# Patient Record
Sex: Female | Born: 1945 | Race: White | Hispanic: No | Marital: Married | Smoking: Never smoker
Health system: Southern US, Community
[De-identification: ages and names within clinical notes are randomized; demographics above are authoritative.]

## PROBLEM LIST (undated history)

## (undated) DIAGNOSIS — K604 Rectal fistula, unspecified: Secondary | ICD-10-CM

## (undated) DIAGNOSIS — K5792 Diverticulitis of intestine, part unspecified, without perforation or abscess without bleeding: Secondary | ICD-10-CM

## (undated) DIAGNOSIS — G43909 Migraine, unspecified, not intractable, without status migrainosus: Secondary | ICD-10-CM

## (undated) DIAGNOSIS — K219 Gastro-esophageal reflux disease without esophagitis: Secondary | ICD-10-CM

## (undated) DIAGNOSIS — D126 Benign neoplasm of colon, unspecified: Secondary | ICD-10-CM

## (undated) DIAGNOSIS — J302 Other seasonal allergic rhinitis: Secondary | ICD-10-CM

## (undated) DIAGNOSIS — M810 Age-related osteoporosis without current pathological fracture: Secondary | ICD-10-CM

## (undated) DIAGNOSIS — D333 Benign neoplasm of cranial nerves: Secondary | ICD-10-CM

## (undated) DIAGNOSIS — I10 Essential (primary) hypertension: Secondary | ICD-10-CM

## (undated) DIAGNOSIS — A0472 Enterocolitis due to Clostridium difficile, not specified as recurrent: Secondary | ICD-10-CM

## (undated) HISTORY — DX: Other seasonal allergic rhinitis: J30.2

## (undated) HISTORY — DX: Age-related osteoporosis without current pathological fracture: M81.0

## (undated) HISTORY — DX: Rectal fistula, unspecified: K60.40

## (undated) HISTORY — DX: Diverticulitis of intestine, part unspecified, without perforation or abscess without bleeding: K57.92

## (undated) HISTORY — DX: Enterocolitis due to Clostridium difficile, not specified as recurrent: A04.72

## (undated) HISTORY — DX: Benign neoplasm of cranial nerves: D33.3

## (undated) HISTORY — DX: Gastro-esophageal reflux disease without esophagitis: K21.9

## (undated) HISTORY — DX: Essential (primary) hypertension: I10

## (undated) HISTORY — PX: OOPHORECTOMY: SHX86

## (undated) HISTORY — PX: APPENDECTOMY: SHX54

## (undated) HISTORY — DX: Migraine, unspecified, not intractable, without status migrainosus: G43.909

## (undated) HISTORY — DX: Benign neoplasm of colon, unspecified: D12.6

## (undated) HISTORY — PX: ABDOMINAL HYSTERECTOMY: SHX81

## (undated) HISTORY — DX: Rectal fistula: K60.4

## (undated) HISTORY — PX: TONSILLECTOMY: SUR1361

---

## 2004-05-15 ENCOUNTER — Other Ambulatory Visit: Admission: RE | Admit: 2004-05-15 | Discharge: 2004-05-15 | Payer: Self-pay | Admitting: Gynecology

## 2006-08-05 ENCOUNTER — Other Ambulatory Visit: Admission: RE | Admit: 2006-08-05 | Discharge: 2006-08-05 | Payer: Self-pay | Admitting: Gynecology

## 2010-03-21 ENCOUNTER — Encounter: Admission: RE | Admit: 2010-03-21 | Discharge: 2010-03-21 | Payer: Self-pay | Admitting: Gynecology

## 2011-03-04 ENCOUNTER — Ambulatory Visit (INDEPENDENT_AMBULATORY_CARE_PROVIDER_SITE_OTHER): Payer: BLUE CROSS/BLUE SHIELD | Admitting: Gastroenterology

## 2011-03-04 ENCOUNTER — Encounter: Payer: Self-pay | Admitting: Gastroenterology

## 2011-03-04 VITALS — BP 121/78 | HR 80 | Temp 98.5°F | Ht 68.0 in | Wt 203.8 lb

## 2011-03-04 DIAGNOSIS — K5732 Diverticulitis of large intestine without perforation or abscess without bleeding: Secondary | ICD-10-CM

## 2011-03-04 DIAGNOSIS — K5792 Diverticulitis of intestine, part unspecified, without perforation or abscess without bleeding: Secondary | ICD-10-CM

## 2011-03-04 MED ORDER — PEG 3350-KCL-NA BICARB-NACL 420 G PO SOLR: ORAL | Status: AC

## 2011-03-04 NOTE — Patient Instructions (Signed)
Follow a high fiber diet Avoid constipation Stay away from nuts, seeds, etc. We have set you up for a colonoscopy with Dr. Jena Gauss

## 2011-03-04 NOTE — Progress Notes (Signed)
Referring Provider: Dr. Sherril Croon Primary Care Physician:  Ignatius Specking., MD, MD Primary Gastroenterologist:  Dr. Jena Gauss  Chief Complaint  Patient presents with  . Diverticulitis    diagnosed 2003    HPI:  Kaitlin May is a 65 y.o. female here as a referral from Dr. Jena Gauss for for hx of multiple episodes of diverticulitis. Clinically diagnosed in June 2011, then another occurrence in March, 2012. Last episode had RLQ pain, hurt with movement, then moved towards ruq. Time before that LLQ. Associated with diarrhea, felt like constipated, mucousy. No fevers. No brbpr, no melena. Had a CT last year and year before. Last two times saw Dr. Sherril Croon, just took abx. Did get better. Has difficulty with constipation. Uses glycerin suppositoires. Was taking prune juice, which helped. Metamucil just started a few days ago,helped.  Pain has improved significantly since given abx for recent flare: January 31, 2011.  Last colonoscopy by Dr. Gabriel Cirri March 2004 : sigmoid diverticula  Past Medical History  Diagnosis Date  . Acoustic neuroma     in situ  . HTN (hypertension)   . Migraines     uses demerol and phenergan for migraines due to severity of pain, nausea  . Seasonal allergies   . GERD (gastroesophageal reflux disease)     Past Surgical History  Procedure Date  . Partial hysterectomy   . Tonsillectomy   . Appendectomy   . 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 butalbital-acetaminophen-caffeine (FIORICET, ESGIC) 50-325-40 MG per tablet Take 2 tablets by mouth as needed.        . cetirizine (ZYRTEC) 10 MG chewable tablet Chew 10 mg by mouth as needed.        . fluticasone (FLONASE) 50 MCG/ACT nasal spray 1 spray by Nasal route daily.        Marland Kitchen LORazepam (ATIVAN) 1 MG tablet Take 1 mg by mouth daily as needed.        . meperidine (DEMEROL) 50 MG tablet Take 50 mg by mouth every 4 (four)  hours as needed.        . promethazine (PHENERGAN) 25 MG suppository Place 25 mg rectally every 6 (six) hours as needed.        . lansoprazole (PREVACID) 30 MG capsule Take 30 mg by mouth daily.          Allergies as of 03/04/2011 - Review Complete 03/04/2011  Allergen Reaction Noted  . Penicillins Palpitations 03/04/2011    Family History  Problem Relation Age of Onset  . Melanoma Father     deceased at 79  . Diabetes Mother   . Dementia Mother     deceased at 5  . Colon cancer Neg Hx     History   Social History  . Marital Status: Married    Spouse Name: N/A    Number of Children: N/A  . Years of Education: N/A   Occupational History  . Not on file.   Social History Main Topics  . Smoking status: Never Smoker   . Smokeless tobacco: Never Used  . Alcohol Use: No  . Drug Use: No  . Sexually Active: Not Currently -- Female partner(s)    Birth Control/ Protection: None     spouse     Review of Systems: Gen: Denies any fever, chills, sweats, anorexia, fatigue, weakness, malaise, weight loss, and sleep disorder CV: Denies chest  pain, angina, palpitations, syncope, orthopnea, PND, peripheral edema, and claudication. Resp: Denies dyspnea at rest, dyspnea with exercise, cough, sputum, wheezing, coughing up blood, and pleurisy. GI: Denies vomiting blood, jaundice, and fecal incontinence.   Denies dysphagia or odynophagia. GU : Denies urinary burning, blood in urine, urinary frequency, urinary hesitancy, nocturnal urination, and urinary incontinence. MS: Denies joint pain, limitation of movement, and swelling, stiffness, low back pain, extremity pain. Denies muscle weakness, cramps, atrophy.  Derm: Denies rash, itching, dry skin, hives, moles, warts, or unhealing ulcers.  Psych: Denies depression, anxiety, memory loss, suicidal ideation, hallucinations, paranoia, and confusion. Heme: Denies bruising, bleeding, and enlarged lymph nodes.  Physical Exam: BP 121/78  Pulse 80   Temp 98.5 F (36.9 C)  Ht 5\' 8"  (1.727 m)  Wt 203 lb 12.8 oz (92.443 kg)  BMI 30.99 kg/m2 General:   Alert,  Well-developed, well-nourished, pleasant and cooperative in NAD Head:  Normocephalic and atraumatic. Eyes:  Sclera clear, no icterus.   Conjunctiva pink. Ears:  Normal auditory acuity. Nose:  No deformity, discharge,  or lesions. Mouth:  No deformity or lesions, dentition normal. Neck:  Supple; no masses or thyromegaly. Lungs:  Clear throughout to auscultation.   No wheezes, crackles, or rhonchi. No acute distress. Heart:  Regular rate and rhythm; no murmurs, clicks, rubs,  or gallops. Abdomen:  Soft, very mildly tender RLA and nondistended. No masses, hepatosplenomegaly or hernias noted. Normal bowel sounds, without guarding, and without rebound.   Rectal:  Deferred until time of colonoscopy.   Msk:  Symmetrical without gross deformities. Normal posture. Extremities:  Without clubbing or edema. Neurologic:  Alert and  oriented x4;  grossly normal neurologically. Skin:  Intact without significant lesions or rashes. Cervical Nodes:  No significant cervical adenopathy. Psych:  Alert and cooperative. Normal mood and affect.

## 2011-03-07 DIAGNOSIS — K5792 Diverticulitis of intestine, part unspecified, without perforation or abscess without bleeding: Secondary | ICD-10-CM | POA: Insufficient documentation

## 2011-03-07 NOTE — Assessment & Plan Note (Signed)
Multiple episodes in past of diverticulitis, most recent episode mid-March. Significantly improved with abx. Colonoscopy in 2004 with Dr. Gabriel Cirri with findings of sigmoid diverticula. Doing well currently; now needs updated colonoscopy to r/o any underlying processes. Will proceed with TCS with Dr. Jena Gauss. High fiber diet, avoid constipation.   Proceed with TCS with Dr. Jena Gauss in near future: the risks, benefits, and alternatives have been discussed with the patient in detail. The patient states understanding and desires to proceed.

## 2011-03-08 NOTE — Progress Notes (Signed)
Cc to PCP 

## 2011-03-25 ENCOUNTER — Other Ambulatory Visit: Payer: Self-pay | Admitting: Internal Medicine

## 2011-03-25 ENCOUNTER — Ambulatory Visit (HOSPITAL_COMMUNITY)
Admission: RE | Admit: 2011-03-25 | Discharge: 2011-03-25 | Disposition: A | Discharge status: Home / Self Care | Payer: BC Managed Care – PPO | Source: Ambulatory Visit | Attending: Internal Medicine | Admitting: Internal Medicine

## 2011-03-25 ENCOUNTER — Encounter: Payer: BC Managed Care – PPO | Admitting: Internal Medicine

## 2011-03-25 DIAGNOSIS — I10 Essential (primary) hypertension: Secondary | ICD-10-CM | POA: Insufficient documentation

## 2011-03-25 DIAGNOSIS — K5732 Diverticulitis of large intestine without perforation or abscess without bleeding: Secondary | ICD-10-CM

## 2011-03-25 DIAGNOSIS — D126 Benign neoplasm of colon, unspecified: Secondary | ICD-10-CM

## 2011-03-25 DIAGNOSIS — Z79899 Other long term (current) drug therapy: Secondary | ICD-10-CM | POA: Insufficient documentation

## 2011-03-25 DIAGNOSIS — Z792 Long term (current) use of antibiotics: Secondary | ICD-10-CM

## 2011-03-25 DIAGNOSIS — K5730 Diverticulosis of large intestine without perforation or abscess without bleeding: Secondary | ICD-10-CM | POA: Insufficient documentation

## 2011-03-25 HISTORY — PX: COLONOSCOPY: SHX174

## 2011-03-25 HISTORY — DX: Benign neoplasm of colon, unspecified: D12.6

## 2011-03-26 NOTE — Op Note (Signed)
  NAME:  Kaitlin May, Kaitlin May              ACCOUNT NO.:  [id number]  MEDICAL RECORD NO.:  [id number]           PATIENT TYPE:  O  LOCATION:  [facility]                          FACILITY:  [facility]  PHYSICIAN:  R. Roetta Sessions, M.D. DATE OF BIRTH:  10/26/1946  DATE OF PROCEDURE:  03/25/2011 DATE OF DISCHARGE:                              OPERATIVE REPORT   PROCEDURE:  Colonoscopy with biopsy.  INDICATIONS FOR PROCEDURE:  A 65 year old lady with history of recurrent diverticulitis, been treated with multiple rounds of antibiotics over time.  She has had multiple CTs however, it is not clear to me whether or not diverticulitis was ever documented.  Last colonoscopy in 2004. She was seen in our office on March 04, 2011.  She was started on daily Metamucil based on office visit recommendations associated with marked improvement in bowel function and [name], tells me she is feeling very well these days.  No family history of colon polyps or colon cancer.  Colonoscopy now being done.  Risks, benefits, limitations, alternatives, imponderables have been discussed, questions answered. Please see the documentation in the medical record.  PROCEDURE NOTE:  O2 saturation, blood pressure, pulse, respirations were monitored throughout the entire procedure.  CONSCIOUS SEDATION:  Versed 6 mg IV, Demerol 100 mg IV in divided doses.  INSTRUMENT:  Pentax video chip system.  FINDINGS:  Digital rectal exam revealed no abnormalities.  Endoscopic findings:  Prep was adequate.  Colon:  Colonic mucosa was surveyed from the rectosigmoid junction through the left transverse right colon to the appendiceal orifice, ileocecal valve/cecum.  These structures were well seen and photographed for the record.  From this level, scope was slowly and cautiously withdrawn.  All previously mentioned mucosal surfaces were again seen.  The patient had extensive left-sided diverticula. There were two diminutive polyps in the  descending segments were cold biopsied/removed.  Remainder of colonic mucosa appeared normal.  Scope was pulled down into rectum where a thorough examination of rectal mucosa including retroflexed view of the anal verge demonstrated no abnormalities.  The patient tolerated the procedure well.  Cecal withdrawal time 8 minutes.  IMPRESSION: 1. Normal rectum. 2. Left-sided diverticula, diminutive ascending colon polyp with     status post cold biopsy removal.  Remainder of colonic mucosa     appeared normal.  RECOMMENDATIONS: 1. Continue substantial daily fiber supplement with Metamucil     indefinitely. 2. Follow up on path. 3. Further recommendations to follow.     Jonathon Bellows, M.D.     RMR/MEDQ  D:  03/25/2011  T:  03/25/2011  Job:  [id number]  cc:   Doreen Beam, MD Fax: [phone]  Electronically Signed by Lorrin Goodell M.D. on 03/26/2011 10:57:49 AM

## 2011-10-30 ENCOUNTER — Ambulatory Visit
Admission: RE | Admit: 2011-10-30 | Discharge: 2011-10-30 | Disposition: A | Discharge status: Home / Self Care | Payer: BC Managed Care – PPO | Source: Ambulatory Visit | Attending: Gynecology | Admitting: Gynecology

## 2011-10-30 ENCOUNTER — Other Ambulatory Visit: Payer: Self-pay | Admitting: Gynecology

## 2011-10-30 DIAGNOSIS — Z1231 Encounter for screening mammogram for malignant neoplasm of breast: Secondary | ICD-10-CM

## 2012-03-24 ENCOUNTER — Ambulatory Visit (INDEPENDENT_AMBULATORY_CARE_PROVIDER_SITE_OTHER): Payer: BC Managed Care – PPO | Admitting: Gastroenterology

## 2012-03-24 ENCOUNTER — Encounter: Payer: Self-pay | Admitting: Gastroenterology

## 2012-03-24 VITALS — BP 112/78 | HR 77 | Temp 97.6°F | Ht 68.0 in | Wt 206.6 lb

## 2012-03-24 DIAGNOSIS — K5792 Diverticulitis of intestine, part unspecified, without perforation or abscess without bleeding: Secondary | ICD-10-CM

## 2012-03-24 DIAGNOSIS — R109 Unspecified abdominal pain: Secondary | ICD-10-CM

## 2012-03-24 DIAGNOSIS — R1030 Lower abdominal pain, unspecified: Secondary | ICD-10-CM

## 2012-03-24 DIAGNOSIS — K5732 Diverticulitis of large intestine without perforation or abscess without bleeding: Secondary | ICD-10-CM

## 2012-03-24 DIAGNOSIS — R197 Diarrhea, unspecified: Secondary | ICD-10-CM | POA: Insufficient documentation

## 2012-03-24 NOTE — Progress Notes (Signed)
Faxed to PCP

## 2012-03-24 NOTE — Assessment & Plan Note (Signed)
History of recurrent diverticulitis, sounds like mostly clinical diagnosis. I will request copies of her prior CT scans done at [facility] for the past 15 years. We need to document how any episodes of diverticulitis she has had CT evidence of. Clearly her most recent GI symptoms likely not acute diverticulitis.

## 2012-03-24 NOTE — Progress Notes (Signed)
Primary Care Physician: Ignatius Specking., MD, MD  Primary Gastroenterologist:  Roetta Sessions, MD   Chief Complaint  Patient presents with  . Abdominal Pain    2 weeks ago  . Diarrhea    2 weeks ago   . Rectal Bleeding    2 weeks ago    HPI: Kaitlin May is a 66 y.o. female here for further evaluation of recent acute onset diarrhea and lower abdominal pain. She has h/o recurrent diverticulitis, clinically diagnosed to best of her knowledge. Recent CT A/P failed to show any colon wall thickening or diverticulitis.   She works at [employer]. 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 Several workers became ill as well. She did not get sick at that time. However, two weeks ago, stomach griping and hourly diarrhea throughout night. Started on April 23rd, Tuesday night. Saw Dr. Sherril Croon on the 24th, given Levaquin for suspected diverticulitis. Migraine headache started and lots of heaving. She ended up in ED.  Did CT Abd while there. No diverticulitis or anything to explain symptoms. Had not started Levaquin at that time, but went ahead and started after ED visit. Diarrhea lasted for over one week. Pain persistent and unable to eat solid foods. Medication changed to augmentin on 03/18/12 for ?infectious colitis. Patient given levsin (patient did not use). Last Thursday appetite returning. Some brbpr on toilet tissue, bloody mucous. Mostly lower abdominal pain. Stools are more formed. Chronically has to use glycerin suppository to get stool started. No more bleeding. Pain is better. Has multiple questions of whether her abdominal pain was related to her right ovary, hernia. Multiple questions about possibility of cystocele, rectocele.   Current Outpatient Prescriptions  Medication Sig Dispense Refill  . butalbital-acetaminophen-caffeine (FIORICET, ESGIC) 50-325-40 MG per tablet Take 2 tablets by mouth as needed.        . cetirizine (ZYRTEC) 10 MG chewable  tablet Chew 10 mg by mouth as needed.        . fluticasone (FLONASE) 50 MCG/ACT nasal spray 1 spray by Nasal route daily.        Marland Kitchen glycerin adult (GLYCERIN ADULT) 2 G SUPP Place 1 suppository rectally once as needed.      . lansoprazole (PREVACID) 30 MG capsule Take 30 mg by mouth daily.        Marland Kitchen LORazepam (ATIVAN) 1 MG tablet Take 1 mg by mouth daily as needed.        . meperidine (DEMEROL) 50 MG tablet Take 50 mg by mouth every 4 (four) hours as needed.        . promethazine (PHENERGAN) 25 MG suppository Place 25 mg rectally every 6 (six) hours as needed.        . psyllium (METAMUCIL) 58.6 % packet Take 1 packet by mouth daily.        Allergies as of 03/24/2012 - Review Complete 03/24/2012  Allergen Reaction Noted  . Penicillins Palpitations 03/04/2011    ROS:  General: Negative for anorexia, weight loss, fever, chills, fatigue, weakness. ENT: Negative for hoarseness, difficulty swallowing , nasal congestion. CV: Negative for chest pain, angina, palpitations, dyspnea on exertion, peripheral edema.  Respiratory: Negative for dyspnea at rest, dyspnea on exertion, cough, sputum, wheezing.  GI: See history of present illness. GU:  Negative for dysuria, hematuria, urinary incontinence, urinary frequency, nocturnal urination.  Endo: Negative for unusual weight change.    Physical Examination:   BP 112/78  Pulse 77  Temp(Src) 97.6 F (36.4  C) (Temporal)  Ht 5\' 8"  (1.727 m)  Wt 206 lb 9.6 oz (93.713 kg)  BMI 31.41 kg/m2  General: Well-nourished, well-developed in no acute distress. Accompanied by husband. Eyes: No icterus. Mouth: Oropharyngeal mucosa moist and pink , no lesions erythema or exudate. Lungs: Clear to auscultation bilaterally.  Heart: Regular rate and rhythm, no murmurs rubs or gallops.  Abdomen: Bowel sounds are normal, mild lower bowel tenderness more on the right side, nondistended, no hepatosplenomegaly or masses, no abdominal bruits or hernia , no rebound or  guarding.   Extremities: No lower extremity edema. No clubbing or deformities. Neuro: Alert and oriented x 4   Skin: Warm and dry, no jaundice.   Psych: Alert and cooperative, normal mood and affect.  Labs:  Requested records from [facility]  Imaging Studies: CT of abdomen and pelvis with contrast at [facility] on 03/11/2012 showed fatty liver, diverticulosis of the sigmoid colon without diverticulitis.

## 2012-03-24 NOTE — Assessment & Plan Note (Signed)
Recent acute onset diarrhea associated with lower abdominal discomfort, couple episodes of small-volume bright red blood noted on the toilet tissue towards the end of the illness. Diarrhea last for about one week. Anorexia resolved after 10 days. She was treated initially with Levaquin for suspected diverticulitis although her CT scan done in the emergency department was unremarkable. With protracted symptoms she was switched to Augmentin. At this point she is slowly returning to normal. I suspect she had a viral gastroenteritis. Cannot rule out infectious colitis but at least her colon appeared normal on CT suggesting self-limited disease. At this point I do not feel that she needs to have a colonoscopy. She had one 1 year ago. Her symptoms are resolving. If she were to have persistent diarrhea, I would recommend checking stool culture, Giardia/Cryptosporidium, C. Difficile PCR. Will add probiotic one daily. She is started on some over-the-counter which she can use. She'll come back in 4 weeks for reevaluation.

## 2012-03-24 NOTE — Patient Instructions (Signed)
Please take a probiotic daily for the next 4 weeks. The one you have purchased will be fine. Gradually add back fiber to your diet. I will look at your prior CT reports. Office visit in 4 weeks for followup and to discuss prior CT findings.

## 2012-04-08 ENCOUNTER — Encounter: Payer: Self-pay | Admitting: Gastroenterology

## 2012-04-21 ENCOUNTER — Ambulatory Visit: Payer: BC Managed Care – PPO | Admitting: Gastroenterology

## 2012-04-28 ENCOUNTER — Encounter: Payer: Self-pay | Admitting: Gastroenterology

## 2012-04-28 ENCOUNTER — Ambulatory Visit (INDEPENDENT_AMBULATORY_CARE_PROVIDER_SITE_OTHER): Payer: Medicare Other | Admitting: Gastroenterology

## 2012-04-28 VITALS — BP 125/71 | HR 75 | Temp 97.7°F | Ht 68.0 in | Wt 208.4 lb

## 2012-04-28 DIAGNOSIS — K5900 Constipation, unspecified: Secondary | ICD-10-CM

## 2012-04-28 DIAGNOSIS — K6289 Other specified diseases of anus and rectum: Secondary | ICD-10-CM | POA: Insufficient documentation

## 2012-04-28 DIAGNOSIS — K5909 Other constipation: Secondary | ICD-10-CM | POA: Insufficient documentation

## 2012-04-28 DIAGNOSIS — Z86010 Personal history of colonic polyps: Secondary | ICD-10-CM

## 2012-04-28 DIAGNOSIS — K5730 Diverticulosis of large intestine without perforation or abscess without bleeding: Secondary | ICD-10-CM

## 2012-04-28 MED ORDER — HYDROCORTISONE ACETATE 25 MG RE SUPP: 25.0000 mg | Freq: Two times a day (BID) | RECTAL | Status: AC

## 2012-04-28 MED ORDER — LINACLOTIDE 145 MCG PO CAPS: 1.0000 | ORAL_CAPSULE | Freq: Every day | ORAL | Status: DC

## 2012-04-28 NOTE — Patient Instructions (Signed)
Please use anusol suppositories twice daily for two weeks. You may repeat for two additional weeks if needed. This is to help heal a small tear in your anal area. Please try Linzess one capsule 30 minutes before breakfast for constipation. We have provided you with samples. Call for RX if you find helpful. It is important to keep your stools soft.  Continue Metamucil or any fiber supplement given history of diverticulosis.  Call with questions.  Office visit as needed.   Constipation in Adults Constipation is having fewer than 2 bowel movements per week. Usually, the stools are hard. As we grow older, constipation is more common. If you try to fix constipation with laxatives, the problem may get worse. This is because laxatives taken over a long period of time make the colon muscles weaker. A low-fiber diet, not taking in enough fluids, and taking some medicines may make these problems worse. MEDICATIONS THAT MAY CAUSE CONSTIPATION  Water pills (diuretics).   Calcium channel blockers (used to control blood pressure and for the heart).   Certain pain medicines (narcotics).   Anticholinergics.   Anti-inflammatory agents.   Antacids that contain aluminum.  DISEASES THAT CONTRIBUTE TO CONSTIPATION  Diabetes.   Parkinson's disease.   Dementia.   Stroke.   Depression.   Illnesses that cause problems with salt and water metabolism.  HOME CARE INSTRUCTIONS   Constipation is usually best cared for without medicines. Increasing dietary fiber and eating more fruits and vegetables is the best way to manage constipation.   Slowly increase fiber intake to 25 to 38 grams per day. Whole grains, fruits, vegetables, and legumes are good sources of fiber. A dietitian can further help you incorporate high-fiber foods into your diet.   Drink enough water and fluids to keep your urine clear or pale yellow.   A fiber supplement may be added to your diet if you cannot get enough fiber from foods.     Increasing your activities also helps improve regularity.   Suppositories, as suggested by your caregiver, will also help. If you are using antacids, such as aluminum or calcium containing products, it will be helpful to switch to products containing magnesium if your caregiver says it is okay.   If you have been given a liquid injection (enema) today, this is only a temporary measure. It should not be relied on for treatment of longstanding (chronic) constipation.   Stronger measures, such as magnesium sulfate, should be avoided if possible. This may cause uncontrollable diarrhea. Using magnesium sulfate may not allow you time to make it to the bathroom.  SEEK IMMEDIATE MEDICAL CARE IF:   There is bright red blood in the stool.   The constipation stays for more than 4 days.   There is belly (abdominal) or rectal pain.   You do not seem to be getting better.   You have any questions or concerns.  MAKE SURE YOU:   Understand these instructions.   Will watch your condition.   Will get help right away if you are not doing well or get worse.  Document Released: 08/02/2004 Document Revised: 10/24/2011 Document Reviewed: 10/08/2011 [facility] Patient Information 2012 [place], [place].

## 2012-04-28 NOTE — Progress Notes (Signed)
**Note Kaitlin-Identified via Obfuscation** Primary Care Physician: Ignatius Specking., MD, MD  Primary Gastroenterologist: Roetta Sessions, MD   Chief Complaint  Patient presents with  . Follow-up    HPI: Kaitlin May is a 66 y.o. female here for 4 week f/u. Last seen 03/2012 for acute onset diarrhea, lower abd pain. Initially treated for diverticulitis by PCP but CT negative. Abx switched for ?colitis. By the time she was seen in 03/2012, she was starting to feel better.   I obtained copies of her prior CTs dating back to 2004. She had no evidence of diverticulitis on CTs in 12/2002, 04/2008, 02/2010, 02/2012. In 03/2009, she had CT evidence of sigmoid diverticulitis.   Patient states she is back to chronic constipation. She is taking Metamucil daily and using glycerin suppository each day to have BM. She c/o anal pain without itching or bleeding. Notices at time of hard stool. No problems with GERD, abdominal pain. Multiple questions answered regarding her prior TCS findings.   Current Outpatient Prescriptions  Medication Sig Dispense Refill  . butalbital-acetaminophen-caffeine (FIORICET, ESGIC) 50-325-40 MG per tablet Take 2 tablets by mouth as needed.        . cetirizine (ZYRTEC) 10 MG chewable tablet Chew 10 mg by mouth as needed.        . fluticasone (FLONASE) 50 MCG/ACT nasal spray 1 spray by Nasal route daily.        Marland Kitchen glycerin adult (GLYCERIN ADULT) 2 G SUPP Place 1 suppository rectally once as needed.      . lansoprazole (PREVACID) 30 MG capsule Take 30 mg by mouth daily.        Marland Kitchen LORazepam (ATIVAN) 1 MG tablet Take 1 mg by mouth daily as needed.        . promethazine (PHENERGAN) 25 MG suppository Place 25 mg rectally every 6 (six) hours as needed.        . psyllium (METAMUCIL) 58.6 % packet Take 1 packet by mouth daily.        Allergies as of 04/28/2012 - Review Complete 04/28/2012  Allergen Reaction Noted  . Penicillins Palpitations 03/04/2011    ROS:  General: Negative for anorexia, weight loss, fever, chills, fatigue,  weakness. ENT: Negative for hoarseness, difficulty swallowing , nasal congestion. CV: Negative for chest pain, angina, palpitations, dyspnea on exertion, peripheral edema.  Respiratory: Negative for dyspnea at rest, dyspnea on exertion, cough, sputum, wheezing.  GI: See history of present illness. GU:  Negative for dysuria, hematuria, urinary incontinence, urinary frequency, nocturnal urination.  Endo: Negative for unusual weight change.    Physical Examination:   BP 125/71  Pulse 75  Temp(Src) 97.7 F (36.5 C) (Temporal)  Ht 5\' 8"  (1.727 m)  Wt 208 lb 6.4 oz (94.53 kg)  BMI 31.69 kg/m2  General: Well-nourished, well-developed in no acute distress.  Eyes: No icterus. Mouth: Oropharyngeal mucosa moist and pink , no lesions erythema or exudate. Lungs: Clear to auscultation bilaterally.  Heart: Regular rate and rhythm, no murmurs rubs or gallops.  Abdomen: Bowel sounds are normal, nontender, nondistended, no hepatosplenomegaly or masses, no abdominal bruits or hernia , no rebound or guarding.   Rectal: skin tag noted anteriorly. No significant pain with DRE. No stool present.  Extremities: No lower extremity edema. No clubbing or deformities. Neuro: Alert and oriented x 4   Skin: Warm and dry, no jaundice.   Psych: Alert and cooperative, normal mood and affect.

## 2012-04-28 NOTE — Assessment & Plan Note (Signed)
Continue metamucil. Add Linzess daily. #14 samples provided. She will call for RX if works for her. Goal would be to eliminate need for daily glycerin suppositories.

## 2012-04-28 NOTE — Assessment & Plan Note (Signed)
Suspect small fissure vs skin tag irritation. Anusol supp BID for two weeks. RX with one refill given.

## 2012-04-28 NOTE — Progress Notes (Signed)
Faxed to PCP

## 2012-04-28 NOTE — Assessment & Plan Note (Signed)
Patient recalls being treated multiple times for diverticulitis, sometimes empirically. Single CT documented episode of sigmoid diverticulitis. At this point, no indication for surgical intervention. She will let us know if she is having recurrent abdominal pain.

## 2012-09-25 ENCOUNTER — Other Ambulatory Visit: Payer: Self-pay | Admitting: Gynecology

## 2012-09-25 DIAGNOSIS — Z1231 Encounter for screening mammogram for malignant neoplasm of breast: Secondary | ICD-10-CM

## 2012-10-30 ENCOUNTER — Other Ambulatory Visit: Payer: Self-pay | Admitting: Gynecology

## 2012-10-30 ENCOUNTER — Ambulatory Visit
Admission: RE | Admit: 2012-10-30 | Discharge: 2012-10-30 | Disposition: A | Discharge status: Home / Self Care | Payer: BC Managed Care – PPO | Source: Ambulatory Visit | Attending: Gynecology | Admitting: Gynecology

## 2012-10-30 DIAGNOSIS — Z1231 Encounter for screening mammogram for malignant neoplasm of breast: Secondary | ICD-10-CM

## 2014-11-16 ENCOUNTER — Encounter: Payer: Self-pay | Admitting: Internal Medicine

## 2014-12-06 ENCOUNTER — Telehealth: Payer: Self-pay | Admitting: Internal Medicine

## 2014-12-06 NOTE — Telephone Encounter (Signed)
If PCP feels this is an urgent issue, then have her see [place] in an urgent slot. I agree she needs to be seen prior to any further recommendations.

## 2014-12-06 NOTE — Telephone Encounter (Signed)
Patient called to see if she could be seen sooner than 2/8 with LSL. Patient cancelled her OV for 1/26 with EG because she wants to see RMR or LSL only.  Patient said that she was taking Flagyl for one week and then was put on Vancomycin liquid and has been taking that for 8 days. She said that she was positive for C diff and it's not getting any better and she is hurting bad enough where she can't sleep at night. Please advise if we can use an URG spot or what recommendations RMR would have for her. Please call [phone]

## 2014-12-06 NOTE — Telephone Encounter (Signed)
I spoke with the pt- she was diagnosed by Dr.Vyas with Cdiff. She has taken flagyl and is currently on vancomycin. She had a ct done at [facility] in November, because they thought she had diverticulitis and the ct was negative. She said last night was a bad night. I advised her that we have not seen her since 2013 and we needed to get her in to be seen but if she was having problems prior to her coming in she should contact her pcp who started her on treatment. Advised pt that we have an opening next week with Randall Hiss, NP. She said she didn't know him and he wasn't a doctor. I informed her that he was new to the practice and that LSL was a PA and not a doctor and out of the office until the middle of next week. She said she wanted to see someone she has seen before, pt has seen AS in 2012. She said it was ok to schedule with her. I told her that I would talk to Manuela Schwartz and if we have any cancellations that we could try to get her in sooner with either LSL or AS  but while she is waiting she should call her pcp. Pt verbalized understanding.

## 2014-12-13 ENCOUNTER — Ambulatory Visit: Payer: Medicare Other | Admitting: Nurse Practitioner

## 2014-12-26 ENCOUNTER — Ambulatory Visit (INDEPENDENT_AMBULATORY_CARE_PROVIDER_SITE_OTHER): Payer: Medicare Other | Admitting: Gastroenterology

## 2014-12-26 ENCOUNTER — Encounter: Payer: Self-pay | Admitting: Gastroenterology

## 2014-12-26 VITALS — BP 129/75 | HR 80 | Temp 97.8°F | Ht 67.0 in | Wt 186.4 lb

## 2014-12-26 DIAGNOSIS — Z8619 Personal history of other infectious and parasitic diseases: Secondary | ICD-10-CM | POA: Insufficient documentation

## 2014-12-26 DIAGNOSIS — R1030 Lower abdominal pain, unspecified: Secondary | ICD-10-CM

## 2014-12-26 MED ORDER — HYDROCORTISONE ACETATE 25 MG RE SUPP: 25.0000 mg | Freq: Two times a day (BID) | RECTAL | Status: DC | PRN

## 2014-12-26 NOTE — Progress Notes (Signed)
Primary Care Physician:  Glenda Chroman., MD  Primary Gastroenterologist:  Garfield Cornea, MD   Chief Complaint  Patient presents with  . Abdominal Pain  . Diarrhea    HPI:  Kaitlin May is a 69 y.o. female here for further evaluation of abdominal pain and recent C.Diff. She has h/o recurrent abdominal pain, treated empirically numerous times in the past for diverticulitis although I have only seen positive CT on one occasion in 2010. Last CT 09/12/2014 as below.   Last seen by our practice in June 2013. Weight is down 20 pounds since that time. Patient states she had a tick bite in 04/2014, took Doxycycline for 10 days. Daughter also admitted with C.diff around that time. Daughter had Cdiff off/on from at least July to September. She started having abdominal pain in 06/2014. Two rounds of cipro/flagyl for suspected diverticulitis. No improvement. Eventually had CT 08/2014. 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 Given bentyl shortly after that CT.   Abdominal pain persistent. Unable to sleep or eat for months. Weight down 20 pounds. Nocturnal sweats. Actually chronic constipation persisted. Was on metamucil, miralax, glycerin suppositories. Towards end of December stools were softer. C.diff positive. Initially treated with Flagyl for one week but no improvement in pain so switched to oral liquid vancomycin for 14 days. Pain improved until couple of days ago. Stools soft and daily without requirement for laxatives as in past but no frank diarrhea at any point. No melena. She has had some brbpr with wiping. 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 Drains stool intermittently. Never had surgery although was offered years ago. Today complains of two day h/o pelvic pain worse with movement. No vomiting or fever. No  heartburn. Daily BM, stools are solid to mushy.    Current Outpatient Prescriptions  Medication Sig Dispense Refill  . butalbital-acetaminophen-caffeine (FIORICET, ESGIC) 50-325-40 MG per tablet Take 2 tablets by mouth as needed.      . dicyclomine (BENTYL) 10 MG capsule     . lansoprazole (PREVACID) 30 MG capsule Take 30 mg by mouth daily.      Marland Kitchen LORazepam (ATIVAN) 1 MG tablet Take 1 mg by mouth daily as needed.      . promethazine (PHENERGAN) 25 MG suppository Place 25 mg rectally every 6 (six) hours as needed.      .     1   No current facility-administered medications for this visit.    Allergies as of 12/26/2014 - Review Complete 12/26/2014  Allergen Reaction Noted  . Penicillins Palpitations 03/04/2011    Past Medical History  Diagnosis Date  . Acoustic neuroma     in situ  . HTN (hypertension)   . Migraines     uses demerol and phenergan for migraines due to severity of pain, nausea  . Seasonal allergies   . GERD (gastroesophageal reflux disease)   . 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 Gala Romney  . Diverticula of colon   . Diverticulitis      Negative CTs in 2004, 2009, 2011, 2013. Positive for sigmoid diverticulitis in 03/2009    Past Surgical History  Procedure Laterality Date  . Partial hysterectomy    . Tonsillectomy    . Appendectomy    . Rectal fistula repair      some type of  repair, pt still reports rectal fistula  . Colonoscopy  03/25/11    Dr. Elwyn Reach sided diverticula, normal rectum, tubular adenoma. Due for next TCS 03/2018.     Family History  Problem Relation Age of Onset  . Melanoma Father     deceased at 3  . Diabetes Mother   . Dementia Mother     deceased at 56  . Colon cancer Neg Hx     History   Social History  . Marital Status: Married    Spouse Name: N/A    Number of Children: N/A  . Years of Education: N/A   Occupational History  . Not on file.   Social History Main Topics  . Smoking status: Never Smoker   .  Smokeless tobacco: Never Used  . Alcohol Use: No  . Drug Use: No  . Sexual Activity:    Partners: Male    Birth Control/ Protection: None     Comment: spouse   Other Topics Concern  . Not on file   Social History Narrative      ROS:  General: no fever or chills. See hpi. Eyes: Negative for vision changes.  ENT: Negative for hoarseness, difficulty swallowing , nasal congestion. CV: Negative for chest pain, angina, palpitations, dyspnea on exertion, peripheral edema.  Respiratory: Negative for dyspnea at rest, dyspnea on exertion, cough, sputum, wheezing.  GI: See history of present illness. GU:  Negative for dysuria, hematuria, urinary incontinence, urinary frequency, nocturnal urination.  MS: Negative for joint pain, low back pain.  Derm: Negative for rash or itching.  Neuro: Negative for weakness, abnormal sensation, seizure, frequent headaches, memory loss, confusion.  Psych: Negative for anxiety, depression, suicidal ideation, hallucinations.  Endo: Negative for unusual weight change.  Heme: Negative for bruising or bleeding. Allergy: Negative for rash or hives.    Physical Examination:  BP 129/75 mmHg  Pulse 80  Temp(Src) 97.8 F (36.6 C) (Oral)  Ht 5\' 7"  (1.702 m)  Wt 186 lb 6.4 oz (84.55 kg)  BMI 29.19 kg/m2   General: Well-nourished, well-developed in no acute distress. Accompanied by husband. Head: Normocephalic, atraumatic.   Eyes: Conjunctiva pink, no icterus. Mouth: Oropharyngeal mucosa moist and pink , no lesions erythema or exudate. Neck: Supple without thyromegaly, masses, or lymphadenopathy.  Lungs: Clear to auscultation bilaterally.  Heart: Regular rate and rhythm, no murmurs rubs or gallops.  Abdomen: Bowel sounds are normal, mild lower abdominal tenderness, nondistended, no hepatosplenomegaly or masses, no abdominal bruits or    hernia , no rebound or guarding.   Rectal: small nodular scarring between vaginal introitus and anus. 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 Stool in rectal vault, soft. No gross blood per rectum.  Extremities: No lower extremity edema. No clubbing or deformities.  Neuro: Alert and oriented x 4 , grossly normal neurologically.  Skin: Warm and dry, no rash or jaundice.   Psych: Alert and cooperative, normal mood and affect.  Labs: Labs dated 11/01/2014. BUN 12, creatinine 0.91, total bilirubin 0.3, alkaline phosphatase 131, AST 15, ALT 10, albumin 4.8, white blood cell count 5200, hemoglobin 13.8, hematocrit 41.2, platelets 329,000 TSH 1.430  Imaging Studies: No results found.

## 2014-12-26 NOTE — Patient Instructions (Addendum)
1. Please collect stool specimen and drop off at lab. 2. Please clorox your bathroom after each use until we know what your results are. Change your toothbrushes as well. 3. Continue a probiotic for one more month.  4. Take Metamucil daily, hold for diarrhea.  5. We will call you with results and further instructions.

## 2014-12-27 NOTE — Assessment & Plan Note (Signed)
69 y/o female with recurrent lower abdominal pain over the years who has been treated empirically for diverticulitis on numerous occasions. Recently has had lower abdominal pain associated with 20 pound weight loss, some change in stool consistency but no frank diarrhea. She has had contact with daughter who had refractory Cdiff in summer/fall. She has had multiple courses of abx. Recently found to have Cdiff per patient although I have not seen the results. She was treated with flagyl and then liquid vanc. Symptoms improved until 2 weeks after competing vanc. First and foremost, would recheck Cdiff PCR. Continue probiotic for now. She had no evidence of active fistula on exam today but complains of fistula for decades with recent drainage of stool as described above. To discuss further with Dr. Gala Romney.

## 2014-12-27 NOTE — Progress Notes (Signed)
cc'ed to pcp °

## 2014-12-28 ENCOUNTER — Telehealth: Payer: Self-pay | Admitting: Internal Medicine

## 2014-12-28 NOTE — Telephone Encounter (Signed)
Good to hear

## 2014-12-28 NOTE — Telephone Encounter (Signed)
PATIENT CAME IN OFFICE TO NOTIFY LSL THAT SHE JUST DROPPED OFF HER STOOL SAMPLE AT THE LAB

## 2014-12-28 NOTE — Telephone Encounter (Signed)
Pt took cdiff sample to the lab. Results are not done yet.

## 2014-12-28 NOTE — Telephone Encounter (Signed)
Routing to LSL for FYI 

## 2014-12-29 ENCOUNTER — Other Ambulatory Visit: Payer: Self-pay | Admitting: Gastroenterology

## 2014-12-29 LAB — CLOSTRIDIUM DIFFICILE BY PCR: CDIFFPCR: DETECTED — AB

## 2014-12-29 NOTE — Progress Notes (Signed)
Quick Note:  Please let patient know her Cdiff is positive. She needs vancomycin with taper. RX done. Also needs Florastor 250mg  BID continue until one month after off of vancomycin. No PPIs while on Vancomycin. May use TUMS/Rolaids. We discussed Cdiff precautions while in the office including new toothbrushes, clorox based cleaning of bathroom, soap for handwashing NOT hand sanitizer. Return to the office in 3 weeks.   Please call in RX: Vancomycin 125mg  po QID for 14 days, 125mg  po BID for 7 days, 125mg  po daily for 7 days, 125mg  po QOD for 7 days, 125mg  po every 3 days for 14 days. MAY USE ORAL FORMULA IF CHEAPER.Marland Kitchen No refills.  ______

## 2014-12-30 ENCOUNTER — Telehealth: Payer: Self-pay

## 2014-12-30 NOTE — Telephone Encounter (Signed)
Pt called- she is aware that she should not take prevacid with the vanc. She said she will develop a lot of burning and inflammation and she is not sure if tums will be enough to take care of it. She wants to know if there is anything else that she would be able to take for reflux?

## 2015-01-01 NOTE — Telephone Encounter (Signed)
Pepcid or Zantac. Taking PPI therapy can make it more difficult to get rid of the C.diff. If GERD gets bad and not responding to pepcid or Zantac then take prevacid every other day.  She can resume prevacid once vancomycin is complete.

## 2015-01-03 ENCOUNTER — Encounter: Payer: Self-pay | Admitting: Internal Medicine

## 2015-01-03 NOTE — Progress Notes (Signed)
APPOINTMENT MADE AND LETTER SENT °

## 2015-01-03 NOTE — Telephone Encounter (Signed)
Tried to call pt- LMOM 

## 2015-01-04 NOTE — Telephone Encounter (Signed)
Pt is aware and she is aware of her follow up appt on 01/24/15 with LSL.

## 2015-01-09 ENCOUNTER — Telehealth: Payer: Self-pay | Admitting: Internal Medicine

## 2015-01-09 NOTE — Telephone Encounter (Signed)
Patient called me yesterday afternoon. On second round of treatment for C. difficile with vancomycin as she reports. She reports being told to back off on her PPI. Having a flare of reflux. Zantac caused a headache.  Reports having a tough time with reflux. I advised her to try Pepcid Complete one to 2 tablets twice daily as needed. She has an office visit with [place] next month. I advised her to call the office the first of the week and give [place] a progress report. Office visit may need to be moved up.

## 2015-01-09 NOTE — Telephone Encounter (Signed)
Pt has ov on 01/24/15

## 2015-01-11 ENCOUNTER — Encounter: Payer: Self-pay | Admitting: Gynecology

## 2015-01-11 ENCOUNTER — Ambulatory Visit (INDEPENDENT_AMBULATORY_CARE_PROVIDER_SITE_OTHER): Payer: Medicare Other | Admitting: Gynecology

## 2015-01-11 ENCOUNTER — Other Ambulatory Visit (HOSPITAL_COMMUNITY)
Admission: RE | Admit: 2015-01-11 | Discharge: 2015-01-11 | Disposition: A | Discharge status: Home / Self Care | Payer: Medicare Other | Source: Ambulatory Visit | Attending: Gynecology | Admitting: Gynecology

## 2015-01-11 VITALS — BP 120/74 | Ht 68.0 in | Wt 183.0 lb

## 2015-01-11 DIAGNOSIS — Z124 Encounter for screening for malignant neoplasm of cervix: Secondary | ICD-10-CM | POA: Insufficient documentation

## 2015-01-11 DIAGNOSIS — Z1272 Encounter for screening for malignant neoplasm of vagina: Secondary | ICD-10-CM | POA: Diagnosis not present

## 2015-01-11 DIAGNOSIS — Z01419 Encounter for gynecological examination (general) (routine) without abnormal findings: Secondary | ICD-10-CM

## 2015-01-11 DIAGNOSIS — M8580 Other specified disorders of bone density and structure, unspecified site: Secondary | ICD-10-CM

## 2015-01-11 NOTE — Patient Instructions (Signed)
Schedule your mammogram as you are overdue. Call me if you want to pursue follow up with the surgeon in reference to the fistula.  You may obtain a copy of any labs that were done today by logging onto MyChart as outlined in the instructions provided with your AVS (after visit summary). The office will not call with normal lab results but certainly if there are any significant abnormalities then we will contact you.   Health Maintenance, Female A healthy lifestyle and preventative care can promote health and wellness.  Maintain regular health, dental, and eye exams.  Eat a healthy diet. Foods like vegetables, fruits, whole grains, low-fat dairy products, and lean protein foods contain the nutrients you need without too many calories. Decrease your intake of foods high in solid fats, added sugars, and salt. Get information about a proper diet from your caregiver, if necessary.  Regular physical exercise is one of the most important things you can do for your health. Most adults should get at least 150 minutes of moderate-intensity exercise (any activity that increases your heart rate and causes you to sweat) each week. In addition, most adults need muscle-strengthening exercises on 2 or more days a week.   Maintain a healthy weight. The body mass index (BMI) is a screening tool to identify possible weight problems. It provides an estimate of body fat based on height and weight. Your caregiver can help determine your BMI, and can help you achieve or maintain a healthy weight. For adults 20 years and older:  A BMI below 18.5 is considered underweight.  A BMI of 18.5 to 24.9 is normal.  A BMI of 25 to 29.9 is considered overweight.  A BMI of 30 and above is considered obese.  Maintain normal blood lipids and cholesterol by exercising and minimizing your intake of saturated fat. Eat a balanced diet with plenty of fruits and vegetables. Blood tests for lipids and cholesterol should begin at age 30  and be repeated every 5 years. If your lipid or cholesterol levels are high, you are over 50, or you are a high risk for heart disease, you may need your cholesterol levels checked more frequently.Ongoing high lipid and cholesterol levels should be treated with medicines if diet and exercise are not effective.  If you smoke, find out from your caregiver how to quit. If you do not use tobacco, do not start.  Lung cancer screening is recommended for adults aged 46 80 years who are at high risk for developing lung cancer because of a history of smoking. Yearly low-dose computed tomography (CT) is recommended for people who have at least a 30-pack-year history of smoking and are a current smoker or have quit within the past 15 years. A pack year of smoking is smoking an average of 1 pack of cigarettes a day for 1 year (for example: 1 pack a day for 30 years or 2 packs a day for 15 years). Yearly screening should continue until the smoker has stopped smoking for at least 15 years. Yearly screening should also be stopped for people who develop a health problem that would prevent them from having lung cancer treatment.  If you are pregnant, do not drink alcohol. If you are breastfeeding, be very cautious about drinking alcohol. If you are not pregnant and choose to drink alcohol, do not exceed 1 drink per day. One drink is considered to be 12 ounces (355 mL) of beer, 5 ounces (148 mL) of wine, or 1.5 ounces (44 mL)  of liquor.  Avoid use of street drugs. Do not share needles with anyone. Ask for help if you need support or instructions about stopping the use of drugs.  High blood pressure causes heart disease and increases the risk of stroke. Blood pressure should be checked at least every 1 to 2 years. Ongoing high blood pressure should be treated with medicines, if weight loss and exercise are not effective.  If you are 69 to 71 years old, ask your caregiver if you should take aspirin to prevent  strokes.  Diabetes screening involves taking a blood sample to check your fasting blood sugar level. This should be done once every 3 years, after age 51, if you are within normal weight and without risk factors for diabetes. Testing should be considered at a younger age or be carried out more frequently if you are overweight and have at least 1 risk factor for diabetes.  Breast cancer screening is essential preventative care for women. You should practice "breast self-awareness." This means understanding the normal appearance and feel of your breasts and may include breast self-examination. Any changes detected, no matter how small, should be reported to a caregiver. Women in their 33s and 30s should have a clinical breast exam (CBE) by a caregiver as part of a regular health exam every 1 to 3 years. After age 48, women should have a CBE every year. Starting at age 77, women should consider having a mammogram (breast X-ray) every year. Women who have a family history of breast cancer should talk to their caregiver about genetic screening. Women at a high risk of breast cancer should talk to their caregiver about having an MRI and a mammogram every year.  Breast cancer gene (BRCA)-related cancer risk assessment is recommended for women who have family members with BRCA-related cancers. BRCA-related cancers include breast, ovarian, tubal, and peritoneal cancers. Having family members with these cancers may be associated with an increased risk for harmful changes (mutations) in the breast cancer genes BRCA1 and BRCA2. Results of the assessment will determine the need for genetic counseling and BRCA1 and BRCA2 testing.  The Pap test is a screening test for cervical cancer. Women should have a Pap test starting at age 96. Between ages 29 and 61, Pap tests should be repeated every 2 years. Beginning at age 28, you should have a Pap test every 3 years as long as the past 3 Pap tests have been normal. If you had a  hysterectomy for a problem that was not cancer or a condition that could lead to cancer, then you no longer need Pap tests. If you are between ages 35 and 54, and you have had normal Pap tests going back 10 years, you no longer need Pap tests. If you have had past treatment for cervical cancer or a condition that could lead to cancer, you need Pap tests and screening for cancer for at least 20 years after your treatment. If Pap tests have been discontinued, risk factors (such as a new sexual partner) need to be reassessed to determine if screening should be resumed. Some women have medical problems that increase the chance of getting cervical cancer. In these cases, your caregiver may recommend more frequent screening and Pap tests.  The human papillomavirus (HPV) test is an additional test that may be used for cervical cancer screening. The HPV test looks for the virus that can cause the cell changes on the cervix. The cells collected during the Pap test can be tested  for HPV. The HPV test could be used to screen women aged 32 years and older, and should be used in women of any age who have unclear Pap test results. After the age of 35, women should have HPV testing at the same frequency as a Pap test.  Colorectal cancer can be detected and often prevented. Most routine colorectal cancer screening begins at the age of 28 and continues through age 74. However, your caregiver may recommend screening at an earlier age if you have risk factors for colon cancer. On a yearly basis, your caregiver may provide home test kits to check for hidden blood in the stool. Use of a small camera at the end of a tube, to directly examine the colon (sigmoidoscopy or colonoscopy), can detect the earliest forms of colorectal cancer. Talk to your caregiver about this at age 62, when routine screening begins. Direct examination of the colon should be repeated every 5 to 10 years through age 51, unless early forms of pre-cancerous  polyps or small growths are found.  Hepatitis C blood testing is recommended for all people born from [birth year] through 1967 and any individual with known risks for hepatitis C.  Practice safe sex. Use condoms and avoid high-risk sexual practices to reduce the spread of sexually transmitted infections (STIs). Sexually active women aged 59 and younger should be checked for Chlamydia, which is a common sexually transmitted infection. Older women with new or multiple partners should also be tested for Chlamydia. Testing for other STIs is recommended if you are sexually active and at increased risk.  Osteoporosis is a disease in which the bones lose minerals and strength with aging. This can result in serious bone fractures. The risk of osteoporosis can be identified using a bone density scan. Women ages 43 and over and women at risk for fractures or osteoporosis should discuss screening with their caregivers. Ask your caregiver whether you should be taking a calcium supplement or vitamin D to reduce the rate of osteoporosis.  Menopause can be associated with physical symptoms and risks. Hormone replacement therapy is available to decrease symptoms and risks. You should talk to your caregiver about whether hormone replacement therapy is right for you.  Use sunscreen. Apply sunscreen liberally and repeatedly throughout the day. You should seek shade when your shadow is shorter than you. Protect yourself by wearing long sleeves, pants, a wide-brimmed hat, and sunglasses year round, whenever you are outdoors.  Notify your caregiver of new moles or changes in moles, especially if there is a change in shape or color. Also notify your caregiver if a mole is larger than the size of a pencil eraser.  Stay current with your immunizations. Document Released: 05/20/2011 Document Revised: 03/01/2013 Document Reviewed: 05/20/2011 [facility] Patient Information 2014 [place].

## 2015-01-11 NOTE — Addendum Note (Signed)
Addended by: Nelva Nay on: 01/11/2015 01:05 PM   Modules accepted: Orders

## 2015-01-11 NOTE — Progress Notes (Signed)
Kaitlin May August 24, 1946 [id number]        69 y.o.  G2P2 new patient, former patient of Dr. Ubaldo Glassing, for breast and pelvic exam. Several issues noted below.  Past medical history,surgical history, problem list, medications, allergies, family history and social history were all reviewed and documented as reviewed in the EPIC chart.  ROS:  Performed with pertinent positives and negatives included in the history, assessment and plan.   Additional significant findings :  none   Exam: Kaitlin May Vitals:   01/11/15 1055  BP: 120/74  Height: 5\' 8"  (1.727 m)  Weight: 183 lb (83.008 kg)   General appearance:  Normal affect, orientation and appearance. Skin: Grossly normal HEENT: Without gross lesions.  No cervical or supraclavicular adenopathy. Thyroid normal.  Lungs:  Clear without wheezing, rales or rhonchi Cardiac: RR, without RMG Abdominal:  Soft, nontender, without masses, guarding, rebound, organomegaly or hernia Breasts:  Examined lying and sitting without masses, retractions, discharge or axillary adenopathy. Pelvic:  Ext/BUS/vagina with generalized atrophic changes. No overt evidence of fistula  Adnexa  Without masses or tenderness    Anus and perineum  Normal   Rectovaginal  Normal sphincter tone without palpated masses or tenderness.    Assessment/Plan:  69 y.o. G2P2 female for breast and pelvic exam  1. Postmenopausal/atrophic genital changes. History of TAH LSO for irregular heavy bleeding with reported "hyperplasia". Not having significant hot flushes, night sweats or vaginal dryness. Not sexually active. Continue to monitor. 2. 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 Patient reports it coming out on the skin below the vaginal opening and not through the vaginal mucosa. Review of Dr. Josie May notes from the last 2 years does not report finding any fistula. Saw a gastroenterologist who apparently they were going to order a fistulogram but she has  not done this yet. My exam today is normal showing atrophic changes but I could not demonstrate any fistula on exam or rectal manipulation. Options of fistulogram or referral to colorectal surgeon discussed.  She has had this for years historically and I asked her whether she would consider surgery to repair this. Isn't bothersome enough to assume the risks to include potential temporary colostomy if complications of the surgery arise. Patient's unsure whether she would do any intervention and therefore I do not think doing any further testing would be warranted. Patient wants to think of her options and she will call me if she wants referral to the colorectal surgeon. 3. Osteopenia. DEXA at her internal medicine office shows T score -2.1. FRAX was not done. They did not recommend treatment at this time. Recommended increased calcium and vitamin D. Make sure they check a vitamin D level with her blood work through their office. Follow up with them for recommendations as far as repeat interval and treatment. 4. Pap smear 2013. Pap smear done today. Current screening recommendations reviewed. Status post hysterectomy for benign indications and over the age 69. Options to stop screening all together discussed.  Will readdress on annual basis. 5. Mammography 2014. Patient knows she is overdue and agrees to schedule. SBE monthly reviewed. 6. Colonoscopy 2012. Repeat at their recommended interval. 7. Health maintenance. Patient currently being treated for C. Difficile. No routine blood work done as this is done at her primary physician's office. Follow up in one year, sooner as needed.     [name] MD, 12:33 PM 01/11/2015

## 2015-01-12 NOTE — Telephone Encounter (Signed)
Recommend very soft, low-fat, diet. NO DAIRY right now. Continue Vancomycin. Continue Florastor.  If she is still have problems at time of OV, we will consider rechecking her stool and if still positive then changing her medication.

## 2015-01-12 NOTE — Telephone Encounter (Signed)
Pt is aware. She wants to know if you want her to go ahead and do a stool sample now so it will be back before her ov?

## 2015-01-12 NOTE — Telephone Encounter (Signed)
Either way

## 2015-01-12 NOTE — Telephone Encounter (Signed)
Pt called- she continues to be bloated and feels sore and has no appetite. She is have several loose stools a day. She is requesting an earlier appt with LSL or RMR. She had an appt for 01/24/15. We were able to move her to the first available urgent spot with is 01/18/15. Pt is still not happy and feels like something needs to be done before that. Any further recommendations?

## 2015-01-13 ENCOUNTER — Other Ambulatory Visit: Payer: Self-pay | Admitting: Gastroenterology

## 2015-01-13 ENCOUNTER — Other Ambulatory Visit: Payer: Self-pay

## 2015-01-13 DIAGNOSIS — R197 Diarrhea, unspecified: Secondary | ICD-10-CM

## 2015-01-13 LAB — CYTOLOGY - PAP

## 2015-01-13 NOTE — Telephone Encounter (Signed)
Lab order has been done and container and orders are at the front desk.

## 2015-01-13 NOTE — Telephone Encounter (Signed)
Pt is aware. She is thinking about coming by Monday to get container and then bringing it back before her appt.

## 2015-01-18 ENCOUNTER — Encounter: Payer: Self-pay | Admitting: Gastroenterology

## 2015-01-18 ENCOUNTER — Ambulatory Visit (INDEPENDENT_AMBULATORY_CARE_PROVIDER_SITE_OTHER): Payer: Medicare Other | Admitting: Gastroenterology

## 2015-01-18 VITALS — BP 150/82 | HR 84 | Temp 97.2°F | Ht 68.0 in | Wt 181.2 lb

## 2015-01-18 DIAGNOSIS — K219 Gastro-esophageal reflux disease without esophagitis: Secondary | ICD-10-CM

## 2015-01-18 DIAGNOSIS — A498 Other bacterial infections of unspecified site: Secondary | ICD-10-CM | POA: Insufficient documentation

## 2015-01-18 DIAGNOSIS — B9689 Other specified bacterial agents as the cause of diseases classified elsewhere: Secondary | ICD-10-CM

## 2015-01-18 MED ORDER — DICYCLOMINE HCL 10 MG PO CAPS: 10.0000 mg | ORAL_CAPSULE | Freq: Four times a day (QID) | ORAL | Status: DC | PRN

## 2015-01-18 MED ORDER — FAMOTIDINE 40 MG PO TABS: 40.0000 mg | ORAL_TABLET | Freq: Two times a day (BID) | ORAL | Status: DC

## 2015-01-18 MED ORDER — HYDROCORTISONE 2.5 % RE CREA: 1.0000 "application " | TOPICAL_CREAM | Freq: Two times a day (BID) | RECTAL | Status: DC

## 2015-01-18 MED ORDER — HYDROCODONE-ACETAMINOPHEN 5-325 MG PO TABS: 1.0000 | ORAL_TABLET | Freq: Four times a day (QID) | ORAL | Status: DC | PRN

## 2015-01-18 NOTE — Patient Instructions (Signed)
1. Stop Pepcid Complete. Start prescription Pepcid 40 mg twice daily. 2. Continue vancomycin for now. 3. Prescriptions for Bentyl, hydrocortisone cream, Vicodin provided. 4. Vicodin has Tylenol in it. Please do not take at the same time as Fioricet. Do not take more than 3 g (3000 mg) of Tylenol in a 24 hour period of time.  5. We will discuss plan once C.diff results are back. If negative, then we will pursue a CT scan. If positive, then we will consider if we need to change vancomycin.

## 2015-01-18 NOTE — Progress Notes (Signed)
cc'ed to pcp °

## 2015-01-18 NOTE — Progress Notes (Addendum)
Primary Care Physician: Glenda Chroman., MD  Primary Gastroenterologist:  Garfield Cornea, MD   Chief Complaint  Patient presents with  . cdiff    HPI: Kaitlin May is a 69 y.o. female here for follow up of C.Diff. Her weight is down five additional pounds. On her second course of vancomycin, currently on a taper at twice per day. States she is no better. Continues to have several stools daily postprandially but some days less. Chronically constipated. Although has never had frequent diarrhea with C.diff but has had daily BMs, not her norm. Last week some diarrhea. Feels nauseated. No vomiting. Heartburn is terrible off prevacid. Taking Pepcid complete twice daily without relief. Feels like abdomen is full/swollen. Hurts in the lower abdomen especially with movement and bending over. Really denies any tenderness to palpation. Recently tried Zantac for her heartburn but this caused severe headache.   Spent at least 15 minutes answering patient's questions that she had written down.  Current Outpatient Prescriptions  Medication Sig Dispense Refill  . butalbital-acetaminophen-caffeine (FIORICET, ESGIC) 50-325-40 MG per tablet Take 2 tablets by mouth as needed.      . dicyclomine (BENTYL) 10 MG capsule     . famotidine (PEPCID) 10 MG tablet Take 10 mg by mouth 2 (two) times daily. Pt not sure of mg dosage    . hydrocortisone (ANUSOL-HC) 25 MG suppository Place 1 suppository (25 mg total) rectally 2 (two) times daily as needed for hemorrhoids or itching. 24 suppository 1  . LORazepam (ATIVAN) 1 MG tablet Take 1 mg by mouth daily as needed.      . promethazine (PHENERGAN) 25 MG suppository Place 25 mg rectally every 6 (six) hours as needed.      . Saccharomyces boulardii (FLORASTOR PO) Take by mouth.    . vancomycin (VANCOCIN) 125 MG capsule Take 125 mg by mouth 2 (two) times daily.     . lansoprazole (PREVACID) 30 MG capsule Take 30 mg by mouth daily.       No current  facility-administered medications for this visit.    Allergies as of 01/18/2015 - Review Complete 01/18/2015  Allergen Reaction Noted  . Penicillins Palpitations 03/04/2011   Past Medical History  Diagnosis Date  . Acoustic neuroma     in situ  . HTN (hypertension)   . Migraines     uses demerol and phenergan for migraines due to severity of pain, nausea  . Seasonal allergies   . GERD (gastroesophageal reflux disease)   . 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 Gala Romney  . Diverticula of colon   . Diverticulitis      Negative CTs in 2004, 2009, 2011, 2013. Positive for sigmoid diverticulitis in 03/2009  . C. difficile colitis   . Rectal fistula    Past Surgical History  Procedure Laterality Date  . Tonsillectomy    . Appendectomy    . Colonoscopy  03/25/11    Dr. Elwyn Reach sided diverticula, normal rectum, tubular adenoma. Due for next TCS 03/2018.   . Oophorectomy      LSO  . Abdominal hysterectomy      endometrial hyperplasia, irreg bleeding   Family History  Problem Relation Age of Onset  . Melanoma Father     deceased at 52  . Diabetes Mother   . Dementia Mother     deceased at 88  . Colon cancer Neg Hx    History   Social History  . Marital Status:  Married    Spouse Name: N/A  . Number of Children: N/A  . Years of Education: N/A   Social History Main Topics  . Smoking status: Never Smoker   . Smokeless tobacco: Never Used  . Alcohol Use: No  . Drug Use: No  . Sexual Activity:    Partners: Male    Birth Control/ Protection:      Comment: 1st intercourse 86 yo-1 partner   Other Topics Concern  . None   Social History Narrative    ROS:  General: Negative for fever. Positive fatigue, weight loss, anorexia patient reports 20 pound weight loss. ENT: Negative for hoarseness, difficulty swallowing , nasal congestion. CV: Negative for chest pain, angina, palpitations, dyspnea on exertion, peripheral edema.  Respiratory: Negative for dyspnea at rest,  dyspnea on exertion, cough, sputum, wheezing.  GI: See history of present illness. GU:  Negative for dysuria, hematuria, urinary incontinence, urinary frequency, nocturnal urination.  Endo: See history of present illness and general review of systems  Physical Examination:   BP 150/82 mmHg  Pulse 84  Temp(Src) 97.2 F (36.2 C)  Ht 5\' 8"  (1.727 m)  Wt 181 lb 3.2 oz (82.192 kg)  BMI 27.56 kg/m2  General: Well-nourished, well-developed in no acute distress. Accompanied by her spouse. Eyes: No icterus. Mouth: Oropharyngeal mucosa moist and pink , no lesions erythema or exudate. Lungs: Clear to auscultation bilaterally.  Heart: Regular rate and rhythm, no murmurs rubs or gallops.  Abdomen: Bowel sounds are normal, mild lower abdominal tenderness, nondistended, no hepatosplenomegaly or masses, no abdominal bruits or hernia , no rebound or guarding.   Extremities: No lower extremity edema. No clubbing or deformities. Neuro: Alert and oriented x 4   Skin: Warm and dry, no jaundice.   Psych: Alert and cooperative, normal mood and affect.

## 2015-01-18 NOTE — Assessment & Plan Note (Signed)
Significant breakthrough symptoms since stopping PPI in the setting of C. difficile treatment. Continues to be symptomatic on Pepcid Complete. Increase the Pepcid 40 mg twice a day, prescription provided. If continues to have symptoms, consider adding Carafate or possibly resuming PPI therapy.

## 2015-01-18 NOTE — Assessment & Plan Note (Signed)
Patient's currently on vancomycin taper for C. difficile. Treated initially in December with resolution of symptoms for short period time. Tells me that she's had no improvement in any of her abdominal pain and overall feels poorly. Has lost additional weight. Cannot eat. Never really had significant diarrhea but went from baseline constipation to having several soft stools daily. Patient resubmitted an additional C. difficile PCR today, await findings. She remains positive, consider changing therapy. If negative, may consider repeat CT scan given her symptoms are worsening. Patient inquired about hospitalization but at this time I feel she can be managed as an outpatient unless it changes.  Discussed with patient and spouse today, I do have some current concerns that all of her symptoms may not be related to C. difficile given lack of significant diarrhea. Discussed possibility of carrier state.

## 2015-01-19 LAB — CLOSTRIDIUM DIFFICILE BY PCR: Toxigenic C. Difficile by PCR: NOT DETECTED

## 2015-01-19 NOTE — Progress Notes (Signed)
Quick Note:  Please let patient know her Cdiff pcr is negative.  She needs to complete the vancomycin taper.  As discussed at [facility], recommend CT A/P with contrast to evaluate her weight loss, abdominal pain. Last creatinine was 0.91 on 11/02/15. ______

## 2015-01-23 ENCOUNTER — Telehealth: Payer: Self-pay | Admitting: Internal Medicine

## 2015-01-23 ENCOUNTER — Other Ambulatory Visit: Payer: Self-pay

## 2015-01-23 DIAGNOSIS — R634 Abnormal weight loss: Secondary | ICD-10-CM

## 2015-01-23 DIAGNOSIS — R109 Unspecified abdominal pain: Secondary | ICD-10-CM

## 2015-01-23 MED ORDER — SUCRALFATE 1 GM/10ML PO SUSP: 1.0000 g | Freq: Four times a day (QID) | ORAL | Status: DC

## 2015-01-23 NOTE — Telephone Encounter (Signed)
I sent in Carafate.

## 2015-01-23 NOTE — Telephone Encounter (Signed)
I spoke with the pt, she started having problems on Saturday after she ate. She is taking the pepcid bid and it is not helping. She said LSL said something about carafate and wants to know if she can have an rx sent in for it. Pt uses [facility].

## 2015-01-23 NOTE — Telephone Encounter (Signed)
Patient called this morning asking to speak with nurse. I told her that everyone is seeing patients and I would have to take a message. She said that her acid reflux has gotten worse over the weekend and she can't eat much because of it.  I asked about which pharmacy she uses in case the provider was to call something in for her. Patient said that was one reason she was calling was to speak to JL about pepcid 40mg  twice a day, but then she asked me again to get someone on the phone to speak with her. I told her that I couldn't that the nurses and providers are all seeing and helping patients and that I would send a message to the nurse to call her back. Please call her to advise at [phone]

## 2015-01-24 ENCOUNTER — Ambulatory Visit: Payer: Medicare Other | Admitting: Gastroenterology

## 2015-01-24 ENCOUNTER — Telehealth: Payer: Self-pay | Admitting: Gastroenterology

## 2015-01-24 NOTE — Telephone Encounter (Signed)
Peer to peer review for CT A/P with contrast.   P.A. Number [id number] for [facility] good through 02/17/15.

## 2015-01-24 NOTE — Telephone Encounter (Signed)
Noted and LMOM for Pre service center

## 2015-01-24 NOTE — Telephone Encounter (Signed)
Noted. Agree.

## 2015-01-26 ENCOUNTER — Ambulatory Visit (HOSPITAL_COMMUNITY)
Admission: RE | Admit: 2015-01-26 | Discharge: 2015-01-26 | Disposition: A | Discharge status: Home / Self Care | Payer: Medicare Other | Source: Ambulatory Visit | Attending: Gastroenterology | Admitting: Gastroenterology

## 2015-01-26 DIAGNOSIS — R109 Unspecified abdominal pain: Secondary | ICD-10-CM

## 2015-01-26 DIAGNOSIS — R634 Abnormal weight loss: Secondary | ICD-10-CM | POA: Diagnosis not present

## 2015-01-26 LAB — POCT I-STAT CREATININE: CREATININE: 0.9 mg/dL (ref 0.50–1.10)

## 2015-01-26 MED ORDER — IOHEXOL 300 MG/ML  SOLN
100.0000 mL | Freq: Once | INTRAMUSCULAR | Status: AC | PRN
  Administered 2015-01-26: 100 mL via INTRAVENOUS

## 2015-01-27 NOTE — Progress Notes (Signed)
Quick Note:  Please let patient know her CT showed NO evidence of diverticulitis. Nothing to explain her pain or weight loss.  She should complete her vanc taper. Needs OV with RMR only within 2 weeks. ______

## 2015-01-30 ENCOUNTER — Telehealth: Payer: Self-pay

## 2015-01-30 ENCOUNTER — Telehealth: Payer: Self-pay | Admitting: Internal Medicine

## 2015-01-30 ENCOUNTER — Other Ambulatory Visit: Payer: Self-pay

## 2015-01-30 DIAGNOSIS — Z8619 Personal history of other infectious and parasitic diseases: Secondary | ICD-10-CM

## 2015-01-30 NOTE — Telephone Encounter (Signed)
Spoke with the pt this morning. She is still concerned that she has cdiff. She wants to know if she can so another stool sample for cdiff. She has an ov with RMR on Friday 02/03/15.

## 2015-01-30 NOTE — Telephone Encounter (Signed)
Tried to call pt- NA. LMOM, informed her that we were ordering cdiff again and container would be at the front desk or she could go by the lab.

## 2015-01-30 NOTE — Telephone Encounter (Signed)
Patient has a f/u ov with RMR per [facility] on 3/18th at 11am.

## 2015-01-30 NOTE — Telephone Encounter (Signed)
Patient called yesterday. Concerned she hadn't heard from the CT scan. CT scan results reviewed by me. Nothing to explain symptoms. Patient states she can't eat. Not getting any better. She is to call the office next week for an appointment.

## 2015-01-30 NOTE — Telephone Encounter (Signed)
Let's do another one

## 2015-01-31 NOTE — Telephone Encounter (Signed)
Pt has picked up stool container.

## 2015-02-01 LAB — CLOSTRIDIUM DIFFICILE BY PCR: Toxigenic C. Difficile by PCR: NOT DETECTED

## 2015-02-03 ENCOUNTER — Ambulatory Visit (INDEPENDENT_AMBULATORY_CARE_PROVIDER_SITE_OTHER): Payer: Medicare Other | Admitting: Internal Medicine

## 2015-02-03 ENCOUNTER — Telehealth: Payer: Self-pay

## 2015-02-03 ENCOUNTER — Encounter: Payer: Self-pay | Admitting: Internal Medicine

## 2015-02-03 VITALS — BP 131/74 | HR 81 | Temp 97.0°F | Ht 68.0 in | Wt 179.2 lb

## 2015-02-03 DIAGNOSIS — A498 Other bacterial infections of unspecified site: Secondary | ICD-10-CM

## 2015-02-03 DIAGNOSIS — R109 Unspecified abdominal pain: Secondary | ICD-10-CM

## 2015-02-03 DIAGNOSIS — B9689 Other specified bacterial agents as the cause of diseases classified elsewhere: Secondary | ICD-10-CM

## 2015-02-03 DIAGNOSIS — K219 Gastro-esophageal reflux disease without esophagitis: Secondary | ICD-10-CM

## 2015-02-03 NOTE — Progress Notes (Signed)
Primary Care Physician:  Glenda Chroman., MD Primary Gastroenterologist:  Dr. Gala Romney  Pre-Procedure History & Physical: HPI:  Kaitlin May is a 69 y.o. female here for evaluation of persisting bilateral lower quadrant abdominal pain in the setting of a C. Difficile infection. She has been on a slow vancomycin taper;  should complete by the end of this month. Diarrhea has essentially ceased. Complains of a gnawing aching bilateral lower quadrant abdominal discomfort. Has noted a slight amount of blood with wiping on occasion. No melena. Last 2  C. Difficile toxin assays came back negative. She is tolerating vancomycin. She is having a problem with her reflux ;didn't like Carafate suspension;  Took a couple doses  -  felt to nauseated. Has been using Pepcid in lieu of Prevacid. Significant issues with breakthrough reflux symptoms with this regimen. No dysphagia.  Adenoma removed from her colon in 2012. She continues taking a probiotic daily in the way of Florastor.    Past Medical History  Diagnosis Date  . Acoustic neuroma     in situ  . HTN (hypertension)   . Migraines     uses demerol and phenergan for migraines due to severity of pain, nausea  . Seasonal allergies   . GERD (gastroesophageal reflux disease)   . 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 Gala Romney  . Diverticula of colon   . Diverticulitis      Negative CTs in 2004, 2009, 2011, 2013. Positive for sigmoid diverticulitis in 03/2009  . C. difficile colitis   . Rectal fistula     Past Surgical History  Procedure Laterality Date  . Tonsillectomy    . Appendectomy    . Colonoscopy  03/25/11    Dr. Elwyn Reach sided diverticula, normal rectum, tubular adenoma. Due for next TCS 03/2018.   . Oophorectomy      LSO  . Abdominal hysterectomy      endometrial hyperplasia, irreg bleeding    Prior to Admission medications   Medication Sig Start Date End Date Taking? Authorizing Provider  butalbital-acetaminophen-caffeine  (FIORICET, ESGIC) 50-325-40 MG per tablet Take 2 tablets by mouth as needed.     Yes Historical Provider, MD  dicyclomine (BENTYL) 10 MG capsule Take 1 capsule (10 mg total) by mouth 4 (four) times daily as needed for spasms. 01/18/15  Yes Mahala Menghini, PA-C  famotidine (PEPCID) 40 MG tablet Take 1 tablet (40 mg total) by mouth 2 (two) times daily. 01/18/15  Yes Mahala Menghini, PA-C  hydrocortisone (ANUSOL-HC) 25 MG suppository Place 1 suppository (25 mg total) rectally 2 (two) times daily as needed for hemorrhoids or itching. 12/26/14  Yes Mahala Menghini, PA-C  LORazepam (ATIVAN) 1 MG tablet Take 1 mg by mouth daily as needed.     Yes Historical Provider, MD  promethazine (PHENERGAN) 25 MG suppository Place 25 mg rectally every 6 (six) hours as needed.     Yes Historical Provider, MD  Saccharomyces boulardii (FLORASTOR PO) Take by mouth.   Yes Historical Provider, MD  vancomycin (VANCOCIN) 125 MG capsule Take 125 mg by mouth 2 (two) times daily.    Yes Historical Provider, MD  HYDROcodone-acetaminophen (NORCO/VICODIN) 5-325 MG per tablet Take 1 tablet by mouth every 6 (six) hours as needed for moderate pain. Patient not taking: Reported on 02/03/2015 01/18/15   Mahala Menghini, PA-C  hydrocortisone (ANUSOL-HC) 2.5 % rectal cream Place 1 application rectally 2 (two) times daily. Patient not taking: Reported on 02/03/2015  01/18/15   Mahala Menghini, PA-C  sucralfate (CARAFATE) 1 GM/10ML suspension Take 10 mLs (1 g total) by mouth 4 (four) times daily. Patient not taking: Reported on 02/03/2015 01/23/15   Orvil Feil, NP    Allergies as of 02/03/2015 - Review Complete 02/03/2015  Allergen Reaction Noted  . Penicillins Palpitations 03/04/2011  . Prednisone  01/18/2015    Family History  Problem Relation Age of Onset  . Melanoma Father     deceased at 67  . Diabetes Mother   . Dementia Mother     deceased at 56  . Colon cancer Neg Hx     History   Social History  . Marital Status: Married    Spouse  Name: N/A  . Number of Children: N/A  . Years of Education: N/A   Occupational History  . Not on file.   Social History Main Topics  . Smoking status: Never Smoker   . Smokeless tobacco: Never Used  . Alcohol Use: No  . Drug Use: No  . Sexual Activity:    Partners: Male    Birth Control/ Protection:      Comment: 1st intercourse 30 yo-1 partner   Other Topics Concern  . Not on file   Social History Narrative    Review of Systems: See HPI, otherwise negative ROS  Physical Exam: BP 131/74 mmHg  Pulse 81  Temp(Src) 97 F (36.1 C)  Ht 5\' 8"  (1.727 m)  Wt 179 lb 3.2 oz (81.285 kg)  BMI 27.25 kg/m2 General:   Alert,  Anxious appearing elderly lady, well-nourished, pleasant and cooperative in NAD.  She is accompanied by her husband. Skin:  Intact without significant lesions or rashes. Eyes:  Sclera clear, no icterus.   Conjunctiva pink. Ears:  Normal auditory acuity. Nose:  No deformity, discharge,  or lesions. Mouth:  No deformity or lesions. Neck:  Supple; no masses or thyromegaly. No significant cervical adenopathy. Lungs:  Clear throughout to auscultation.   No wheezes, crackles, or rhonchi. No acute distress. Heart:  Regular rate and rhythm; no murmurs, clicks, rubs,  or gallops. Abdomen: Non-distended, normal bowel sounds.  Soft and nontender without appreciable mass or hepatosplenomegaly.  Pulses:  Normal pulses noted. Extremities:  Without clubbing or edema.  Impression:  Pleasant 69 year old lady with recent diarrheal illness caused by Clostridium difficile infection. She is wrapping of last 2 weeks of the sling vancomycin taper. Diarrhea has really improved considerably. At this point, I'm not sure that taking Metamucil is productive. Her big concern is that of an achy bilateral lower quadrant abdominal pain. This may or may not be related to resolving Clostridium difficile infection. She has a worsening reflux symptoms since a PPI therapy has been restricted. She  does not look at all toxic or acutely ill to me.  I note she is actually gained 2 pounds since her last visit.   Recommendations:  Continue probiotic daily  Continue Bentyl when necessary basis.  Stop Metamucil for now  Finish out Vancomycin  May resume prevacid at a dose of 15 mg daily after vancomycin complete  Re-try the Carafate suspension  CBC, BMET now  Consider a colonoscopy later this year if bowel symptoms persist (Would be best with propofol).  Office visit with [place] in 2 months     Notice: This dictation was prepared with Dragon dictation along with smaller phrase technology. Any transcriptional errors that result from this process are unintentional and may not be corrected upon review.

## 2015-02-03 NOTE — Patient Instructions (Signed)
  Continue probiotic daily  Continue Bentyl  Stop Metamucil for now  Finish out Vancomycin  May resume prevacid at a dose of 15 mg daily after vancomycin complete  Re-try the Carafate suspension  CBC, BMET now  You may need a colonoscopy  Office visit with [place] in 2 months

## 2015-02-03 NOTE — Telephone Encounter (Signed)
Per Dr.Rourk, Rx for Prevacid 15 mg #30 , one tablet daily with 3 refills called to [facility]. Pt is to start this Rx on 02/17/2015.

## 2015-02-04 LAB — CBC WITH DIFFERENTIAL/PLATELET
BASOS ABS: 0.1 10*3/uL (ref 0.0–0.1)
BASOS PCT: 1 % (ref 0–1)
Eosinophils Absolute: 0.1 10*3/uL (ref 0.0–0.7)
Eosinophils Relative: 2 % (ref 0–5)
HEMATOCRIT: 40 % (ref 36.0–46.0)
Hemoglobin: 13.1 g/dL (ref 12.0–15.0)
LYMPHS PCT: 25 % (ref 12–46)
Lymphs Abs: 1.5 10*3/uL (ref 0.7–4.0)
MCH: 28.7 pg (ref 26.0–34.0)
MCHC: 32.8 g/dL (ref 30.0–36.0)
MCV: 87.5 fL (ref 78.0–100.0)
MPV: 10.6 fL (ref 8.6–12.4)
Monocytes Absolute: 0.3 10*3/uL (ref 0.1–1.0)
Monocytes Relative: 6 % (ref 3–12)
NEUTROS ABS: 3.8 10*3/uL (ref 1.7–7.7)
NEUTROS PCT: 66 % (ref 43–77)
PLATELETS: 266 10*3/uL (ref 150–400)
RBC: 4.57 MIL/uL (ref 3.87–5.11)
RDW: 13.3 % (ref 11.5–15.5)
WBC: 5.8 10*3/uL (ref 4.0–10.5)

## 2015-02-04 LAB — BASIC METABOLIC PANEL
BUN: 8 mg/dL (ref 6–23)
CO2: 29 meq/L (ref 19–32)
Calcium: 9.4 mg/dL (ref 8.4–10.5)
Chloride: 104 mEq/L (ref 96–112)
Creat: 0.84 mg/dL (ref 0.50–1.10)
GLUCOSE: 98 mg/dL (ref 70–99)
POTASSIUM: 4.4 meq/L (ref 3.5–5.3)
Sodium: 140 mEq/L (ref 135–145)

## 2015-02-28 ENCOUNTER — Telehealth: Payer: Self-pay | Admitting: Internal Medicine

## 2015-02-28 NOTE — Telephone Encounter (Signed)
Pt said that her PCP told her that she has a upper respiratory infection and she is still having problems with reflux and her stomach swelling. She said that she took vancomycin and her PCP told her to half her prevacid from 30 mg to 15 mg and she wants to know should she do that or is there something else she can take. She has OV with RMR in May and said that she can't wait that long because she is still having problems and hurting. She had right many concerns and multiple questions and I told her that I would have to document this and send it to the nurse and she would get back with her with RMR recommendations. Please call her at [phone]

## 2015-03-01 NOTE — Telephone Encounter (Signed)
Pt is aware.  

## 2015-03-01 NOTE — Telephone Encounter (Signed)
Three choices. Prefer #1 because of her h/o C.diff.  1: take prevacid 15mg  30 minutes before breakfast and Pepcid at bedtime 2: take prevacid 15mg  BID 30 minutes before meals.  3: take prevacid 30mg  daily before breakfast.

## 2015-03-01 NOTE — Telephone Encounter (Signed)
I spoke with the pt- she recently had an URI and was not given any abx but she was given 2 DepoMedrol injections and put on prednisone. She has finished the prednisone. She has also finished the vanc. She still having reflux issues and has tried prevacid 15mg  and it is not working well for the pt.  She is feeling a little better now except for she is sore in her lower abd and the reflux.    She wants to know if she can go back on the prevacid 30mg ?

## 2015-03-24 ENCOUNTER — Other Ambulatory Visit: Payer: Self-pay

## 2015-03-24 MED ORDER — HYDROCORTISONE ACETATE 25 MG RE SUPP: 25.0000 mg | Freq: Two times a day (BID) | RECTAL | Status: DC | PRN

## 2015-03-31 ENCOUNTER — Emergency Department (HOSPITAL_COMMUNITY): Payer: Medicare Other

## 2015-03-31 ENCOUNTER — Telehealth: Payer: Self-pay | Admitting: General Practice

## 2015-03-31 ENCOUNTER — Telehealth: Payer: Self-pay | Admitting: Gastroenterology

## 2015-03-31 ENCOUNTER — Encounter (HOSPITAL_COMMUNITY): Payer: Self-pay

## 2015-03-31 ENCOUNTER — Emergency Department (HOSPITAL_COMMUNITY)
Admission: EM | Admit: 2015-03-31 | Discharge: 2015-03-31 | Disposition: A | Discharge status: Home / Self Care | Payer: Medicare Other | Attending: Emergency Medicine | Admitting: Emergency Medicine

## 2015-03-31 DIAGNOSIS — Z880 Allergy status to penicillin: Secondary | ICD-10-CM | POA: Diagnosis not present

## 2015-03-31 DIAGNOSIS — Z8619 Personal history of other infectious and parasitic diseases: Secondary | ICD-10-CM | POA: Insufficient documentation

## 2015-03-31 DIAGNOSIS — K5732 Diverticulitis of large intestine without perforation or abscess without bleeding: Secondary | ICD-10-CM

## 2015-03-31 DIAGNOSIS — I10 Essential (primary) hypertension: Secondary | ICD-10-CM | POA: Insufficient documentation

## 2015-03-31 DIAGNOSIS — Z79899 Other long term (current) drug therapy: Secondary | ICD-10-CM | POA: Diagnosis not present

## 2015-03-31 DIAGNOSIS — Z862 Personal history of diseases of the blood and blood-forming organs and certain disorders involving the immune mechanism: Secondary | ICD-10-CM | POA: Insufficient documentation

## 2015-03-31 DIAGNOSIS — R1084 Generalized abdominal pain: Secondary | ICD-10-CM | POA: Diagnosis present

## 2015-03-31 LAB — URINE MICROSCOPIC-ADD ON

## 2015-03-31 LAB — URINALYSIS, ROUTINE W REFLEX MICROSCOPIC
Bilirubin Urine: NEGATIVE
Glucose, UA: NEGATIVE mg/dL
Nitrite: NEGATIVE
Protein, ur: NEGATIVE mg/dL
Specific Gravity, Urine: 1.01 (ref 1.005–1.030)
Urobilinogen, UA: 0.2 mg/dL (ref 0.0–1.0)
pH: 6 (ref 5.0–8.0)

## 2015-03-31 LAB — CBC WITH DIFFERENTIAL/PLATELET
BASOS ABS: 0 10*3/uL (ref 0.0–0.1)
BASOS PCT: 0 % (ref 0–1)
EOS PCT: 1 % (ref 0–5)
Eosinophils Absolute: 0.1 10*3/uL (ref 0.0–0.7)
HCT: 38.4 % (ref 36.0–46.0)
Hemoglobin: 12.8 g/dL (ref 12.0–15.0)
LYMPHS PCT: 11 % — AB (ref 12–46)
Lymphs Abs: 1.2 10*3/uL (ref 0.7–4.0)
MCH: 29.4 pg (ref 26.0–34.0)
MCHC: 33.3 g/dL (ref 30.0–36.0)
MCV: 88.3 fL (ref 78.0–100.0)
Monocytes Absolute: 0.8 10*3/uL (ref 0.1–1.0)
Monocytes Relative: 7 % (ref 3–12)
Neutro Abs: 8.9 10*3/uL — ABNORMAL HIGH (ref 1.7–7.7)
Neutrophils Relative %: 81 % — ABNORMAL HIGH (ref 43–77)
Platelets: 240 10*3/uL (ref 150–400)
RBC: 4.35 MIL/uL (ref 3.87–5.11)
RDW: 13.9 % (ref 11.5–15.5)
WBC: 11 10*3/uL — ABNORMAL HIGH (ref 4.0–10.5)

## 2015-03-31 LAB — COMPREHENSIVE METABOLIC PANEL
ALT: 11 U/L — AB (ref 14–54)
AST: 12 U/L — AB (ref 15–41)
Albumin: 4.2 g/dL (ref 3.5–5.0)
Alkaline Phosphatase: 90 U/L (ref 38–126)
Anion gap: 9 (ref 5–15)
BUN: 16 mg/dL (ref 6–20)
CO2: 26 mmol/L (ref 22–32)
Calcium: 9 mg/dL (ref 8.9–10.3)
Chloride: 104 mmol/L (ref 101–111)
Creatinine, Ser: 0.83 mg/dL (ref 0.44–1.00)
GFR calc Af Amer: >60 mL/min (ref >60)
GFR calc non Af Amer: >60 mL/min (ref >60)
GLUCOSE: 136 mg/dL — AB (ref 65–99)
Potassium: 3.7 mmol/L (ref 3.5–5.1)
Sodium: 139 mmol/L (ref 135–145)
Total Bilirubin: 0.6 mg/dL (ref 0.3–1.2)
Total Protein: 7.4 g/dL (ref 6.5–8.1)

## 2015-03-31 LAB — LIPASE, BLOOD: LIPASE: 24 U/L (ref 22–51)

## 2015-03-31 MED ORDER — CIPROFLOXACIN IN D5W 400 MG/200ML IV SOLN
400.0000 mg | Freq: Once | INTRAVENOUS | Status: AC
  Administered 2015-03-31: 400 mg via INTRAVENOUS
  Filled 2015-03-31: qty 200

## 2015-03-31 MED ORDER — ONDANSETRON HCL 4 MG/2ML IJ SOLN
4.0000 mg | Freq: Once | INTRAMUSCULAR | Status: AC
  Administered 2015-03-31: 4 mg via INTRAVENOUS

## 2015-03-31 MED ORDER — METRONIDAZOLE 500 MG PO TABS: 500.0000 mg | ORAL_TABLET | Freq: Three times a day (TID) | ORAL | Status: DC

## 2015-03-31 MED ORDER — SACCHAROMYCES BOULARDII 250 MG PO CAPS: 250.0000 mg | ORAL_CAPSULE | Freq: Two times a day (BID) | ORAL | Status: DC

## 2015-03-31 MED ORDER — ONDANSETRON HCL 4 MG/2ML IJ SOLN
4.0000 mg | Freq: Once | INTRAMUSCULAR | Status: DC
  Filled 2015-03-31: qty 2

## 2015-03-31 MED ORDER — ONDANSETRON HCL 4 MG/2ML IJ SOLN
4.0000 mg | Freq: Once | INTRAMUSCULAR | Status: AC
  Administered 2015-03-31: 4 mg via INTRAVENOUS
  Filled 2015-03-31: qty 2

## 2015-03-31 MED ORDER — OXYCODONE-ACETAMINOPHEN 5-325 MG PO TABS: 1.0000 | ORAL_TABLET | Freq: Four times a day (QID) | ORAL | Status: DC | PRN

## 2015-03-31 MED ORDER — CIPROFLOXACIN HCL 500 MG PO TABS: 500.0000 mg | ORAL_TABLET | Freq: Two times a day (BID) | ORAL | Status: DC

## 2015-03-31 MED ORDER — METRONIDAZOLE IN NACL 5-0.79 MG/ML-% IV SOLN
500.0000 mg | Freq: Once | INTRAVENOUS | Status: AC
  Administered 2015-03-31: 500 mg via INTRAVENOUS
  Filled 2015-03-31: qty 100

## 2015-03-31 MED ORDER — IOHEXOL 300 MG/ML  SOLN
50.0000 mL | Freq: Once | INTRAMUSCULAR | Status: AC | PRN
  Administered 2015-03-31: 50 mL via ORAL

## 2015-03-31 MED ORDER — IOHEXOL 300 MG/ML  SOLN
100.0000 mL | Freq: Once | INTRAMUSCULAR | Status: AC | PRN
  Administered 2015-03-31: 100 mL via INTRAVENOUS

## 2015-03-31 MED ORDER — MORPHINE SULFATE 4 MG/ML IJ SOLN
4.0000 mg | Freq: Once | INTRAMUSCULAR | Status: AC
Start: 2015-03-31 — End: 2015-03-31
  Administered 2015-03-31: 4 mg via INTRAVENOUS
  Filled 2015-03-31: qty 1

## 2015-03-31 MED ORDER — OXYCODONE-ACETAMINOPHEN 5-325 MG PO TABS
1.0000 | ORAL_TABLET | Freq: Once | ORAL | Status: AC
  Administered 2015-03-31: 1 via ORAL
  Filled 2015-03-31: qty 1

## 2015-03-31 MED ORDER — ONDANSETRON 4 MG PO TBDP: 4.0000 mg | ORAL_TABLET | Freq: Three times a day (TID) | ORAL | Status: DC | PRN

## 2015-03-31 NOTE — Discharge Instructions (Signed)

## 2015-03-31 NOTE — Telephone Encounter (Signed)
PT CALLED. 3 days abdominal pain & diarrhea. Pt instructed to got to the ED.

## 2015-03-31 NOTE — Telephone Encounter (Signed)
Pt is aware.  

## 2015-03-31 NOTE — Telephone Encounter (Signed)
Pt was given rx's for cipro, flagyl, percocet and zofran in the ED.

## 2015-03-31 NOTE — ED Notes (Signed)
   03/31/15 0045  Abdominal  Gastrointestinal (WDL) X  Abdomen Inspection Soft  Tenderness Nontender  Last Bowel Movement Date 03/30/15  pt states abdominal pain that has hurting for the past few days. Pt has some nausea but denies vomiting.

## 2015-03-31 NOTE — Telephone Encounter (Signed)
Patient went to the [facility] ED and was diagnosed with diverticulitis and she is now on a Abx.  She has a history cdiff and she was told she needed to call in if she ever had to take a Abx.    She took her last Probiotic the end of April.  Routing to [employer]

## 2015-03-31 NOTE — ED Provider Notes (Signed)
CSN: [id number]     Arrival date & time 03/31/15  0027 History  This chart was scribed for Merryl Hacker, MD by [place] Peace, ED Scribe. The patient was seen in [place]. The patient's care was started at 12:41 AM.    Chief Complaint  Patient presents with  . Abdominal Pain    Patient is a 69 y.o. female presenting with abdominal pain. The history is provided by the patient. No language interpreter was used.  Abdominal Pain Associated symptoms: nausea   Associated symptoms: no chest pain, no constipation, no diarrhea, no dysuria, no fever, no shortness of breath and no vomiting   HPI Comments: Kaitlin May is a 69 y.o. female with history of Diverticulitis, C. Diff colitis, HTN, migraines presents to the Emergency Department complaining of severe generalized abdominal pain x 3 days with nausea. She states pain is exacerbated with movement and rates it as 8/10. States "this feels like my diverticulitis." Denies vomiting or diarrhea.  Reports a low-grade fever. Pt reports she was seen by her PCP yesterday regarding same symptoms as well as "my reflux" and was given a shot of Phenergan but denies any improvement in pain. No complaints of vomiting, diarrhea, or urinary symptoms. History of GERD and hypertension. Allergies to Prednisone and Penicillin. Pt is non-smoker.   Of note, patient recently finished a course of vancomycin for C. difficile. Had 2 subsequent C. difficile tests that were negative.  GI: Dr. Gala Romney   Past Medical History  Diagnosis Date  . Acoustic neuroma     in situ  . HTN (hypertension)   . Migraines     uses demerol and phenergan for migraines due to severity of pain, nausea  . Seasonal allergies   . GERD (gastroesophageal reflux disease)   . 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 Gala Romney  . Diverticula of colon   . Diverticulitis      Negative CTs in 2004, 2009, 2011, 2013. Positive for sigmoid diverticulitis in 03/2009  . C. difficile colitis   .  Rectal fistula    Past Surgical History  Procedure Laterality Date  . Tonsillectomy    . Appendectomy    . Colonoscopy  03/25/11    Dr. Elwyn Reach sided diverticula, normal rectum, tubular adenoma. Due for next TCS 03/2018.   . Oophorectomy      LSO  . Abdominal hysterectomy      endometrial hyperplasia, irreg bleeding   Family History  Problem Relation Age of Onset  . Melanoma Father     deceased at 16  . Diabetes Mother   . Dementia Mother     deceased at 40  . Colon cancer Neg Hx    History  Substance Use Topics  . Smoking status: Never Smoker   . Smokeless tobacco: Never Used  . Alcohol Use: No   OB History    Gravida Para Term Preterm AB TAB SAB Ectopic Multiple Living   2 2        2      Review of Systems  Constitutional: Negative for fever.  Respiratory: Negative for chest tightness and shortness of breath.   Cardiovascular: Negative for chest pain.  Gastrointestinal: Positive for nausea and abdominal pain. Negative for vomiting, diarrhea, constipation and blood in stool.  Genitourinary: Negative for dysuria.  Musculoskeletal: Negative for back pain.  Skin: Negative for wound.  Neurological: Negative for headaches.  Psychiatric/Behavioral: Negative for confusion.  All other systems reviewed and are negative.  Allergies  Penicillins and Prednisone  Home Medications   Prior to Admission medications   Medication Sig Start Date End Date Taking? Authorizing Provider  butalbital-acetaminophen-caffeine (FIORICET, ESGIC) 50-325-40 MG per tablet Take 2 tablets by mouth as needed.      Historical Provider, MD  ciprofloxacin (CIPRO) 500 MG tablet Take 1 tablet (500 mg total) by mouth 2 (two) times daily. 03/31/15   Merryl Hacker, MD  dicyclomine (BENTYL) 10 MG capsule Take 1 capsule (10 mg total) by mouth 4 (four) times daily as needed for spasms. 01/18/15   Mahala Menghini, PA-C  famotidine (PEPCID) 40 MG tablet Take 1 tablet (40 mg total) by mouth 2 (two) times  daily. 01/18/15   Mahala Menghini, PA-C  HYDROcodone-acetaminophen (NORCO/VICODIN) 5-325 MG per tablet Take 1 tablet by mouth every 6 (six) hours as needed for moderate pain. Patient not taking: Reported on 02/03/2015 01/18/15   Mahala Menghini, PA-C  hydrocortisone (ANUSOL-HC) 2.5 % rectal cream Place 1 application rectally 2 (two) times daily. Patient not taking: Reported on 02/03/2015 01/18/15   Mahala Menghini, PA-C  hydrocortisone (ANUSOL-HC) 25 MG suppository Place 1 suppository (25 mg total) rectally 2 (two) times daily as needed for hemorrhoids or itching. 03/24/15   Carlis Stable, NP  LORazepam (ATIVAN) 1 MG tablet Take 1 mg by mouth daily as needed.      Historical Provider, MD  metroNIDAZOLE (FLAGYL) 500 MG tablet Take 1 tablet (500 mg total) by mouth 3 (three) times daily. 03/31/15   Merryl Hacker, MD  ondansetron (ZOFRAN ODT) 4 MG disintegrating tablet Take 1 tablet (4 mg total) by mouth every 8 (eight) hours as needed for nausea or vomiting. 03/31/15   Merryl Hacker, MD  oxyCODONE-acetaminophen (PERCOCET/ROXICET) 5-325 MG per tablet Take 1 tablet by mouth every 6 (six) hours as needed for severe pain. 03/31/15   Merryl Hacker, MD  promethazine (PHENERGAN) 25 MG suppository Place 25 mg rectally every 6 (six) hours as needed.      Historical Provider, MD  Saccharomyces boulardii (FLORASTOR PO) Take by mouth.    Historical Provider, MD  sucralfate (CARAFATE) 1 GM/10ML suspension Take 10 mLs (1 g total) by mouth 4 (four) times daily. Patient not taking: Reported on 02/03/2015 01/23/15   Orvil Feil, NP  vancomycin (VANCOCIN) 125 MG capsule Take 125 mg by mouth 2 (two) times daily.     Historical Provider, MD   BP 97/60 mmHg  Pulse 79  Temp(Src) 98.2 F (36.8 C) (Oral)  Resp 16  SpO2 96% Physical Exam  Constitutional: She is oriented to person, place, and time. She appears well-developed and well-nourished.  HENT:  Head: Normocephalic and atraumatic.  Cardiovascular: Normal rate and regular  rhythm.   Pulmonary/Chest: Effort normal. No respiratory distress.  Abdominal: Soft. Bowel sounds are normal. There is tenderness. There is no rebound and no guarding.  Diffuse lower abdominal tenderness without rebound or guarding  Neurological: She is alert and oriented to person, place, and time.  Skin: Skin is warm and dry.  Psychiatric: She has a normal mood and affect.  Nursing note and vitals reviewed.   ED Course  Procedures (including critical care time) Labs Review Labs Reviewed  CBC WITH DIFFERENTIAL/PLATELET - Abnormal; Notable for the following:    WBC 11.0 (*)    Neutrophils Relative % 81 (*)    Neutro Abs 8.9 (*)    Lymphocytes Relative 11 (*)    All other components within normal  limits  COMPREHENSIVE METABOLIC PANEL - Abnormal; Notable for the following:    Glucose, Bld 136 (*)    AST 12 (*)    ALT 11 (*)    All other components within normal limits  URINALYSIS, ROUTINE W REFLEX MICROSCOPIC - Abnormal; Notable for the following:    Hgb urine dipstick SMALL (*)    Ketones, ur TRACE (*)    Leukocytes, UA TRACE (*)    All other components within normal limits  LIPASE, BLOOD  URINE MICROSCOPIC-ADD ON    Imaging Review Ct Abdomen Pelvis W Contrast  03/31/2015   CLINICAL DATA:  Severe generalized abdominal pain for 3 days with nausea.  EXAM: CT ABDOMEN AND PELVIS WITH CONTRAST  TECHNIQUE: Multidetector CT imaging of the abdomen and pelvis was performed using the standard protocol following bolus administration of intravenous contrast.  CONTRAST:  45mL OMNIPAQUE IOHEXOL 300 MG/ML SOLN, 187mL OMNIPAQUE IOHEXOL 300 MG/ML SOLN  COMPARISON:  01/26/2015  FINDINGS: There is acute inflammatory change surrounding the midportion of the sigmoid colon with appearances typical of acute diverticulitis. There is no abscess. There is no bowel obstruction. There is no free intraperitoneal air.  There are normal appearances of the liver, spleen, pancreas, adrenals and kidneys.  The  abdominal aorta is normal in caliber. There is no atherosclerotic calcification. There is no adenopathy in the abdomen or pelvis.  There is no significant abnormality in the lower chest.  IMPRESSION: Acute sigmoid diverticulitis   Electronically Signed   By: Andreas Newport M.D.   On: 03/31/2015 02:41     EKG Interpretation None     Medications  ondansetron (ZOFRAN) injection 4 mg (not administered)  morphine 4 MG/ML injection 4 mg (4 mg Intravenous Given 03/31/15 0104)  ondansetron (ZOFRAN) injection 4 mg (4 mg Intravenous Given 03/31/15 0101)  iohexol (OMNIPAQUE) 300 MG/ML solution 50 mL (50 mLs Oral Contrast Given 03/31/15 0217)  iohexol (OMNIPAQUE) 300 MG/ML solution 100 mL (100 mLs Intravenous Contrast Given 03/31/15 0217)  ciprofloxacin (CIPRO) IVPB 400 mg (0 mg Intravenous Stopped 03/31/15 0512)  metroNIDAZOLE (FLAGYL) IVPB 500 mg (0 mg Intravenous Stopped 03/31/15 0411)  oxyCODONE-acetaminophen (PERCOCET/ROXICET) 5-325 MG per tablet 1 tablet (1 tablet Oral Given 03/31/15 0320)    12:47 AM- Treatment plan was discussed with patient who verbalizes understanding and agrees.   MDM   Final diagnoses:  Diverticulitis of large intestine without perforation or abscess without bleeding    Patient presents with abdominal pain. States that it feels like her diverticular pain. Nontoxic on exam. No signs of peritonitis. Tender without rebound or guarding. Basic labwork obtained. Mild leukocytosis.  Otherwise labs are reassuring. CT scan shows evidence of acute uncomplicated diverticulitis. Patient given Cipro and Flagyl. She was transitioned to oral pain medication. Discussed with patient outpatient management including antibiotics, pain medicine, and nausea medication. She is follow-up with Dr. Gala Romney as soon as possible. Patient was given strict return precautions.  After history, exam, and medical workup I feel the patient has been appropriately medically screened and is safe for discharge home.  Pertinent diagnoses were discussed with the patient. Patient was given return precautions.  I personally performed the services described in this documentation, which was scribed in my presence. The recorded information has been reviewed and is accurate.    Merryl Hacker, MD 03/31/15 [phone]

## 2015-03-31 NOTE — ED Notes (Signed)
abd pain x a couple of days, reports nausea and vomiting.

## 2015-03-31 NOTE — Addendum Note (Signed)
Addended by: Mahala Menghini on: 03/31/2015 10:17 AM   Modules accepted: Orders

## 2015-03-31 NOTE — ED Notes (Signed)
Pt alert & oriented x4, stable gait. Patient given discharge instructions, paperwork & prescription(s). Patient informed not to drive, operate any equipment & handel any important documents 4 hours after taking pain medication. Patient  instructed to stop at the registration desk to finish any additional paperwork. Patient  verbalized understanding. Pt left department w/ no further questions. 

## 2015-03-31 NOTE — Telephone Encounter (Signed)
Reviewed CT, acute sigmoid diverticulitis.  Suggest going back on Florastor 250mg  twice daily until one month after antibiotics complete. Call with any problems.

## 2015-04-03 NOTE — Telephone Encounter (Signed)
Correction patient is taking her Zofran every 3 hours

## 2015-04-03 NOTE — Telephone Encounter (Signed)
Patient called in and stated that she has been taking the Zofran every 6 hours, Probiotic twice a day and it's still not helping with her upset stomach.  She wanted to know if there is anything else she can do.  Routing to [place] for advice.

## 2015-04-04 ENCOUNTER — Telehealth: Payer: Self-pay | Admitting: Internal Medicine

## 2015-04-04 MED ORDER — PROMETHAZINE HCL 12.5 MG PO TABS: 12.5000 mg | ORAL_TABLET | Freq: Four times a day (QID) | ORAL | Status: DC | PRN

## 2015-04-04 MED ORDER — ONDANSETRON 4 MG PO TBDP: ORAL_TABLET | ORAL | Status: DC

## 2015-04-04 NOTE — Telephone Encounter (Addendum)
Spoke with Kaitlin May, patient complaining of nausea.  She has an appointment Friday with Dr. Gala Romney which she needs to keep.  Recommend she take her Cipro and Flagyl WITH food. Limit pain medication, can contribute to her nausea. If need to take pain medication, make sure to take with food. We can increase her Zofran to 1-2 every 8 hours for nausea BUT SHE SHOULD NOT TAKE MORE THAN 8MG  IN 8 HOURS! We can add phenergan to alternate with her Zofran to control nausea.  RX for Zofran and phenergan done.

## 2015-04-04 NOTE — Telephone Encounter (Signed)
zofran printed and it has been faxed to the pharmacy.

## 2015-04-04 NOTE — Telephone Encounter (Signed)
Pt is aware. She will call or go to the ED if she worsens. She is aware to keep her ov with RMR on Friday.

## 2015-04-04 NOTE — Telephone Encounter (Signed)
I spoke with the pt. See other phone note. 

## 2015-04-04 NOTE — Addendum Note (Signed)
Addended by: Mahala Menghini on: 04/04/2015 08:44 AM   Modules accepted: Orders

## 2015-04-04 NOTE — Telephone Encounter (Signed)
Patient called again this morning. She said that she had called yesterday and hasn't heard back from [place]. I put patient on hold and spoke with JL. JL went to speak with LSL. I told patient that LSL was finishing up with her hospital notes and is aware of patient's concerns and will address them and nurse will be calling her back and she should keep her OV with RMR on Friday.

## 2015-04-07 ENCOUNTER — Encounter: Payer: Self-pay | Admitting: Internal Medicine

## 2015-04-07 ENCOUNTER — Ambulatory Visit (INDEPENDENT_AMBULATORY_CARE_PROVIDER_SITE_OTHER): Payer: Medicare Other | Admitting: Internal Medicine

## 2015-04-07 ENCOUNTER — Encounter (INDEPENDENT_AMBULATORY_CARE_PROVIDER_SITE_OTHER): Payer: Self-pay

## 2015-04-07 VITALS — BP 129/75 | HR 93 | Temp 97.8°F | Ht 68.0 in | Wt 169.8 lb

## 2015-04-07 DIAGNOSIS — R110 Nausea: Secondary | ICD-10-CM | POA: Diagnosis not present

## 2015-04-07 DIAGNOSIS — K219 Gastro-esophageal reflux disease without esophagitis: Secondary | ICD-10-CM | POA: Diagnosis not present

## 2015-04-07 NOTE — Patient Instructions (Signed)
Complete remaining week of Cipro and Flagyl  May continue to alternate phenergan and Zofran for nausea  Continue Prevacid 30 mg daily for GERD  Stop metamucil  Low residue diet  - information  You may need to have part of your colon resected once the acute inflammation has subsideed  Office visit with [place] in 2 weeks

## 2015-04-07 NOTE — Progress Notes (Signed)
Primary Care Physician:  Glenda Chroman., MD Primary Gastroenterologist:  Dr. Gala Romney  Pre-Procedure History & Physical: HPI:  Kaitlin May is a 69 y.o. female here for follow-up of diverticulitis. Sigmoid diverticulitis demonstrated on CT one week ago at [facility].  One week into a  2 week course of Cipro and Flagyl. Has frequent nausea but no vomiting. Continues take Metamucil daily. Takes one Percocet daily. Alternates Zofran and Phenergan suppositories with fairly good control of nausea. She is successful in managing her reflux symptoms with Prevacid 30 mg daily. She's had multiple clinical attacks of diverticulitis over the years. She's had multiple CTs. As far as I can tell, only 2 documented bouts of diverticulitis on CTs in 2010 in the current episode. No fever or chills currently. She is eating her regular diet.  Past Medical History  Diagnosis Date  . Acoustic neuroma     in situ  . HTN (hypertension)   . Migraines     uses demerol and phenergan for migraines due to severity of pain, nausea  . Seasonal allergies   . GERD (gastroesophageal reflux disease)   . 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 Gala Romney  . Diverticula of colon   . Diverticulitis      Negative CTs in 2004, 2009, 2011, 2013. Positive for sigmoid diverticulitis in 03/2009  . C. difficile colitis   . Rectal fistula     Past Surgical History  Procedure Laterality Date  . Tonsillectomy    . Appendectomy    . Colonoscopy  03/25/11    Dr. Elwyn Reach sided diverticula, normal rectum, tubular adenoma. Due for next TCS 03/2018.   . Oophorectomy      LSO  . Abdominal hysterectomy      endometrial hyperplasia, irreg bleeding    Prior to Admission medications   Medication Sig Start Date End Date Taking? Authorizing Provider  butalbital-acetaminophen-caffeine (FIORICET, ESGIC) 50-325-40 MG per tablet Take 2 tablets by mouth as needed.     Yes Historical Provider, MD  ciprofloxacin (CIPRO) 500 MG tablet Take  1 tablet (500 mg total) by mouth 2 (two) times daily. 03/31/15  Yes Merryl Hacker, MD  dicyclomine (BENTYL) 10 MG capsule Take 1 capsule (10 mg total) by mouth 4 (four) times daily as needed for spasms. 01/18/15  Yes Mahala Menghini, PA-C  famotidine (PEPCID) 40 MG tablet Take 1 tablet (40 mg total) by mouth 2 (two) times daily. 01/18/15  Yes Mahala Menghini, PA-C  HYDROcodone-acetaminophen (NORCO/VICODIN) 5-325 MG per tablet Take 1 tablet by mouth every 6 (six) hours as needed for moderate pain. 01/18/15  Yes Mahala Menghini, PA-C  hydrocortisone (ANUSOL-HC) 2.5 % rectal cream Place 1 application rectally 2 (two) times daily. 01/18/15  Yes Mahala Menghini, PA-C  hydrocortisone (ANUSOL-HC) 25 MG suppository Place 1 suppository (25 mg total) rectally 2 (two) times daily as needed for hemorrhoids or itching. 03/24/15  Yes Carlis Stable, NP  LORazepam (ATIVAN) 1 MG tablet Take 1 mg by mouth daily as needed.     Yes Historical Provider, MD  metroNIDAZOLE (FLAGYL) 500 MG tablet Take 1 tablet (500 mg total) by mouth 3 (three) times daily. 03/31/15  Yes Merryl Hacker, MD  ondansetron (ZOFRAN ODT) 4 MG disintegrating tablet 1 to 2 every 8 hours as needed for nausea/vomiting. Alternate with phenergan. 04/04/15  Yes Mahala Menghini, PA-C  oxyCODONE-acetaminophen (PERCOCET/ROXICET) 5-325 MG per tablet Take 1 tablet by mouth every 6 (  six) hours as needed for severe pain. 03/31/15  Yes Merryl Hacker, MD  promethazine (PHENERGAN) 12.5 MG tablet Take 1 tablet (12.5 mg total) by mouth every 6 (six) hours as needed for nausea or vomiting (alternate with zofran). 04/04/15  Yes Mahala Menghini, PA-C  saccharomyces boulardii (FLORASTOR) 250 MG capsule Take 1 capsule (250 mg total) by mouth 2 (two) times daily. 03/31/15  Yes Mahala Menghini, PA-C  sucralfate (CARAFATE) 1 GM/10ML suspension Take 10 mLs (1 g total) by mouth 4 (four) times daily. 01/23/15  Yes Orvil Feil, NP  vancomycin (VANCOCIN) 125 MG capsule Take 125 mg by mouth 2 (two)  times daily.    Yes Historical Provider, MD  promethazine (PHENERGAN) 25 MG suppository Place 25 mg rectally every 6 (six) hours as needed.      Historical Provider, MD    Allergies as of 04/07/2015 - Review Complete 03/31/2015  Allergen Reaction Noted  . Penicillins Palpitations 03/04/2011  . Prednisone  01/18/2015    Family History  Problem Relation Age of Onset  . Melanoma Father     deceased at 40  . Diabetes Mother   . Dementia Mother     deceased at 62  . Colon cancer Neg Hx     History   Social History  . Marital Status: Married    Spouse Name: N/A  . Number of Children: N/A  . Years of Education: N/A   Occupational History  . Not on file.   Social History Main Topics  . Smoking status: Never Smoker   . Smokeless tobacco: Never Used  . Alcohol Use: No  . Drug Use: No  . Sexual Activity:    Partners: Male    Birth Control/ Protection:      Comment: 1st intercourse 27 yo-1 partner   Other Topics Concern  . Not on file   Social History Narrative    Review of Systems: See HPI, otherwise negative ROS  Physical Exam: BP 129/75 mmHg  Pulse 93  Temp(Src) 97.8 F (36.6 C) (Oral)  Ht 5\' 8"  (1.727 m)  Wt 169 lb 12.8 oz (77.021 kg)  BMI 25.82 kg/m2 General:   Alert,  fretful appearing lady accompanied by her husband. She appears in no acute distress. Skin:  Intact without significant lesions or rashes. Eyes:  Sclera clear, no icterus.   Conjunctiva pink. Ears:  Normal auditory acuity. Nose:  No deformity, discharge,  or lesions. Mouth:  No deformity or lesions. Neck:  Supple; no masses or thyromegaly. No significant cervical adenopathy. Lungs:  Clear throughout to auscultation.   No wheezes, crackles, or rhonchi. No acute distress. Heart:  Regular rate and rhythm; no murmurs, clicks, rubs,  or gallops. Abdomen: nondistended. Positive bowel sounds. Soft with minimal left lower quadrant tenderness to palpation. No rebound. No hepatosplenomegaly. Pulses:   Normal pulses noted. Extremities:  Without clubbing or edema.  Impression:  Recurrent sigmoid diverticulitis-uncomplicated at this point in time. Nausea a prominent symptom.  GERD symptoms well controlled.  Antibiotics previously contacted by C. Difficile infection.   Recommendations: Complete remaining week of Cipro and Flagyl; use Percocet sparingly.  May continue to alternate phenergan and Zofran for nausea  Continue Prevacid 30 mg daily for GERD  Stop metamucil  Low residue diet  - information  You may need to have part of your colon resected once the acute inflammation has subsided; in fact, I recommend elective surgical consultation following this acute illness.  Office visit with [place] in 2  weeks   Notice: This dictation was prepared with Dragon dictation along with smaller phrase technology. Any transcriptional errors that result from this process are unintentional and may not be corrected upon review.

## 2015-04-11 ENCOUNTER — Other Ambulatory Visit: Payer: Self-pay | Admitting: Internal Medicine

## 2015-04-11 ENCOUNTER — Telehealth: Payer: Self-pay | Admitting: General Practice

## 2015-04-11 MED ORDER — NYSTATIN 100000 UNIT/ML MT SUSP
5.0000 mL | Freq: Four times a day (QID) | OROMUCOSAL | Status: DC
Start: 2015-04-11 — End: 2015-04-24

## 2015-04-11 NOTE — Telephone Encounter (Signed)
Patient called in and stated that she has been taking the ABX prescribed by the ED for diverticulitis.  She is also taking a probiotic and alternating between Zofran and Phenergan for the nausea.  Her tongue has a thick white coating on it.  She wanted to know if she can have yogurt and what can she take for the coating on her tongue.   Routing to Dr. Gala Romney.

## 2015-04-11 NOTE — Addendum Note (Signed)
Addended by: Claudina Lick on: 04/11/2015 09:58 AM   Modules accepted: Orders

## 2015-04-11 NOTE — Telephone Encounter (Signed)
Please call in prescription for nystatin: 100,000 units per cc. 5 mL orally 4 times a day. Continue for 2 days after symptoms have resolved. Dispense 5 days worth. No refills. We are treating for oral candidiasis

## 2015-04-11 NOTE — Telephone Encounter (Signed)
rx has been sent in. Pt is aware.

## 2015-04-12 NOTE — Progress Notes (Signed)
CC'ED TO PCP 

## 2015-04-13 ENCOUNTER — Telehealth: Payer: Self-pay

## 2015-04-13 NOTE — Telephone Encounter (Signed)
Pt is aware of what RMR said 

## 2015-04-13 NOTE — Telephone Encounter (Signed)
PT is calling because she stopped take the metamucil like RMR told her to do but now she is constipated. She is wanting to know what she can do for the constipation. She also said that she will not have enough nystatin left to countine to take. Please advise

## 2015-04-13 NOTE — Telephone Encounter (Signed)
She should try MiraLAX 17 g orally at bedtime daily as needed for constipation. Also, she's probably taken all the nystatin she needs.

## 2015-04-24 ENCOUNTER — Encounter: Payer: Self-pay | Admitting: Gastroenterology

## 2015-04-24 ENCOUNTER — Telehealth: Payer: Self-pay | Admitting: Gastroenterology

## 2015-04-24 ENCOUNTER — Ambulatory Visit (INDEPENDENT_AMBULATORY_CARE_PROVIDER_SITE_OTHER): Payer: Medicare Other | Admitting: Gastroenterology

## 2015-04-24 VITALS — BP 128/77 | HR 83 | Temp 97.1°F | Ht 68.0 in | Wt 166.4 lb

## 2015-04-24 DIAGNOSIS — Z8619 Personal history of other infectious and parasitic diseases: Secondary | ICD-10-CM

## 2015-04-24 DIAGNOSIS — K5730 Diverticulosis of large intestine without perforation or abscess without bleeding: Secondary | ICD-10-CM

## 2015-04-24 DIAGNOSIS — K219 Gastro-esophageal reflux disease without esophagitis: Secondary | ICD-10-CM | POA: Diagnosis not present

## 2015-04-24 DIAGNOSIS — K5909 Other constipation: Secondary | ICD-10-CM

## 2015-04-24 DIAGNOSIS — R634 Abnormal weight loss: Secondary | ICD-10-CM

## 2015-04-24 DIAGNOSIS — K5900 Constipation, unspecified: Secondary | ICD-10-CM

## 2015-04-24 MED ORDER — METRONIDAZOLE 500 MG PO TABS: 500.0000 mg | ORAL_TABLET | Freq: Three times a day (TID) | ORAL | Status: DC

## 2015-04-24 MED ORDER — CIPROFLOXACIN HCL 500 MG PO TABS: 500.0000 mg | ORAL_TABLET | Freq: Two times a day (BID) | ORAL | Status: DC

## 2015-04-24 NOTE — Telephone Encounter (Signed)
  Please let patient know.   Discussed case with Dr. Gala Romney. Concern for smoldering diverticulitis in light of night sweats, recurrent abdominal pain.  -He advises another round of Cipro and Flagyl. RX was sent in.  -Low fiber diet while actively being treated for diverticulitis with slow gradual increase in dietary fiber thereafter. -Follow up in 2 weeks.   Dr. Gala Romney also recommends consultation with the general surgeon for their recommendations regarding possible elective colectomy for recurrent diverticulitis IF patient does not respond to this round of antibiotic's.

## 2015-04-24 NOTE — Assessment & Plan Note (Signed)
Recently completed antibiotic therapy for acute sigmoid diverticulitis, documented on CT scan in May. Few days of feeling almost normal. Now with recurrent night sweats, lower abdominal discomfort although benign abdominal exam today. Denies diarrhea, actually requiring MiraLAX.   Complicated GI history. She has been treated multiple times for clinical diverticulitis, twice last fall, multiple times over the last several years. 2 CT scans since 2010 with documented diverticulitis. Multiple CTs since 2004 negative for diverticulitis. Treated for C. difficile twice since December. Other issues include nausea, GERD, weight loss. Documented 40 pound weight loss since 2013. 20 pound weight loss since February 2016. I do not have antrum weights the patient states that all of her weight loss is been over the last 9 months.  Discussed case with Dr. Gala Romney. Concern for smoldering diverticulitis in light of night sweats, recurrent abdominal pain. He advises another round of Cipro and Flagyl. Low fiber diet while actively being treated for diverticulitis with slow gradual increase in dietary fiber thereafter.   Dr. Gala Romney also recommends consultation with the general surgeon for their recommendations regarding possible elective colectomy for recurrent diverticulitis IF patient does not respond to this round of antibiotic's.  We will have patient return for follow-up in 2 weeks.

## 2015-04-24 NOTE — Telephone Encounter (Signed)
LMOM to call.

## 2015-04-24 NOTE — Patient Instructions (Signed)
1. I will discuss your ongoing issues with Dr. Gala Romney and further recommendations to follow. 2. If you wake up with night sweats, take your temperature. 3. Take a probiotic every time you are on an antibiotic and continue for at least a month after completing the antibiotic. [employer] and [employer] are additional options if Rosana Fret is unaffordable.

## 2015-04-24 NOTE — Progress Notes (Signed)
Primary Care Physician: Glenda Chroman., MD  Primary Gastroenterologist:  Garfield Cornea, MD   Chief Complaint  Patient presents with  . Follow-up    HPI: Kaitlin May is a 69 y.o. female here for follow-up of diverticulitis. Seen in the office on 04/07/2015. CT earlier in May demonstrated sigmoid diverticulitis.  She has a history of recurrent abdominal pain treated empirically numerous times in the past for diverticulitis. Multiple CT scans over the past decade, one in 2010 and recent one in May 2016 showed acute diverticulitis. Other CTs were negative.  Patient has been struggling with abdominal pain relieved dating back to August 2015. 2 rounds of Cipro and Flagyl for suspected diverticulitis with no improvement at that time. CT in October 2015 with no diverticulitis. When she presented to our office in February she was complaining of weight loss, nocturnal sweats. She had been treated for C. difficile positive PCR in December initially with Flagyl but then with vancomycin for 2 weeks. Another positive PCR in February, treated with vancomycin taper at that time. Patient was seen in follow-up in March, felt to be doing a little bit better. We didn't see her for 2 months until she presented recently with acute diverticulitis. This makes her third round of anabiotic therapy specifically for diverticulitis since August.  Completed anti-biotics about a week ago. Felt like she was actually feeling better several days ago but the last 2 days she's concerned about a possible ongoing infection. She's had night sweats for the past 6+ months in the midst of clinical diverticulitis and C. difficile. She states that the night sweats had resolved on recent antibiotic's that started back 2 days ago.   Her nausea has improved since she's been back on Prevacid 30 mg daily. Suspect multifactorial including related to medications, diverticulitis, poorly controlled reflux. She can eat like she used to.  She states she has more abdominal discomfort and nausea when she eats. Complaining of some lower abdominal discomfort. Constipation more of the issue these days. Using MiraLAX as needed. One capful of MiraLAX seemed to cause more loose stools. Denies blood in the stool or melena.     TSH 10/2014. No cough. No SOB.  Vitals - 1 value per visit 04/24/2015 04/07/2015 03/31/2015 02/03/2015 01/18/2015  Weight (lb) 166.4 169.8  179.2 181.2  Height 5\' 8"  5\' 8"   5\' 8"  5\' 8"   BMI 25.31 25.82  27.25 27.56  VISIT REPORT        Vitals - 1 value per visit 01/11/2015 12/26/2014 04/28/2012  Weight (lb) 183 186.4 208.4  Height 5\' 8"  5\' 7"  5\' 8"   BMI 27.83 29.19 31.69  VISIT REPORT         Current Outpatient Prescriptions  Medication Sig Dispense Refill  . ANUCORT-HC 25 MG suppository PLACE 1 SUPPOSITORY RECTALLY 2 TIMES DAILY AS NEEDED FOR HEMORRHOIDS OR ITCHING. -PT. WANTS BRAND NAME ONLY -- DR DENIED REFILLS, PT NEEDS T 24 suppository 1  . butalbital-acetaminophen-caffeine (FIORICET, ESGIC) 50-325-40 MG per tablet Take 2 tablets by mouth as needed.      . lansoprazole (PREVACID) 30 MG capsule 30 mg daily.    Marland Kitchen LORazepam (ATIVAN) 1 MG tablet Take 1 mg by mouth daily as needed.      . promethazine (PHENERGAN) 12.5 MG tablet Take 1 tablet (12.5 mg total) by mouth every 6 (six) hours as needed for nausea or vomiting (alternate with zofran). 30 tablet 0  . promethazine (PHENERGAN) 25 MG suppository Place 25 mg  rectally every 6 (six) hours as needed.      . saccharomyces boulardii (FLORASTOR) 250 MG capsule Take 1 capsule (250 mg total) by mouth 2 (two) times daily. 60 capsule 1   No current facility-administered medications for this visit.    Allergies as of 04/24/2015 - Review Complete 04/24/2015  Allergen Reaction Noted  . Penicillins Palpitations 03/04/2011  . Prednisone  01/18/2015   Past Medical History  Diagnosis Date  . Acoustic neuroma     in situ  . HTN (hypertension)   . Migraines     uses  demerol and phenergan for migraines due to severity of pain, nausea  . Seasonal allergies   . GERD (gastroesophageal reflux disease)   . 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 Gala Romney  . Diverticula of colon   . Diverticulitis      Negative CTs in 2004, 2009, 2011, 2013. Positive for sigmoid diverticulitis in 03/2009  . C. difficile colitis   . Rectal fistula    Past Surgical History  Procedure Laterality Date  . Tonsillectomy    . Appendectomy    . Colonoscopy  03/25/11    Dr. Elwyn Reach sided diverticula, normal rectum, tubular adenoma. Due for next TCS 03/2018.   . Oophorectomy      LSO  . Abdominal hysterectomy      endometrial hyperplasia, irreg bleeding    ROS:  General: Negative for anorexia,  fever, chills, fatigue, weakness. 20 pound weight loss since 12/2014 ENT: Negative for hoarseness, difficulty swallowing , nasal congestion. CV: Negative for chest pain, angina, palpitations, dyspnea on exertion, peripheral edema.  Respiratory: Negative for dyspnea at rest, dyspnea on exertion, cough, sputum, wheezing.  GI: See history of present illness. GU:  Negative for dysuria, hematuria, urinary incontinence, urinary frequency, nocturnal urination.  Endo: see hpi  Physical Examination:   BP 128/77 mmHg  Pulse 83  Temp(Src) 97.1 F (36.2 C)  Ht 5\' 8"  (1.727 m)  Wt 166 lb 6.4 oz (75.479 kg)  BMI 25.31 kg/m2  General: Well-nourished, well-developed in no acute distress.  Eyes: No icterus. Mouth: Oropharyngeal mucosa moist and pink , no lesions erythema or exudate. Lungs: Clear to auscultation bilaterally.  Heart: Regular rate and rhythm, no murmurs rubs or gallops.  Abdomen: Bowel sounds are normal, nontender, nondistended, no hepatosplenomegaly or masses, nohernia , no rebound or guarding.  Vague abdominal bruit heard in right abdomen. Extremities: No lower extremity edema. No clubbing or deformities. Neuro: Alert and oriented x 4   Skin: Warm and dry, no jaundice.     Psych: Alert and cooperative, normal mood and affect.  Labs:  Lab Results  Component Value Date   WBC 11.0* 03/31/2015   HGB 12.8 03/31/2015   HCT 38.4 03/31/2015   MCV 88.3 03/31/2015   PLT 240 03/31/2015   Lab Results  Component Value Date   CREATININE 0.83 03/31/2015   BUN 16 03/31/2015   NA 139 03/31/2015   K 3.7 03/31/2015   CL 104 03/31/2015   CO2 26 03/31/2015   Lab Results  Component Value Date   ALT 11* 03/31/2015   AST 12* 03/31/2015   ALKPHOS 90 03/31/2015   BILITOT 0.6 03/31/2015    Imaging Studies: Ct Abdomen Pelvis W Contrast  03/31/2015   CLINICAL DATA:  Severe generalized abdominal pain for 3 days with nausea.  EXAM: CT ABDOMEN AND PELVIS WITH CONTRAST  TECHNIQUE: Multidetector CT imaging of the abdomen and pelvis was performed using the standard  protocol following bolus administration of intravenous contrast.  CONTRAST:  16mL OMNIPAQUE IOHEXOL 300 MG/ML SOLN, 191mL OMNIPAQUE IOHEXOL 300 MG/ML SOLN  COMPARISON:  01/26/2015  FINDINGS: There is acute inflammatory change surrounding the midportion of the sigmoid colon with appearances typical of acute diverticulitis. There is no abscess. There is no bowel obstruction. There is no free intraperitoneal air.  There are normal appearances of the liver, spleen, pancreas, adrenals and kidneys.  The abdominal aorta is normal in caliber. There is no atherosclerotic calcification. There is no adenopathy in the abdomen or pelvis.  There is no significant abnormality in the lower chest.  IMPRESSION: Acute sigmoid diverticulitis   Electronically Signed   By: Andreas Newport M.D.   On: 03/31/2015 02:41

## 2015-04-25 NOTE — Telephone Encounter (Signed)
Pt is aware.  

## 2015-04-25 NOTE — Telephone Encounter (Signed)
OV in 3 weeks with RMR ok.

## 2015-04-25 NOTE — Telephone Encounter (Signed)
We discussed this at her OV yesterday. If worsens, we can give her more Nystatin.

## 2015-04-25 NOTE — Telephone Encounter (Signed)
LMOM to call.

## 2015-04-25 NOTE — Telephone Encounter (Signed)
Pt returned call and is aware of the antibiotics and instructions.  She had picked up the prescriptions from the pharmacy yesterday and she will begin today.  She said she has some nausea, but she has nausea med now, she will call if she needs anymore. She has an appt with Dr. Gala Romney on 05/16/2015 at 10:00 AM, which is 3 weeks away and she wants to know if that is sufficient or does she still need to schedule appt in 2 weeks for followup. Please advise!

## 2015-04-25 NOTE — Telephone Encounter (Signed)
Pt is aware to just keep appt with Dr. Gala Romney. She said she also wanted to let Neil Crouch, PA know that she has an awful taste in her mouth and her tongue gets a little white when she is taking the antibiotics, but she will let us know if she has problems.

## 2015-04-28 ENCOUNTER — Telehealth: Payer: Self-pay

## 2015-04-28 NOTE — Telephone Encounter (Signed)
Pt called and states she is still having the same symptoms as before with her stomach and she is feeling like she is loosing weight.  Wants to know if she needs to be seen sooner than the office visit on the 28th with RMR

## 2015-04-28 NOTE — Telephone Encounter (Signed)
Just started another round of antibiotics four days ago. Continue for now.  Supplement diet with carnation instant breakfast, Boost, or Ensure. Try to drink 2-3 daily.  She really needs to see Dr. Gala Romney and the appointment she has 2 weeks from now is the fastest we can get her in here since he is out of town.   Call if worsening symptoms.

## 2015-04-30 ENCOUNTER — Emergency Department (HOSPITAL_COMMUNITY)
Admission: EM | Admit: 2015-04-30 | Discharge: 2015-04-30 | Disposition: A | Discharge status: Home / Self Care | Payer: Medicare Other | Attending: Emergency Medicine | Admitting: Emergency Medicine

## 2015-04-30 ENCOUNTER — Telehealth: Payer: Self-pay | Admitting: Gastroenterology

## 2015-04-30 ENCOUNTER — Emergency Department (HOSPITAL_COMMUNITY): Payer: Medicare Other

## 2015-04-30 ENCOUNTER — Encounter (HOSPITAL_COMMUNITY): Payer: Self-pay | Admitting: Emergency Medicine

## 2015-04-30 DIAGNOSIS — R1010 Upper abdominal pain, unspecified: Secondary | ICD-10-CM

## 2015-04-30 DIAGNOSIS — Z86018 Personal history of other benign neoplasm: Secondary | ICD-10-CM | POA: Insufficient documentation

## 2015-04-30 DIAGNOSIS — K5900 Constipation, unspecified: Secondary | ICD-10-CM | POA: Diagnosis not present

## 2015-04-30 DIAGNOSIS — Z8619 Personal history of other infectious and parasitic diseases: Secondary | ICD-10-CM | POA: Insufficient documentation

## 2015-04-30 DIAGNOSIS — Z880 Allergy status to penicillin: Secondary | ICD-10-CM | POA: Insufficient documentation

## 2015-04-30 DIAGNOSIS — Z79899 Other long term (current) drug therapy: Secondary | ICD-10-CM | POA: Insufficient documentation

## 2015-04-30 DIAGNOSIS — K219 Gastro-esophageal reflux disease without esophagitis: Secondary | ICD-10-CM | POA: Insufficient documentation

## 2015-04-30 DIAGNOSIS — R110 Nausea: Secondary | ICD-10-CM

## 2015-04-30 DIAGNOSIS — Z792 Long term (current) use of antibiotics: Secondary | ICD-10-CM | POA: Insufficient documentation

## 2015-04-30 DIAGNOSIS — I10 Essential (primary) hypertension: Secondary | ICD-10-CM | POA: Insufficient documentation

## 2015-04-30 DIAGNOSIS — G43909 Migraine, unspecified, not intractable, without status migrainosus: Secondary | ICD-10-CM | POA: Diagnosis not present

## 2015-04-30 DIAGNOSIS — R109 Unspecified abdominal pain: Secondary | ICD-10-CM | POA: Diagnosis present

## 2015-04-30 DIAGNOSIS — R1013 Epigastric pain: Secondary | ICD-10-CM | POA: Insufficient documentation

## 2015-04-30 LAB — COMPREHENSIVE METABOLIC PANEL
ALT: 13 U/L — AB (ref 14–54)
ANION GAP: 10 (ref 5–15)
AST: 16 U/L (ref 15–41)
Albumin: 4.5 g/dL (ref 3.5–5.0)
Alkaline Phosphatase: 91 U/L (ref 38–126)
BUN: 11 mg/dL (ref 6–20)
CO2: 27 mmol/L (ref 22–32)
Calcium: 9.4 mg/dL (ref 8.9–10.3)
Chloride: 103 mmol/L (ref 101–111)
Creatinine, Ser: 0.97 mg/dL (ref 0.44–1.00)
GFR, EST NON AFRICAN AMERICAN: 58 mL/min — AB (ref >60)
Glucose, Bld: 113 mg/dL — ABNORMAL HIGH (ref 65–99)
POTASSIUM: 4.1 mmol/L (ref 3.5–5.1)
SODIUM: 140 mmol/L (ref 135–145)
Total Bilirubin: 0.5 mg/dL (ref 0.3–1.2)
Total Protein: 7.4 g/dL (ref 6.5–8.1)

## 2015-04-30 LAB — CBC WITH DIFFERENTIAL/PLATELET
Basophils Absolute: 0.1 10*3/uL (ref 0.0–0.1)
Basophils Relative: 1 % (ref 0–1)
EOS PCT: 2 % (ref 0–5)
Eosinophils Absolute: 0.1 10*3/uL (ref 0.0–0.7)
HEMATOCRIT: 41.9 % (ref 36.0–46.0)
Hemoglobin: 13.6 g/dL (ref 12.0–15.0)
LYMPHS PCT: 23 % (ref 12–46)
Lymphs Abs: 1.2 10*3/uL (ref 0.7–4.0)
MCH: 29.1 pg (ref 26.0–34.0)
MCHC: 32.5 g/dL (ref 30.0–36.0)
MCV: 89.5 fL (ref 78.0–100.0)
MONO ABS: 0.5 10*3/uL (ref 0.1–1.0)
MONOS PCT: 8 % (ref 3–12)
NEUTROS ABS: 3.6 10*3/uL (ref 1.7–7.7)
Neutrophils Relative %: 66 % (ref 43–77)
Platelets: 267 10*3/uL (ref 150–400)
RBC: 4.68 MIL/uL (ref 3.87–5.11)
RDW: 14 % (ref 11.5–15.5)
WBC: 5.4 10*3/uL (ref 4.0–10.5)

## 2015-04-30 LAB — URINALYSIS, ROUTINE W REFLEX MICROSCOPIC
BILIRUBIN URINE: NEGATIVE
Glucose, UA: NEGATIVE mg/dL
HGB URINE DIPSTICK: NEGATIVE
Ketones, ur: NEGATIVE mg/dL
LEUKOCYTES UA: NEGATIVE
Nitrite: NEGATIVE
PH: 7 (ref 5.0–8.0)
Protein, ur: NEGATIVE mg/dL
Specific Gravity, Urine: <1.005 — ABNORMAL LOW (ref 1.005–1.030)
UROBILINOGEN UA: 0.2 mg/dL (ref 0.0–1.0)

## 2015-04-30 LAB — TROPONIN I: Troponin I: <0.03 ng/mL (ref <0.031)

## 2015-04-30 LAB — LIPASE, BLOOD: Lipase: 23 U/L (ref 22–51)

## 2015-04-30 MED ORDER — PROMETHAZINE HCL 25 MG/ML IJ SOLN
12.5000 mg | Freq: Once | INTRAMUSCULAR | Status: AC
  Administered 2015-04-30: 12.5 mg via INTRAVENOUS
  Filled 2015-04-30 (×2): qty 1

## 2015-04-30 MED ORDER — PROMETHAZINE HCL 25 MG/ML IJ SOLN
12.5000 mg | Freq: Once | INTRAMUSCULAR | Status: AC
  Administered 2015-04-30: 12.5 mg via INTRAVENOUS
  Filled 2015-04-30: qty 1

## 2015-04-30 MED ORDER — DIPHENHYDRAMINE HCL 50 MG/ML IJ SOLN
25.0000 mg | Freq: Once | INTRAMUSCULAR | Status: AC
  Administered 2015-04-30: 25 mg via INTRAVENOUS
  Filled 2015-04-30: qty 1

## 2015-04-30 MED ORDER — MORPHINE SULFATE 4 MG/ML IJ SOLN
4.0000 mg | Freq: Once | INTRAMUSCULAR | Status: DC
  Filled 2015-04-30: qty 1

## 2015-04-30 MED ORDER — METOCLOPRAMIDE HCL 5 MG/ML IJ SOLN
5.0000 mg | Freq: Once | INTRAMUSCULAR | Status: AC
  Administered 2015-04-30: 5 mg via INTRAVENOUS
  Filled 2015-04-30: qty 2

## 2015-04-30 MED ORDER — SODIUM CHLORIDE 0.9 % IV BOLUS (SEPSIS)
1000.0000 mL | Freq: Once | INTRAVENOUS | Status: AC
  Administered 2015-04-30: 1000 mL via INTRAVENOUS

## 2015-04-30 MED ORDER — IOHEXOL 300 MG/ML  SOLN
100.0000 mL | Freq: Once | INTRAMUSCULAR | Status: AC | PRN
  Administered 2015-04-30: 100 mL via INTRAVENOUS

## 2015-04-30 MED ORDER — IOHEXOL 300 MG/ML  SOLN
25.0000 mL | Freq: Once | INTRAMUSCULAR | Status: AC | PRN
  Administered 2015-04-30: 25 mL via ORAL

## 2015-04-30 NOTE — Discharge Instructions (Signed)
Abdominal Pain °Many things can cause abdominal pain. Usually, abdominal pain is not caused by a disease and will improve without treatment. It can often be observed and treated at home. Your health care provider will do a physical exam and possibly order blood tests and X-rays to help determine the seriousness of your pain. However, in many cases, more time must pass before a clear cause of the pain can be found. Before that point, your health care provider may not know if you need more testing or further treatment. °HOME CARE INSTRUCTIONS  °Monitor your abdominal pain for any changes. The following actions may help to alleviate any discomfort you are experiencing: °· Only take over-the-counter or prescription medicines as directed by your health care provider. °· Do not take laxatives unless directed to do so by your health care provider. °· Try a clear liquid diet (broth, tea, or water) as directed by your health care provider. Slowly move to a bland diet as tolerated. °SEEK MEDICAL CARE IF: °· You have unexplained abdominal pain. °· You have abdominal pain associated with nausea or diarrhea. °· You have pain when you urinate or have a bowel movement. °· You experience abdominal pain that wakes you in the night. °· You have abdominal pain that is worsened or improved by eating food. °· You have abdominal pain that is worsened with eating fatty foods. °· You have a fever. °SEEK IMMEDIATE MEDICAL CARE IF:  °· Your pain does not go away within 2 hours. °· You keep throwing up (vomiting). °· Your pain is felt only in portions of the abdomen, such as the right side or the left lower portion of the abdomen. °· You pass bloody or black tarry stools. °MAKE SURE YOU: °· Understand these instructions.   °· Will watch your condition.   °· Will get help right away if you are not doing well or get worse.   °Document Released: 08/14/2005 Document Revised: 11/09/2013 Document Reviewed: 07/14/2013 °[employer]® Patient Information  ©2015 [employer]. This information is not intended to replace advice given to you by your health care provider. Make sure you discuss any questions you have with your health care provider. ° °Nausea, Adult °Nausea is the feeling that you have an upset stomach or have to vomit. Nausea by itself is not likely a serious concern, but it may be an early sign of more serious medical problems. As nausea gets worse, it can lead to vomiting. If vomiting develops, there is the risk of dehydration.  °CAUSES  °· Viral infections. °· Food poisoning. °· Medicines. °· Pregnancy. °· Motion sickness. °· Migraine headaches. °· Emotional distress. °· Severe pain from any source. °· Alcohol intoxication. °HOME CARE INSTRUCTIONS °· Get plenty of rest. °· Ask your caregiver about specific rehydration instructions. °· Eat small amounts of food and sip liquids more often. °· Take all medicines as told by your caregiver. °SEEK MEDICAL CARE IF: °· You have not improved after 2 days, or you get worse. °· You have a headache. °SEEK IMMEDIATE MEDICAL CARE IF:  °· You have a fever. °· You faint. °· You keep vomiting or have blood in your vomit. °· You are extremely weak or dehydrated. °· You have dark or bloody stools. °· You have severe chest or abdominal pain. °MAKE SURE YOU: °· Understand these instructions. °· Will watch your condition. °· Will get help right away if you are not doing well or get worse. °Document Released: 12/12/2004 Document Revised: 07/29/2012 Document Reviewed: 07/17/2011 °ExitCare® Patient Information ©2015   [employer]. This information is not intended to replace advice given to you by your health care provider. Make sure you discuss any questions you have with your health care provider. ° °

## 2015-04-30 NOTE — Telephone Encounter (Signed)
Diverticulitis resolved on CT (done in ER today). Labs and U/A unremarkable.  Noted during OV that patient has abdominal bruit on the right. Will review CT with radiologist.

## 2015-04-30 NOTE — Telephone Encounter (Signed)
PT C/O NAUSEA/VOMITING.  TAKING CIPRO/FLAGYL. USE PHENERGAN WITH ZOFRAN. ZOFRAN NOT HELPING. DECLINED NEW RX FOR PHENERGAN. INSTRUCTED PT TO USE PHENERGAN  EVERY 6 HRS WITH ZOFRAN PRN. DRINK FLUIDS. USE CIB, ENSURE, OR BOOST TO MAINTAIN NUTRITION AND PREVENT FURTHER WEIGHT LOSS. DRINK FLUIDS. IF UNABLE TO TO KEEP FLUIDS DOWN OR FEELING WEAK, SHE SHOULD GO TO ED. DECLINED TO HAVE ME CALL THE ED AND LET THEM KNOW SHE'S COMING.

## 2015-04-30 NOTE — ED Provider Notes (Signed)
CSN: [id number]     Arrival date & time 04/30/15  1600 History   First MD Initiated Contact with Patient 04/30/15 1707     Chief Complaint  Patient presents with  . Abdominal Pain     (Consider location/radiation/quality/duration/timing/severity/associated sxs/prior Treatment) HPI  69 year old female with complex abdominal history over the past several months presents with recurrent abdominal pain. Her primary complaint appears to be mostly nausea without vomiting. She was started on Cipro and Flagyl 6 days ago by her gastroenterologist for probable diverticulitis given repeat symptoms. Despite that she is having more abdominal pain. Her urine has been dark but no dysuria. The patient has been taking Zofran and a Phenergan suppository but feels nauseated nonetheless. No fevers. She has had diverticulitis multiple times and is not sure if this pain is like her previous diverticulitis. Her pain is mostly epigastric and set of lower abdominal. Sometimes the pain radiates up into her chest but currently does not have chest pain. Never had shortness of breath.  Past Medical History  Diagnosis Date  . Acoustic neuroma     in situ  . HTN (hypertension)   . Migraines     uses demerol and phenergan for migraines due to severity of pain, nausea  . Seasonal allergies   . GERD (gastroesophageal reflux disease)   . 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 Gala Romney  . Diverticula of colon   . Diverticulitis      Negative CTs in 2004, 2009, 2011, 2013. Positive for sigmoid diverticulitis in 03/2009  . C. difficile colitis   . Rectal fistula    Past Surgical History  Procedure Laterality Date  . Tonsillectomy    . Appendectomy    . Colonoscopy  03/25/11    Dr. Elwyn Reach sided diverticula, normal rectum, tubular adenoma. Due for next TCS 03/2018.   . Oophorectomy      LSO  . Abdominal hysterectomy      endometrial hyperplasia, irreg bleeding   Family History  Problem Relation Age of Onset  .  Melanoma Father     deceased at 48  . Diabetes Mother   . Dementia Mother     deceased at 70  . Colon cancer Neg Hx    History  Substance Use Topics  . Smoking status: Never Smoker   . Smokeless tobacco: Never Used  . Alcohol Use: No   OB History    Gravida Para Term Preterm AB TAB SAB Ectopic Multiple Living   2 2        2      Review of Systems  Constitutional: Negative for fever.  Respiratory: Negative for shortness of breath.   Cardiovascular: Positive for chest pain.  Gastrointestinal: Positive for nausea, abdominal pain and constipation. Negative for vomiting.  Genitourinary: Negative for dysuria.  All other systems reviewed and are negative.     Allergies  Penicillins and Prednisone  Home Medications   Prior to Admission medications   Medication Sig Start Date End Date Taking? Authorizing Provider  ANUCORT-HC 25 MG suppository PLACE 1 SUPPOSITORY RECTALLY 2 TIMES DAILY AS NEEDED FOR HEMORRHOIDS OR ITCHING. -PT. WANTS BRAND NAME ONLY -- DR DENIED REFILLS, PT NEEDS T 04/11/15   Mahala Menghini, PA-C  butalbital-acetaminophen-caffeine (FIORICET, ESGIC) [phone] MG per tablet Take 2 tablets by mouth as needed.      Historical Provider, MD  ciprofloxacin (CIPRO) 500 MG tablet Take 1 tablet (500 mg total) by mouth 2 (two) times daily. 04/24/15  Mahala Menghini, PA-C  lansoprazole (PREVACID) 30 MG capsule 30 mg daily. 04/18/15   Historical Provider, MD  LORazepam (ATIVAN) 1 MG tablet Take 1 mg by mouth daily as needed.      Historical Provider, MD  metroNIDAZOLE (FLAGYL) 500 MG tablet Take 1 tablet (500 mg total) by mouth 3 (three) times daily. Take with food 04/24/15   Mahala Menghini, PA-C  promethazine (PHENERGAN) 12.5 MG tablet Take 1 tablet (12.5 mg total) by mouth every 6 (six) hours as needed for nausea or vomiting (alternate with zofran). 04/04/15   Mahala Menghini, PA-C  promethazine (PHENERGAN) 25 MG suppository Place 25 mg rectally every 6 (six) hours as needed.       Historical Provider, MD  saccharomyces boulardii (FLORASTOR) 250 MG capsule Take 1 capsule (250 mg total) by mouth 2 (two) times daily. 03/31/15   Mahala Menghini, PA-C   BP 130/86 mmHg  Pulse 115  Temp(Src) 98.1 F (36.7 C) (Oral)  Resp 20  Wt 165 lb 1 oz (74.872 kg)  SpO2 98% Physical Exam  Constitutional: She is oriented to person, place, and time. She appears well-developed and well-nourished.  HENT:  Head: Normocephalic and atraumatic.  Right Ear: External ear normal.  Left Ear: External ear normal.  Nose: Nose normal.  Eyes: Right eye exhibits no discharge. Left eye exhibits no discharge.  Cardiovascular: Regular rhythm and normal heart sounds.  Tachycardia present.   Pulmonary/Chest: Effort normal and breath sounds normal.  Abdominal: Soft. There is tenderness in the epigastric area.  Neurological: She is alert and oriented to person, place, and time.  Skin: Skin is warm and dry.  Nursing note and vitals reviewed.   ED Course  Procedures (including critical care time) Labs Review Labs Reviewed  COMPREHENSIVE METABOLIC PANEL - Abnormal; Notable for the following:    Glucose, Bld 113 (*)    ALT 13 (*)    GFR calc non Af Amer 58 (*)    All other components within normal limits  URINALYSIS, ROUTINE W REFLEX MICROSCOPIC (NOT AT [facility]) - Abnormal; Notable for the following:    Specific Gravity, Urine <1.005 (*)    All other components within normal limits  CBC WITH DIFFERENTIAL/PLATELET  LIPASE, BLOOD  TROPONIN I    Imaging Review Ct Abdomen Pelvis W Contrast  04/30/2015   CLINICAL DATA:  69 year old female with generalized abdominal and pelvic pain for 2 weeks. Recently started on antibiotics for recurrent diverticulitis but no improvement and pain, nausea or vomiting.  EXAM: CT ABDOMEN AND PELVIS WITH CONTRAST  TECHNIQUE: Multidetector CT imaging of the abdomen and pelvis was performed using the standard protocol following bolus administration of intravenous contrast.   CONTRAST:  171mL OMNIPAQUE IOHEXOL 300 MG/ML  SOLN  COMPARISON:  03/31/2015 and prior CTs  FINDINGS: Lower chest:  Unremarkable  Hepatobiliary: The liver and gallbladder are unremarkable. There is no evidence of biliary dilatation.  Pancreas: Unremarkable  Spleen: Unremarkable  Adrenals/Urinary Tract: Mild bilateral renal cortical thinning noted. The kidneys, adrenal glands and bladder are otherwise unremarkable.  Stomach/Bowel: There has been interval resolution of inflammation and wall thickening of the sigmoid colon. Descending and sigmoid colonic diverticulosis noted without evidence of diverticulitis. There is no evidence of focal wall thickening or bowel obstruction. A small hiatal hernia is noted.  Vascular/Lymphatic: Unremarkable. No enlarged lymph nodes or abdominal aortic aneurysm.  Reproductive: The patient is status post hysterectomy. There are no adnexal masses identified.  Other: No free fluid, abscess or pneumoperitoneum.  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 Degenerative changes and scoliosis of the lumbar spine again noted. No acute or suspicious bony abnormalities are identified.  IMPRESSION: Interval resolution of sigmoid diverticulitis from the prior study. No evidence of acute abnormality.  Small hiatal hernia.   Electronically Signed   By: Margarette Canada M.D.   On: 04/30/2015 20:36     EKG Interpretation None      ED ECG REPORT   Date: 04/30/2015  Rate: 84  Rhythm: sinus arrhythmia  QRS Axis: normal  Intervals: normal  ST/T Wave abnormalities: normal  Conduction Disutrbances:none  Narrative Interpretation:   Old EKG Reviewed: none available  I have personally reviewed the EKG tracing and agree with the computerized printout as noted.   MDM   Final diagnoses:  Nausea  Upper abdominal pain    Patient's main issue appears to be perpetual nausea. The patient has not had any vomiting in the ER. Her CT scan is unremarkable. No  ACS symptoms and has a benign ECG and normal troponin with several days of symptoms. Has been trying Zofran and Phenergan home with no relief. Was able to tolerate the oral contrast without vomiting. Have tried IV Phenergan as well as IV Reglan here in the ER. Given that she is not having vomiting while being in the ER for over 5 hours I do not feel that she needs inpatient admission or observation. I feel she can follow closely with her gastroenterologist as an outpatient.     Sherwood Gambler, MD 04/30/15 2141

## 2015-04-30 NOTE — ED Notes (Addendum)
PT stated she was started on Cipro and Flagyl at MD office this past Monday d/t recurrent Diverticulitis but states no relief and worsening in nausea and vomiting. PT c/o all over abdominal cramping and soreness x1-2 weeks.

## 2015-04-30 NOTE — ED Notes (Signed)
Dr Regenia Skeeter in to see pt.  Verbal order given to hold remaining Morphine and all of phenergan for not.  @nd  EKG done and given to MD.

## 2015-04-30 NOTE — ED Notes (Signed)
Discharge instructions given,education on diet and nausea management. pt demonstrated teach back and verbal understanding. No concerns voiced.

## 2015-04-30 NOTE — ED Notes (Signed)
MD at bedside. 

## 2015-05-01 NOTE — Telephone Encounter (Signed)
Please see the telephone encounter from [facility] over the weekend (04/30/15).  Recommendation regarding phenergan and zofran.  I also made acknowledgement of ER work up.   ?nausea worse due to the Flagyl? She can take Flagyl with food to see if that helps. Otherwise, consider stopping the Flagyl to see if that helps.  Keep OV with RMR as planned.   I plan on reviewing CT with Dr. Thornton Papas to see if blood vessels in abd are patent because she had audible bruit on last exam. I really doubt that is causing her nausea however.

## 2015-05-01 NOTE — Telephone Encounter (Signed)
Noted. Spoke with pt and she will call us back and let us know if the Flagyl is better with food.

## 2015-05-01 NOTE — Telephone Encounter (Signed)
Called pt and she states that she has been so sick over the weekend. She states that she went to the ER for being nauseated and sick.  She states that the ER gave her some fluids and some nausea medication. States that also did a CT scan and that the CT did not show diverticulitis.   Please advise

## 2015-05-02 ENCOUNTER — Telehealth: Payer: Self-pay

## 2015-05-02 NOTE — Telephone Encounter (Signed)
Yes. Please stop the Flagyl.

## 2015-05-02 NOTE — Telephone Encounter (Signed)
Pt is aware to stop taking the Flagyl.

## 2015-05-02 NOTE — Telephone Encounter (Signed)
Pt called back and she states that she is sick today even when she is taking her food. Wants to know if she should go ahead and stop the Flagyl

## 2015-05-03 NOTE — Progress Notes (Signed)
cc'd to pcp 

## 2015-05-04 LAB — PROTIME-INR: INR: 1 (ref 0.9–1.1)

## 2015-05-04 NOTE — Telephone Encounter (Signed)
Reviewed CT with Dr. Thornton Papas. Nothing to explain abd bruit. No atherosclerotic disease in the abdominal mesentery vasculature, no aneurysms.

## 2015-05-04 NOTE — Telephone Encounter (Signed)
noted 

## 2015-05-04 NOTE — Telephone Encounter (Signed)
I will call on Wednesday to follow up on getting those records.

## 2015-05-04 NOTE — Telephone Encounter (Signed)
Pt called and she states that she is not any better. She feels very fatigued and feels sick to her stomach. Pt states she took some miralax  this morning and that has worked but she is still in pain. States she feels like she is loosing weight and that she is not certain if she should take the Cipro.  Please advise.

## 2015-05-04 NOTE — Telephone Encounter (Signed)
Pt is calling again and would like for LSL call her. She is so sick and she can't take the of Anti-bx(Cipro). She does not want to listen to [place] she only want to talk to [place]. Please advise

## 2015-05-04 NOTE — Telephone Encounter (Signed)
Spoke with patient. She went to see her PCP. They are going to admit her for the nausea and inability to eat, weight loss.   We will request records once available from [facility] including D/C summary, labs, any xrays.  Manuela Schwartz, can you request records middle of next week. Should have some available by then.

## 2015-05-10 NOTE — Telephone Encounter (Signed)
I have requested records from [facility] and PCP

## 2015-05-11 NOTE — Telephone Encounter (Signed)
Reviewed records. Seen by Dr. Britta Mccreedy while hospitalized. He suspected symptoms related to IBS and recommended low-dose antidepressant.  05/04/2015: Creatinine 0.72, total bilirubin 0.2, alkaline phosphatase 83, AST 16.9, ALT 11, albumen 3.9, white blood cell count 5100, hemoglobin 12.5, platelets 273,000  MRI lumbar spine without contrast on 05/05/2015 moderate multilevel lumbar spondylosis without an acute osseous abnormality. Mild right foraminal stenosis at L2-L3 potentially affects the right L2 nerve.  CT of the chest without contrast on 05/04/2015 for weight loss. Few scattered tiny pulmonary nodules in the lungs bilaterally. In the posterior aspect of the right upper lobe abutting the pleural surface there is a 12 x 12 x 12 mm nodule. This could be postinfectious or inflammatory scarring however possibility of neoplasm cannot be excluded. Further evaluation with PET CT recommended.   Manuela Schwartz, can we request D/C summary and PET CT if available. You may want to wait until early next week because hadn't been dictated as of yesterday. Thanks!

## 2015-05-15 ENCOUNTER — Other Ambulatory Visit (HOSPITAL_COMMUNITY): Payer: Self-pay | Admitting: Internal Medicine

## 2015-05-15 DIAGNOSIS — R911 Solitary pulmonary nodule: Secondary | ICD-10-CM

## 2015-05-15 NOTE — Telephone Encounter (Signed)
Requested recent DC summary and PET CT from [facility] and PCP.

## 2015-05-16 ENCOUNTER — Encounter: Payer: Self-pay | Admitting: Internal Medicine

## 2015-05-16 ENCOUNTER — Other Ambulatory Visit: Payer: Self-pay

## 2015-05-16 ENCOUNTER — Ambulatory Visit (INDEPENDENT_AMBULATORY_CARE_PROVIDER_SITE_OTHER): Payer: Medicare Other | Admitting: Internal Medicine

## 2015-05-16 VITALS — BP 127/78 | HR 98 | Temp 97.6°F | Ht 68.0 in | Wt 167.0 lb

## 2015-05-16 DIAGNOSIS — K5792 Diverticulitis of intestine, part unspecified, without perforation or abscess without bleeding: Secondary | ICD-10-CM

## 2015-05-16 DIAGNOSIS — K5732 Diverticulitis of large intestine without perforation or abscess without bleeding: Secondary | ICD-10-CM

## 2015-05-16 DIAGNOSIS — K219 Gastro-esophageal reflux disease without esophagitis: Secondary | ICD-10-CM | POA: Diagnosis not present

## 2015-05-16 NOTE — Patient Instructions (Addendum)
Make referral to the GI clinic at [facility] - evaluate for recurrent bouts of diverticulitis - Would you recommend segmental resection of colon?  Office visit with [place] once she's been seen at [facility] and we have their records for review  Continue prevacid 30 mg daily

## 2015-05-16 NOTE — Progress Notes (Signed)
Primary Care Physician:  Glenda Chroman., MD Primary Gastroenterologist:  Dr. Gala Romney  Pre-Procedure History & Physical: HPI:  Kaitlin May is a 69 y.o. female here for abdominal pain, nausea and GERD. Reportedly hospitalized at [facility] last week. Pt seen by Dr. Britta Mccreedy over there.  Reportedly tod she has IBS.  lContinued on oral Cipro and Flagyl. Stop Flagyl because of nausea. Having no abdominal pain or diarrhea this time. Multiple bouts of diverticulitis over the past several years. 2 documented episodes in the past 6 years -one  earlier this year. Has quite a bit of free-floating anxiety. Apparently, has a new pulmonary nodule for which a PET scan orderd. Multiple antibiotic courses have been called by C. Difficile colitis previously.  Past Medical History  Diagnosis Date  . Acoustic neuroma     in situ  . HTN (hypertension)   . Migraines     uses demerol and phenergan for migraines due to severity of pain, nausea  . Seasonal allergies   . GERD (gastroesophageal reflux disease)   . 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 Gala Romney  . Diverticula of colon   . Diverticulitis      Negative CTs in 2004, 2009, 2011, 2013. Positive for sigmoid diverticulitis in 03/2009  . C. difficile colitis   . Rectal fistula     Past Surgical History  Procedure Laterality Date  . Tonsillectomy    . Appendectomy    . Colonoscopy  03/25/11    Dr. Elwyn Reach sided diverticula, normal rectum, tubular adenoma. Due for next TCS 03/2018.   . Oophorectomy      LSO  . Abdominal hysterectomy      endometrial hyperplasia, irreg bleeding    Prior to Admission medications   Medication Sig Start Date End Date Taking? Authorizing Provider  butalbital-acetaminophen-caffeine (FIORICET, ESGIC) 50-325-40 MG per tablet Take 2 tablets by mouth as needed.     Yes Historical Provider, MD  lansoprazole (PREVACID) 30 MG capsule 30 mg daily. 04/18/15  Yes Historical Provider, MD  LORazepam (ATIVAN) 1 MG tablet Take  1 mg by mouth daily as needed.     Yes Historical Provider, MD  oxyCODONE (OXY IR/ROXICODONE) 5 MG immediate release tablet  05/08/15  Yes Historical Provider, MD  promethazine (PHENERGAN) 12.5 MG tablet Take 1 tablet (12.5 mg total) by mouth every 6 (six) hours as needed for nausea or vomiting (alternate with zofran). 04/04/15  Yes Leslie S Lewis, PA-C  ANUCORT-HC 25 MG suppository PLACE 1 SUPPOSITORY RECTALLY 2 TIMES DAILY AS NEEDED FOR HEMORRHOIDS OR ITCHING. -PT. WANTS BRAND NAME ONLY -- DR DENIED REFILLS, PT NEEDS T Patient not taking: Reported on 05/16/2015 04/11/15   Mahala Menghini, PA-C  ciprofloxacin (CIPRO) 500 MG tablet Take 1 tablet (500 mg total) by mouth 2 (two) times daily. Patient not taking: Reported on 05/16/2015 04/24/15   Mahala Menghini, PA-C  metroNIDAZOLE (FLAGYL) 500 MG tablet Take 1 tablet (500 mg total) by mouth 3 (three) times daily. Take with food Patient not taking: Reported on 05/16/2015 04/24/15   Mahala Menghini, PA-C  promethazine (PHENERGAN) 25 MG suppository Place 25 mg rectally every 6 (six) hours as needed.      Historical Provider, MD  saccharomyces boulardii (FLORASTOR) 250 MG capsule Take 1 capsule (250 mg total) by mouth 2 (two) times daily. 03/31/15   Mahala Menghini, PA-C    Allergies as of 05/16/2015 - Review Complete 04/30/2015  Allergen Reaction Noted  .  Penicillins Palpitations 03/04/2011  . Prednisone  01/18/2015    Family History  Problem Relation Age of Onset  . Melanoma Father     deceased at 49  . Diabetes Mother   . Dementia Mother     deceased at 49  . Colon cancer Neg Hx     History   Social History  . Marital Status: Married    Spouse Name: N/A  . Number of Children: N/A  . Years of Education: N/A   Occupational History  . Not on file.   Social History Main Topics  . Smoking status: Never Smoker   . Smokeless tobacco: Never Used  . Alcohol Use: No  . Drug Use: No  . Sexual Activity:    Partners: Male    Birth Control/  Protection:      Comment: 1st intercourse 60 yo-1 partner   Other Topics Concern  . Not on file   Social History Narrative    Review of Systems: See HPI, otherwise negative ROS  Physical Exam: BP 127/78 mmHg  Pulse 98  Temp(Src) 97.6 F (36.4 C) (Oral)  Ht 5\' 8"  (1.727 m)  Wt 167 lb (75.751 kg)  BMI 25.40 kg/m2 General:   Alert,  Anxious appearing. But in no acute distress. Accompanied by husband. Skin:  Intact without significant lesions or rashes. Eyes:  Sclera clear, no icterus.   Conjunctiva pink. Ears:  Normal auditory acuity. Nose:  No deformity, discharge,  or lesions. Mouth:  No deformity or lesions. Neck:  Supple; no masses or thyromegaly. No significant cervical adenopathy. Lungs:  Clear throughout to auscultation.   No wheezes, crackles, or rhonchi. No acute distress. Heart:  Regular rate and rhythm; no murmurs, clicks, rubs,  or gallops. Abdomen: Non-distended, normal bowel sounds.  Soft and nontender without appreciable mass or hepatosplenomegaly.  Pulses:  Normal pulses noted. Extremities:  Without clubbing or edema.  Impression:  Pleasant 69 year old lady with recurrent bouts of diverticulitis (clinically) with treatment complicated by C Difficile in the past. Second CT documented episode in 6 years earlier this year.  multiple bouts clinically treated with antibiotics. I suspect she may have a significant functional overlying illness. Some degree of "background IBS" likely. Recurrent episodes present a clinical challenge  Our previous algorithm was to refer her to a surgeon if she required further courses of antibiotics. She saw Dr. Britta Mccreedy last week apparently had no other ideas.  Surgical referral is not out of the question at this time.  I would be concerned she could still have episodic abdominal pain crisis even if she had a segmental resection of her colon I think prior to sending her to a surgeon it would be prudent to get a second opinion by one of the  gastroenterologist over at [facility].  Recommendations:  Referral to [facility] to a gastroenterologist over there. She is to continue probiotic, Prevacid 30 mg daily. May use nighttime dose of Pepcid for breakthrough symptoms.  May use continue using Phenergan sparingly when necessary.  Plan to see her back in the office after she's been seen at [facility] and we have feedback for review.   Notice: This dictation was prepared with Dragon dictation along with smaller phrase technology. Any transcriptional errors that result from this process are unintentional and may not be corrected upon review.

## 2015-05-25 ENCOUNTER — Encounter (HOSPITAL_COMMUNITY)
Admission: RE | Admit: 2015-05-25 | Discharge: 2015-05-25 | Disposition: A | Discharge status: Home / Self Care | Payer: Medicare Other | Source: Ambulatory Visit | Attending: Internal Medicine | Admitting: Internal Medicine

## 2015-05-25 DIAGNOSIS — R911 Solitary pulmonary nodule: Secondary | ICD-10-CM | POA: Diagnosis present

## 2015-05-25 LAB — GLUCOSE, CAPILLARY: Glucose-Capillary: 99 mg/dL (ref 65–99)

## 2015-05-25 MED ORDER — FLUDEOXYGLUCOSE F - 18 (FDG) INJECTION
8.0800 | Freq: Once | INTRAVENOUS | Status: AC | PRN
  Administered 2015-05-25: 8.08 via INTRAVENOUS

## 2015-05-26 ENCOUNTER — Encounter: Payer: Self-pay | Admitting: Internal Medicine

## 2015-06-02 ENCOUNTER — Telehealth: Payer: Self-pay | Admitting: Internal Medicine

## 2015-06-02 NOTE — Telephone Encounter (Signed)
noted 

## 2015-06-02 NOTE — Telephone Encounter (Signed)
Pt seen RMR on 6/28 and called today wanting to make another ASAP OV with RMR. Her stomach and bowels are acting up and she wanted to be seen. I made her an OV with RMR for 7/19 at 2

## 2015-06-06 ENCOUNTER — Ambulatory Visit: Payer: Medicare Other | Admitting: Internal Medicine

## 2015-06-08 ENCOUNTER — Ambulatory Visit (INDEPENDENT_AMBULATORY_CARE_PROVIDER_SITE_OTHER): Payer: Medicare Other | Admitting: Gastroenterology

## 2015-06-08 ENCOUNTER — Encounter: Payer: Self-pay | Admitting: Gastroenterology

## 2015-06-08 VITALS — BP 141/81 | HR 81 | Temp 97.0°F | Ht 68.0 in | Wt 169.2 lb

## 2015-06-08 DIAGNOSIS — K5792 Diverticulitis of intestine, part unspecified, without perforation or abscess without bleeding: Secondary | ICD-10-CM | POA: Diagnosis not present

## 2015-06-08 NOTE — Patient Instructions (Signed)
Take Flagyl with food, 1/2 pill four times daily.  Complete Cipro.  For now you should avoid fiber supplements and eat low fiber diet.   Once you are off your antibiotics and feeling better, then you can add back fiber in your diet. You should then also add back a fiber supplement, either Metamucil, Benefiber, or FiberChoice once daily.   If you are not feeling better by the first of the week, please call.   I will get a copy of your records from [facility] for review.  I will discuss case with Dr. Gala Romney, you may need another colonoscopy once you are over this episode. Further recommendations to follow.   Diverticulitis Diverticulitis is inflammation or infection of small pouches in your colon that form when you have a condition called diverticulosis. The pouches in your colon are called diverticula. Your colon, or large intestine, is where water is absorbed and stool is formed. Complications of diverticulitis can include:  Bleeding.  Severe infection.  Severe pain.  Perforation of your colon.  Obstruction of your colon. CAUSES  Diverticulitis is caused by bacteria. Diverticulitis happens when stool becomes trapped in diverticula. This allows bacteria to grow in the diverticula, which can lead to inflammation and infection. RISK FACTORS People with diverticulosis are at risk for diverticulitis. Eating a diet that does not include enough fiber from fruits and vegetables may make diverticulitis more likely to develop. SYMPTOMS  Symptoms of diverticulitis may include:  Abdominal pain and tenderness. The pain is normally located on the left side of the abdomen, but may occur in other areas.  Fever and chills.  Bloating.  Cramping.  Nausea.  Vomiting.  Constipation.  Diarrhea.  Blood in your stool. DIAGNOSIS  Your health care provider will ask you about your medical history and do a physical exam. You may need to have tests done because many medical conditions can  cause the same symptoms as diverticulitis. Tests may include:  Blood tests.  Urine tests.  Imaging tests of the abdomen, including X-rays and CT scans. When your condition is under control, your health care provider may recommend that you have a colonoscopy. A colonoscopy can show how severe your diverticula are and whether something else is causing your symptoms. TREATMENT  Most cases of diverticulitis are mild and can be treated at home. Treatment may include:  Taking over-the-counter pain medicines.  Following a clear liquid diet.  Taking antibiotic medicines by mouth for 7-10 days. More severe cases may be treated at a hospital. Treatment may include:  Not eating or drinking.  Taking prescription pain medicine.  Receiving antibiotic medicines through an IV tube.  Receiving fluids and nutrition through an IV tube.  Surgery. HOME CARE INSTRUCTIONS   Follow your health care provider's instructions carefully.  Follow a full liquid diet or other diet as directed by your health care provider. After your symptoms improve, your health care provider may tell you to change your diet. He or she may recommend you eat a high-fiber diet. Fruits and vegetables are good sources of fiber. Fiber makes it easier to pass stool.  Take fiber supplements or probiotics as directed by your health care provider.  Only take medicines as directed by your health care provider.  Keep all your follow-up appointments. SEEK MEDICAL CARE IF:   Your pain does not improve.  You have a hard time eating food.  Your bowel movements do not return to normal. SEEK IMMEDIATE MEDICAL CARE IF:   Your pain becomes worse.  Your symptoms do not get better.  Your symptoms suddenly get worse.  You have a fever.  You have repeated vomiting.  You have bloody or black, tarry stools. MAKE SURE YOU:   Understand these instructions.  Will watch your condition.  Will get help right away if you are not  doing well or get worse. Document Released: 08/14/2005 Document Revised: 11/09/2013 Document Reviewed: 09/29/2013 [facility] Patient Information 2015 [place], [place]. This information is not intended to replace advice given to you by your health care provider. Make sure you discuss any questions you have with your health care provider.

## 2015-06-08 NOTE — Progress Notes (Signed)
cc'ed to pcp °

## 2015-06-08 NOTE — Assessment & Plan Note (Addendum)
Second documented episode of uncomplicated sigmoid diverticulitis since May of this year. Episode definitely with notable acute on chronic abdominal pain. Associated with nausea and bowel habit change. Stool culture with rare coag positive staph, final pending. Patient currently on Cipro and Flagyl for diverticulitis. Flagyl dose reduced due to nausea. Recommend taking at least 250 mg 4 times daily with food. Complete anabiotic therapy. Continue probiotics. Low residue diet and hold fiber supplement until symptoms back to baseline and she is completed anabiotic therapy.  Not mentioned previously, patient had a PET scan for evaluation of new pulmonary nodule. This was on July 7. She had hypermetabolic activity associated with the sigmoid colon. Possibly related to inflammation and at the time radiologist mention residual inflammation from prior diverticulitis dated back in May. Patient's last colonoscopy 2012. Cannot rule out possibility of need for additional colonoscopy once her diverticulitis is treated.  Discussed diverticulitis at length with the patient regarding treatment and diet. Handout provided.  We will follow-up stool culture final results is available, ordered by PCP however.  Further recommendations to follow. Patient is aware to call us if ongoing symptoms or worsening symptoms.

## 2015-06-08 NOTE — Progress Notes (Signed)
Primary Care Physician: Glenda Chroman., MD  Primary Gastroenterologist:  Garfield Cornea, MD   Chief Complaint  Patient presents with  . Follow-up    from hospital, diverticulitis. still having some pain and bloating.     HPI: Kaitlin May is a 69 y.o. female here for follow-up. She missed her appointment with Dr. Gala Romney 2 days ago because she was in the hospital. Seen here back on June 28 by Dr. Gala Romney. She has a history of abdominal pain, nausea, GERD, weight loss. Since that office visit she saw Dr. Arsenio Loader at [facility] on his 05/24/2015 at Dr. Roseanne Kaufman request. Please see his detailed note for complete details however essentially he felt she had diverticulosis, IBS with worse pain related to constipation. He felt that some of her pain related to L2-L3 left nerve root compression. He noted chronic anxiety. Did not feel like she had complicated diverticulitis. Advised her to get back on Metamucil and probiotic and follow-up locally.  Patient had documented diverticulitis on 03/31/2015 by CT. Subsequent follow-up CT on June 12 when she presented to the emergency department with ongoing symptoms showed interval resolution of sigmoid diverticulitis. Patient reports after this episode she did get to where she was feeling fairly well. She was able to eat was able to gain a few pounds. However last Thursday she woke up in the middle the night with abdominal bloating and urge to have a bowel movement. She felt constipated. Took MiraLAX around 5:30 AM and eventually started moving her bowels. Her abdominal pain progressed over 24 hours, developed subjective fever, nausea and unable to eat. She went to emergency department at [facility] on Saturday, 06/03/2015 for progressive symptoms. CT at that time showed lower sigmoid colon inflammatory changes involving presacral fat, wall thickening with margin ill-defined consistent with uncomplicated diverticulitis of the lower sigmoid colon.  Patient was started on Cipro and Flagyl. Discharged home on 06/06/2015.  Review of his records reveal stool culture which is incubating. Coag-positive staph growing, final results and sensitivities pending. She was not aware of these results and we were not aware until after we retrieved records post patient's visit. She has been having ongoing nausea and was advised to cut her Flagyl to half a dose (250 mg 3 times a day as opposed to 500 mg 3 times a day) by Dr. Woody Seller. This is seem to help her nausea. Tries to limit her pain pill to one to 2 per day. Has moved her bowels a couple times this week. Denies melena. Small amount of bright red blood per rectum possibly hemorrhoid. No other complaints.   Current Outpatient Prescriptions  Medication Sig Dispense Refill  . ANUCORT-HC 25 MG suppository PLACE 1 SUPPOSITORY RECTALLY 2 TIMES DAILY AS NEEDED FOR HEMORRHOIDS OR ITCHING. -PT. WANTS BRAND NAME ONLY -- DR DENIED REFILLS, PT NEEDS T 24 suppository 1  . ciprofloxacin (CIPRO) 500 MG tablet Take 1 tablet (500 mg total) by mouth 2 (two) times daily. 28 tablet 0  . lansoprazole (PREVACID) 30 MG capsule 30 mg daily.    Marland Kitchen LORazepam (ATIVAN) 1 MG tablet Take 1 mg by mouth daily as needed.      . metroNIDAZOLE (FLAGYL) 500 MG tablet Take 1 tablet (500 mg total) by mouth 3 (three) times daily. Take with food 42 tablet 0  . oxyCODONE (OXY IR/ROXICODONE) 5 MG immediate release tablet     . promethazine (PHENERGAN) 12.5 MG tablet Take 1 tablet (12.5 mg total) by mouth every 6 (  six) hours as needed for nausea or vomiting (alternate with zofran). 30 tablet 0  . promethazine (PHENERGAN) 25 MG suppository Place 25 mg rectally every 6 (six) hours as needed.      . saccharomyces boulardii (FLORASTOR) 250 MG capsule Take 1 capsule (250 mg total) by mouth 2 (two) times daily. 60 capsule 1   No current facility-administered medications for this visit.    Allergies as of 06/08/2015 - Review Complete 06/08/2015  Allergen  Reaction Noted  . Penicillins Palpitations 03/04/2011  . Prednisone  01/18/2015   Past Surgical History  Procedure Laterality Date  . Tonsillectomy    . Appendectomy    . Colonoscopy  03/25/11    Dr. Elwyn Reach sided diverticula, normal rectum, tubular adenoma. Due for next TCS 03/2018.   . Oophorectomy      LSO  . Abdominal hysterectomy      endometrial hyperplasia, irreg bleeding    ROS:  General: See history of present illness  ENT: Negative for hoarseness, difficulty swallowing , nasal congestion. CV: Negative for chest pain, angina, palpitations, dyspnea on exertion, peripheral edema.  Respiratory: Negative for dyspnea at rest, dyspnea on exertion, cough, sputum, wheezing.  GI: See history of present illness. GU:  Negative for dysuria, hematuria, urinary incontinence, urinary frequency, nocturnal urination.  Endo: Negative for unusual weight change.    Physical Examination:   BP 141/81 mmHg  Pulse 81  Temp(Src) 97 F (36.1 C) (Oral)  Ht 5\' 8"  (1.727 m)  Wt 169 lb 3.2 oz (76.749 kg)  BMI 25.73 kg/m2  General: Well-nourished, well-developed in no acute distress.  Eyes: No icterus. Mouth: Oropharyngeal mucosa moist and pink , no lesions erythema or exudate. Lungs: Clear to auscultation bilaterally.  Heart: Regular rate and rhythm, no murmurs rubs or gallops.  Abdomen: Bowel sounds are normal, mild left lower quadrant tenderness, nondistended, no hepatosplenomegaly or masses, no abdominal bruits or hernia , no rebound or guarding.   Extremities: No lower extremity edema. No clubbing or deformities. Neuro: Alert and oriented x 4   Skin: Warm and dry, no jaundice.   Psych: Alert and cooperative, normal mood and affect.  Labs:  Labs from 07/16 2016 White blood cell count 8400, hemoglobin 13.1, hematocrit 40.4, platelets 313,000, creatinine 0.73, total bilirubin 0.5, alkaline phosphatase 111, AST 11.6, ALT 8, albumen 4.1, stool culture with rare growth of coagulase-positive  staph, ID and sensitivities to follow. Campylobacter negative. Shigella, Salmonella negative. Imaging Studies: Nm Pet Image Initial (pi) Skull Base To Thigh  05/25/2015   CLINICAL DATA:  Initial treatment strategy for right upper lobe pulmonary nodule. Unexplained weight loss no history of malignancy.  EXAM: NUCLEAR MEDICINE PET SKULL BASE TO THIGH  TECHNIQUE: 8.08 mCi F-18 FDG was injected intravenously. Full-ring PET imaging was performed from the skull base to thigh after the radiotracer. CT data was obtained and used for attenuation correction and anatomic localization.  FASTING BLOOD GLUCOSE:  Value: 99 mg/dl  COMPARISON:  Chest CT 05/04/2015. Abdominal pelvic CT 04/30/2015 and 03/31/2015.  FINDINGS: NECK  No hypermetabolic cervical lymph nodes are identified.There are no lesions of the pharyngeal mucosal space. There is no abnormal thyroid activity.  CHEST  There are no hypermetabolic mediastinal, hilar or axillary lymph nodes. There is no abnormal metabolic activity within the subpleural right upper lobe 12 mm nodule which appears unchanged (image number 12). No abnormal metabolic activity seen elsewhere within the lungs.  ABDOMEN/PELVIS  There is no hypermetabolic activity within the liver, adrenal glands, spleen or pancreas.  There is no hypermetabolic nodal activity. There is prominent hypermetabolic activity within the left pelvis associated with the sigmoid colon. This has an SUV max of 7.0 and corresponds with ill-defined nodularity projecting beyond the lumen of the sigmoid colon. There are diffuse sigmoid colon diverticular changes, and this activity is likely inflammatory. No other abnormal bowel activity identified.  SKELETON  There is no hypermetabolic activity to suggest osseous metastatic disease. 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 There is a convex left thoracolumbar scoliosis. There is prominent brown fat activity within the  paraspinal musculature in the lower neck and chest.  IMPRESSION: 1. The subpleural right upper lobe nodular density demonstrates no abnormal metabolic activity. This supports postinflammatory scarring as the etiology. CT follow-up in 6 months recommended to exclude low grade neoplasm. 2. Hypermetabolic activity associated with the sigmoid colon. This likely represents residual inflammation given the changes of diverticulitis seen on the CT performed less than 2 months ago.   Electronically Signed   By: Richardean Sale M.D.   On: 05/25/2015 10:31

## 2015-06-13 ENCOUNTER — Other Ambulatory Visit: Payer: Self-pay

## 2015-06-13 ENCOUNTER — Telehealth: Payer: Self-pay

## 2015-06-13 MED ORDER — METRONIDAZOLE 500 MG PO TABS: 250.0000 mg | ORAL_TABLET | Freq: Four times a day (QID) | ORAL | Status: DC

## 2015-06-13 MED ORDER — ONDANSETRON HCL 4 MG PO TABS: 4.0000 mg | ORAL_TABLET | ORAL | Status: DC | PRN

## 2015-06-13 MED ORDER — CIPROFLOXACIN HCL 500 MG PO TABS: 500.0000 mg | ORAL_TABLET | Freq: Two times a day (BID) | ORAL | Status: DC

## 2015-06-13 NOTE — Telephone Encounter (Signed)
Pt called and states that she seen [facility] on Thursday last week and that she was instructed to call back to let us know how she feeling.   States she is still having some abd pain. Not like before. States she is feeling nauseated and that she took her last Cirpo this am.  States she took a Phenergan also but it hasn't helped.  Please advise. Call pt at home or on Cell [phone]

## 2015-06-13 NOTE — Telephone Encounter (Signed)
Spoke with pt and she is aware of recommendations. Called in Flagyl to pharmacy. Pt has OV on 06/28/2015 @ 11:30

## 2015-06-13 NOTE — Telephone Encounter (Signed)
Please let patient know that I reviewed stool culture results from [facility]. Stool culture showed rare growth of coag positive staph, sensitive to Cipro which she was already on for diverticulitis.   Let's continue 7 more days of Cipro and Flagyl since she has ongoing pain and well-documented diverticulitis on recent CT.  Please send in Flagyl 250mg  QID for 7 days with food (patient may have enough at home, please check with her).  Cipro 500mg  BID for 7 days RX sent to pharmacy.  zofran 4mg  po every 4 hours prn RX sent to pharmacy.  OV in 2 weeks for follow up.

## 2015-06-28 ENCOUNTER — Encounter: Payer: Self-pay | Admitting: Gastroenterology

## 2015-06-28 ENCOUNTER — Ambulatory Visit (INDEPENDENT_AMBULATORY_CARE_PROVIDER_SITE_OTHER): Payer: Medicare Other | Admitting: Gastroenterology

## 2015-06-28 VITALS — BP 120/79 | HR 101 | Temp 97.3°F | Ht 68.0 in | Wt 161.6 lb

## 2015-06-28 DIAGNOSIS — K219 Gastro-esophageal reflux disease without esophagitis: Secondary | ICD-10-CM

## 2015-06-28 DIAGNOSIS — K5730 Diverticulosis of large intestine without perforation or abscess without bleeding: Secondary | ICD-10-CM | POA: Diagnosis not present

## 2015-06-28 DIAGNOSIS — R1314 Dysphagia, pharyngoesophageal phase: Secondary | ICD-10-CM

## 2015-06-28 DIAGNOSIS — R634 Abnormal weight loss: Secondary | ICD-10-CM

## 2015-06-28 DIAGNOSIS — R1010 Upper abdominal pain, unspecified: Secondary | ICD-10-CM

## 2015-06-28 DIAGNOSIS — K5909 Other constipation: Secondary | ICD-10-CM

## 2015-06-28 DIAGNOSIS — K5900 Constipation, unspecified: Secondary | ICD-10-CM

## 2015-06-28 DIAGNOSIS — R110 Nausea: Secondary | ICD-10-CM | POA: Insufficient documentation

## 2015-06-28 DIAGNOSIS — R1319 Other dysphagia: Secondary | ICD-10-CM

## 2015-06-28 DIAGNOSIS — R1310 Dysphagia, unspecified: Secondary | ICD-10-CM | POA: Insufficient documentation

## 2015-06-28 DIAGNOSIS — R1030 Lower abdominal pain, unspecified: Secondary | ICD-10-CM

## 2015-06-28 LAB — TSH: TSH: 0.637 u[IU]/mL (ref 0.350–4.500)

## 2015-06-28 MED ORDER — LINACLOTIDE 145 MCG PO CAPS: 145.0000 ug | ORAL_CAPSULE | Freq: Every day | ORAL | Status: AC

## 2015-06-28 NOTE — Progress Notes (Signed)
CC'ED TO PCP 

## 2015-06-28 NOTE — Progress Notes (Signed)
Primary Care Physician: Glenda Chroman., MD  Primary Gastroenterologist:  Garfield Cornea, MD   Chief Complaint  Patient presents with  . Follow-up    not feeling good/ constipation    HPI: Kaitlin May is a 69 y.o. female here for follow-up. She is last seen on 06/08/2015 after hospitalization for CT documented diverticulitis. History of chronic abdominal pain, nausea, GERD, weight loss.Since that office visit she saw Dr. Arsenio Loader at [facility] on his 05/24/2015 at Dr. Roseanne Kaufman request. Please see his detailed note for complete details however essentially he felt she had diverticulosis, IBS with worse pain related to constipation. He felt that some of her pain related to L2-L3 left nerve root compression. He noted chronic anxiety. Did not feel like she had complicated diverticulitis. Advised her to get back on Metamucil and probiotic and follow-up locally.  03/31/2015 she had documented diverticulitis on CT. Subsequent follow-up CT on June 12 showed interval resolution of sigmoid diverticulitis. 06/03/2015 she was hospitalized for diverticulitis involving the lower sigmoid colon which appeared uncomplicated. Treated with Cipro and Flagyl although her Flagyl dose was decreased because intolerability/nausea. Also had a PET scan to evaluate new pulmonary nodule back on July 7 noted to have hypermetabolic activity associated with the sigmoid colon. Possibly related diverticulitis per radiologist. Patient's last colonoscopy was in 2012.  Patient called in to presented July with ongoing complaints. I reviewed recommended extending her Flagyl and Cipro for 7 more days although she tells me that she followed up with her primary care he stopped the Flagyl because of the nausea. Switched her Cipro to Levaquin. It is noted that her stool culture during her hospitalization grew rare coag positive staph which was sensitive to both Cipro and Levaquin.   Overall patient admits that her left lower quadrant  pain is much improved. She continues to have diffuse vague lower and upper abdominal discomfort. Worse with meals.Trying to limit pain medications due to constipation. Using when necessary Miralax. Used several gylcerin suppositories. Stool softener. Dr. Woody Seller gave her samples of Linzess 235mcg, took one Monday no results. Enema Tuesday morning with only return of enema. Few small bowel movements later. Took second Howard yesterday. Too strong for her. Multiple stools during the night. Denies any fever. Has been passing some blood per rectum. Complains of postprandial upper abdominal pain associate with swelling, nausea. No heartburn. Complains of esophageal dysphagia to pills.   Last dose of antibiotic on 06/20/2015.  No prior EGD. Niece has celiac disease.   Ongoing weight loss. Patient reports 40 pounds total.  04/2012 208 lb 12/2014 186 lb 01/2015 179 lb 03/2015 169 lb 05/2015 169 lb 8//2016 161 lb   Current Outpatient Prescriptions  Medication Sig Dispense Refill  . butalbital-acetaminophen-caffeine (FIORICET, ESGIC) 50-325-40 MG per tablet Take by mouth 2 (two) times daily as needed for headache. ONLY AS NEEDED FOR MIGRAINES    . lansoprazole (PREVACID) 30 MG capsule 30 mg daily.    Marland Kitchen LORazepam (ATIVAN) 1 MG tablet Take 1 mg by mouth 2 (two) times daily as needed.     . promethazine (PHENERGAN) 12.5 MG tablet Take 1 tablet (12.5 mg total) by mouth every 6 (six) hours as needed for nausea or vomiting (alternate with zofran). 30 tablet 0  . saccharomyces boulardii (FLORASTOR) 250 MG capsule Take 1 capsule (250 mg total) by mouth 2 (two) times daily. 60 capsule 1  . oxyCODONE (OXY IR/ROXICODONE) 5 MG immediate release tablet Take 5 mg by mouth every 4 (four) hours  as needed.      No current facility-administered medications for this visit.    Allergies as of 06/28/2015 - Review Complete 06/28/2015  Allergen Reaction Noted  . Penicillins Palpitations 03/04/2011  . Prednisone  01/18/2015    Past Medical History  Diagnosis Date  . Acoustic neuroma     in situ  . HTN (hypertension)   . Migraines     uses demerol and phenergan for migraines due to severity of pain, nausea  . Seasonal allergies   . GERD (gastroesophageal reflux disease)   . 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 Gala Romney  . Diverticula of colon   . Diverticulitis      Negative CTs in 2004, 2009, 2011, 2013. Positive for sigmoid diverticulitis in 03/2009  . C. difficile colitis   . Rectal fistula    Past Surgical History  Procedure Laterality Date  . Tonsillectomy    . Appendectomy    . Colonoscopy  03/25/11    Dr. Elwyn Reach sided diverticula, normal rectum, tubular adenoma. Due for next TCS 03/2018.   . Oophorectomy      LSO  . Abdominal hysterectomy      endometrial hyperplasia, irreg bleeding   Family History  Problem Relation Age of Onset  . Melanoma Father     deceased at 42  . Diabetes Mother   . Dementia Mother     deceased at 28  . Colon cancer Neg Hx    Social History   Social History  . Marital Status: Married    Spouse Name: N/A  . Number of Children: N/A  . Years of Education: N/A   Social History Main Topics  . Smoking status: Never Smoker   . Smokeless tobacco: Never Used     Comment: Never smoked  . Alcohol Use: No  . Drug Use: No  . Sexual Activity:    Partners: Male    Birth Control/ Protection:      Comment: 1st intercourse 86 yo-1 partner   Other Topics Concern  . None   Social History Narrative    ROS:  General: see hpi. Complains of fatigue, weakness. ENT: Negative for hoarseness,  nasal congestion. See hpi. CV: Negative for chest pain, angina, palpitations, dyspnea on exertion, peripheral edema.  Respiratory: Negative for dyspnea at rest, dyspnea on exertion, cough, sputum, wheezing.  GI: See history of present illness. GU:  Negative for dysuria, hematuria, urinary incontinence, urinary frequency, nocturnal urination.  Endo: see hpi.   Physical  Examination:   BP 120/79 mmHg  Pulse 101  Temp(Src) 97.3 F (36.3 C) (Oral)  Ht 5\' 8"  (1.727 m)  Wt 161 lb 9.6 oz (73.301 kg)  BMI 24.58 kg/m2  General: Well-nourished, well-developed in no acute distress. Accompanied by spouse.  Eyes: No icterus. Mouth: Oropharyngeal mucosa moist and pink , no lesions erythema or exudate. Lungs: Clear to auscultation bilaterally.  Heart: Regular rate and rhythm, no murmurs rubs or gallops.  Abdomen: Bowel sounds are normal, mild diffuse tenderness, nondistended, no hepatosplenomegaly or masses, no abdominal bruits or hernia , no rebound or guarding.   Extremities: No lower extremity edema. No clubbing or deformities. Neuro: Alert and oriented x 4   Skin: Warm and dry, no jaundice.   Psych: Alert and cooperative. Anxious.    Imaging Studies: No results found.   Impression/Plan: 68 year old female with ongoing abdominal pain, anorexia, nausea, weight loss. Complicated GI history as previously outlined. Has been treated with multiple rounds antibiotics for  diverticulitis and C. difficile for the past 10-12 months. Recent CT documentation of uncomplicated diverticulitis in May as well as July. PET scan positive in the sigmoid region as outlined. Could be related to inflammation from diverticulitis per radiologist. Patient's last colonoscopy was in 2012. She has been evaluated by Dr. Britta Mccreedy at [facility] during one of her hospitalizations as well as Dr. Arsenio Loader over [facility]. Suspected significant functional overlay as well. At the time there was not adequate documented diverticulitis to suggest surgical intervention.   Will discuss further with Dr. Gala Romney. Likely offer the patient colonoscopy and upper endoscopy plus or minus esophageal dilation for postprandial nausea, upper abdominal pain, esophageal dysphagia to pills, unexplained weight loss, evaluate abnormality on PET scan. Other option would be  repeat imaging to see if her diverticulitis has resolved although she has had multiple studies as previously outlined. Further recommendations to follow.  Patient requested I check her for celiac disease, not an unreasonable request therefore serologies were ordered. Check thyroid status as well. Continue Linzess for constipation but decrease dose to 176mcg daily on empty stomach.

## 2015-06-28 NOTE — Patient Instructions (Signed)
Please have your labs done.   Linzess 114mcg daily on empty stomach for constipation. Samples provided. RX sent to your pharmacy.  I will discuss next step in your care with Dr. Gala Romney and provide you with further input within the next 24 hours.

## 2015-06-29 ENCOUNTER — Telehealth: Payer: Self-pay | Admitting: Internal Medicine

## 2015-06-29 ENCOUNTER — Other Ambulatory Visit: Payer: Self-pay

## 2015-06-29 LAB — TISSUE TRANSGLUTAMINASE, IGA: TISSUE TRANSGLUTAMINASE AB, IGA: 1 U/mL (ref <4)

## 2015-06-29 LAB — IGA: IgA: 91 mg/dL (ref 69–380)

## 2015-06-29 MED ORDER — PEG 3350-KCL-NA BICARB-NACL 420 G PO SOLR: 4000.0000 mL | Freq: Once | ORAL | Status: DC

## 2015-06-29 NOTE — Telephone Encounter (Signed)
Discussed with Dr. Gala Romney. Discussed with the patient. She knows to call if she feels like things are changing or getting worse.   He recommends TCS/EGD +/- ED with deep sedation in the OR (polypharmacy). Dx: abnormal colon on PET/CT, unintentional weight loss, epigastric pain, nausea, pill dysphagia.   HE WANTS TO DO IT IN 2 WEEKS.  2 FULL DAYS OF CLEAR LIQUIDS. GIVE HER SPLIT PREP. CONTINUE LINZESS 145MCG DAILY. CALL IF CONSTIPATION.

## 2015-06-29 NOTE — Telephone Encounter (Signed)
Patient called this morning saying that she was suppose to have gotten a call from [facility] and hasn't heard from [place]. Patient was seen yesterday. Patient states she is going "downhill" and isn't getting any better. She asked to speak with [facility] and I told her that we had several nurses out today and we still had patients being seen and I would have to take a message and someone would call her back when providers and/or nurses were available after seeing the morning patients. Patient got anxious about when someone would call her back and I assured her someone would call her, but at the moment they couldn't break away from seeing the morning patients. Please call her at [phone]

## 2015-06-29 NOTE — Telephone Encounter (Signed)
Routing to [facility] whom saw pt on 06/28/2015

## 2015-06-29 NOTE — Telephone Encounter (Signed)
Pt is aware.   Pt is set for 07/13/2015 @ 12:15pm.  Mailed instructions and all appointments to pt.

## 2015-06-30 ENCOUNTER — Telehealth: Payer: Self-pay | Admitting: Internal Medicine

## 2015-06-30 NOTE — Telephone Encounter (Signed)
Pt is aware and will proceed to the ED if she worsens.

## 2015-06-30 NOTE — Progress Notes (Signed)
Quick Note:  Patient is aware. ______

## 2015-06-30 NOTE — Telephone Encounter (Signed)
Please call patient, having abdominal pain and is shaky.  Very very sick.  [phone]

## 2015-06-30 NOTE — Telephone Encounter (Signed)
I spoke with the pt- she said she started having lower abd pain last night and couldn't sleep. She was sweaty and shaky "inside and outside". She has had nausea. She drank a carnation instant bfast this morning and became nauseated. She went to there pcp this morning and he gave her a "pain shot and a nausea shot". The linzess caused diarrhea. No fever, no blood in her stool.   She is not sure what she needs to do

## 2015-06-30 NOTE — Telephone Encounter (Signed)
Patient evaluated by PCP for acute findings. She can hold Linzess while having diarrhea. Would continue with plan for EGD/TCS as outlined.  If symptoms worsen, she should go to ER, ie cannot keep fluids down, fever, worsening abdominal pain.

## 2015-07-07 NOTE — Patient Instructions (Addendum)
ORALEE RAPAPORT  07/07/2015     @PREFPERIOPPHARMACY @   Your procedure is scheduled on 07/13/2015  Report to [facility] at 11:45 A.M.  Call this number if you have problems the morning of surgery:  [phone]   Remember:  Do not eat food or drink liquids after midnight.  Take these medicines the morning of surgery with A SIP OF WATER Prevacid, Linzess, Ativan, Oxycodone  Do not wear jewelry, make-up or nail polish.  Do not wear lotions, powders, or perfumes.  You may wear deodorant.  Do not shave 48 hours prior to surgery.  Men may shave face and neck.  Do not bring valuables to the hospital.  [facility] is not responsible for any belongings or valuables.  Contacts, dentures or bridgework may not be worn into surgery.  Leave your suitcase in the car.  After surgery it may be brought to your room.  For patients admitted to the hospital, discharge time will be determined by your treatment team.  Patients discharged the day of surgery will not be allowed to drive home.     Please read over the following fact sheets that you were given. Anesthesia Post-op Instructions      PATIENT INSTRUCTIONS POST-ANESTHESIA  IMMEDIATELY FOLLOWING SURGERY:  Do not drive or operate machinery for the first twenty four hours after surgery.  Do not make any important decisions for twenty four hours after surgery or while taking narcotic pain medications or sedatives.  If you develop intractable nausea and vomiting or a severe headache please notify your doctor immediately.  FOLLOW-UP:  Please make an appointment with your surgeon as instructed. You do not need to follow up with anesthesia unless specifically instructed to do so.  WOUND CARE INSTRUCTIONS (if applicable):  Keep a dry clean dressing on the anesthesia/puncture wound site if there is drainage.  Once the wound has quit draining you may leave it open to air.  Generally you should leave the bandage intact for twenty four hours unless  there is drainage.  If the epidural site drains for more than 36-48 hours please call the anesthesia department.  QUESTIONS?:  Please feel free to call your physician or the hospital operator if you have any questions, and they will be happy to assist you.      Esophagogastroduodenoscopy Esophagogastroduodenoscopy (EGD) is a procedure to examine the lining of the esophagus, stomach, and first part of the small intestine (duodenum). A long, flexible, lighted tube with a camera attached (endoscope) is inserted down the throat to view these organs. This procedure is done to detect problems or abnormalities, such as inflammation, bleeding, ulcers, or growths, in order to treat them. The procedure lasts about 5-20 minutes. It is usually an outpatient procedure, but it may need to be performed in emergency cases in the hospital. LET YOUR CAREGIVER KNOW ABOUT:   Allergies to food or medicine.  All medicines you are taking, including vitamins, herbs, eyedrops, and over-the-counter medicines and creams.  Use of steroids (by mouth or creams).  Previous problems you or members of your family have had with the use of anesthetics.  Any blood disorders you have.  Previous surgeries you have had.  Other health problems you have.  Possibility of pregnancy, if this applies. RISKS AND COMPLICATIONS  Generally, EGD is a safe procedure. However, as with any procedure, complications can occur. Possible complications include:  Infection.  Bleeding.  Tearing (perforation) of the esophagus, stomach, or duodenum.  Difficulty breathing or not being  able to breath.  Excessive sweating.  Spasms of the larynx.  Slowed heartbeat.  Low blood pressure. BEFORE THE PROCEDURE  Do not eat or drink anything for 6-8 hours before the procedure or as directed by your caregiver.  Ask your caregiver about changing or stopping your regular medicines.  If you wear dentures, be prepared to remove them before the  procedure.  Arrange for someone to drive you home after the procedure. PROCEDURE   A vein will be accessed to give medicines and fluids. A medicine to relax you (sedative) and a pain reliever will be given through that access into the vein.  A numbing medicine (local anesthetic) may be sprayed on your throat for comfort and to stop you from gagging or coughing.  A mouth guard may be placed in your mouth to protect your teeth and to keep you from biting on the endoscope.  You will be asked to lie on your left side.  The endoscope is inserted down your throat and into the esophagus, stomach, and duodenum.  Air is put through the endoscope to allow your caregiver to view the lining of your esophagus clearly.  The esophagus, stomach, and duodenum is then examined. During the exam, your caregiver may:  Remove tissue to be examined under a microscope (biopsy) for inflammation, infection, or other medical problems.  Remove growths.  Remove objects (foreign bodies) that are stuck.  Treat any bleeding with medicines or other devices that stop tissues from bleeding (hot cautery, clipping devices).  Widen (dilate) or stretch narrowed areas of the esophagus and stomach.  The endoscope will then be withdrawn. AFTER THE PROCEDURE  You will be taken to a recovery area to be monitored. You will be able to go home once you are stable and alert.  Do not eat or drink anything until the local anesthetic and numbing medicines have worn off. You may choke.  It is normal to feel bloated, have pain with swallowing, or have a sore throat for a short time. This will wear off.  Your caregiver should be able to discuss his or her findings with you. It will take longer to discuss the test results if any biopsies were taken. Document Released: 03/07/2005 Document Revised: 03/21/2014 Document Reviewed: 10/07/2012 [facility] Patient Information 2015 [place], [place]. This information is not intended to replace  advice given to you by your health care provider. Make sure you discuss any questions you have with your health care provider. Esophageal Dilatation The esophagus is the long, narrow tube which carries food and liquid from the mouth to the stomach. Esophageal dilatation is the technique used to stretch a blocked or narrowed portion of the esophagus. This procedure is used when a part of the esophagus has become so narrow that it becomes difficult, painful or even impossible to swallow. This is generally an uncomplicated form of treatment. When this is not successful, chest surgery may be required. This is a much more extensive form of treatment with a longer recovery time. CAUSES  Some of the more common causes of blockage or strictures of the esophagus are:  Narrowing from longstanding inflammation (soreness and redness) of the lower esophagus. This comes from the constant exposure of the lower esophagus to the acid which bubbles up from the stomach. Over time this causes scarring and narrowing of the lower esophagus.  Hiatal hernia in which a small part of the stomach bulges (herniates) up through the diaphragm. This can cause a gradual narrowing of the end of  the esophagus.  Schatzki ring is a narrow ring of benign (non-cancerous) fibrous tissue which constricts the lower esophagus. The reason for this is not known.  Scleroderma is a connective tissue disorder that affects the esophagus and makes swallowing difficult.  Achalasia is an absence of nerves to the lower esophagus and to the esophageal sphincter. This is the circular muscle between the stomach and esophagus that relaxes to allow food into the stomach. After swallowing, it contracts to keep food in the stomach. This absence of nerves may be congenital (present since birth). This can cause irregular spasms of the lower esophageal muscle. This spasm does not open up to allow food and fluid through. The result is a persistent blockage with  subsequent slow trickling of the esophageal contents into the stomach.  Strictures may develop from swallowing materials which damage the esophagus. Some examples are strong acids or alkalis such as lye.  Growths such as benign (non-cancerous) and malignant (cancerous) tumors can block the esophagus.  Hereditary (present since birth) causes. DIAGNOSIS  Your caregiver often suspects this problem by taking a medical history. They will also do a physical exam. They can then prove their suspicions using X-rays and endoscopy. Endoscopy is an exam in which a tube like a small, flexible telescope is used to look at your esophagus.  TREATMENT There are different stretching (dilating) techniques that can be used. Simple bougie dilatation may be done in the office. This usually takes only a couple minutes. A numbing (anesthetic) spray of the throat is used. Endoscopy, when done, is done in an endoscopy suite under mild sedation. When fluoroscopy is used, the procedure is performed in X-ray. Other techniques require a little longer time. Recovery is usually quick. There is no waiting time to begin eating and drinking to test success of the treatment. Following are some of the methods used. Narrowing of the esophagus is treated by making it bigger. Commonly this is a mechanical problem which can be treated with stretching. This can be done in different ways. Your caregiver will discuss these with you. Some of the means used are:  A series of graduated (increasing thickness) flexible dilators can be used. These are weighted tubes passed through the esophagus into the stomach. The tubes used become progressively larger until the desired stretched size is reached. Graduated dilators are a simple and quick way of opening the esophagus. No visualization is required.  Another method is the use of endoscopy to place a flexible wire across the stricture. The endoscope is removed and the wire left in place. A dilator  with a hole through it from end to end is guided down the esophagus and across the stricture. One or more of these dilators are passed over the wire. At the end of the exam, the wire is removed. This type of treatment may be performed in the X-ray department under fluoroscopy. An advantage of this procedure is the examiner is visualizing the end opening in the esophagus.  Stretching of the esophagus may be done using balloons. Deflated balloons are placed through the endoscope and across the stricture. This type of balloon dilatation is often done at the time of endoscopy or fluoroscopy. Flexible endoscopy allows the examiner to directly view the stricture. A balloon is inserted in the deflated form into the area of narrowing. It is then inflated with air to a certain pressure that is preset for a given circumference. When inflated, it becomes sausage shaped, stretched, and makes the stricture larger.  Achalasia  requires a longer, larger balloon-type dilator. This is frequently done under X-ray control. In this situation, the spastic muscle fibers in the lower esophagus are stretched. All of the above procedures make the passage of food and water into the stomach easier. They also make it easier for stomach contents to reflux back into the esophagus. Special medications may be used following the procedure to help prevent further stricturing. Proton-pump inhibitor medications are good at decreasing the amount of acid in the stomach juice. When stomach juice refluxes into the esophagus, the juice is no longer as acidic and is less likely to burn or scar the esophagus. RISKS AND COMPLICATIONS Esophageal dilatation is usually performed effectively and without problems. Some complications that can occur are:  A small amount of bleeding almost always happens where the stretching takes place. If this is too excessive it may require more aggressive treatment.  An uncommon complication is perforation (making a  hole) of the esophagus. The esophagus is thin. It is easy to make a hole in it. If this happens, an operation may be necessary to repair this.  A small, undetected perforation could lead to an infection in the chest. This can be very serious. HOME CARE INSTRUCTIONS   If you received sedation for your procedure, do not drive, make important decisions, or perform any activities requiring your full coordination. Do not drink alcohol, take sedatives, or use any mind altering chemicals unless instructed by your caregiver.  You may use throat lozenges or warm salt water gargles if you have throat discomfort.  You can begin eating and drinking normally on return home unless instructed otherwise. Do not purposely try to force large chunks of food down to test the benefits of your procedure.  Mild discomfort can be eased with sips of ice water.  Medications for discomfort may or may not be needed. SEEK IMMEDIATE MEDICAL CARE IF:   You begin vomiting up blood.  You develop black, tarry stools.  You develop chills or an unexplained temperature of over 101F (38.3C)  You develop chest or abdominal pain.  You develop shortness of breath, or feel light-headed or faint.  Your swallowing is becoming more painful, difficult, or you are unable to swallow. MAKE SURE YOU:   Understand these instructions.  Will watch your condition.  Will get help right away if you are not doing well or get worse. Document Released: 12/26/2005 Document Revised: 03/21/2014 Document Reviewed: 02/12/2006 [facility] Patient Information 2015 [place], [place]. This information is not intended to replace advice given to you by your health care provider. Make sure you discuss any questions you have with your health care provider. Colonoscopy A colonoscopy is an exam to look at the entire large intestine (colon). This exam can help find problems such as tumors, polyps, inflammation, and areas of bleeding. The exam takes about 1  hour.  LET [facility] CARE PROVIDER KNOW ABOUT:   Any allergies you have.  All medicines you are taking, including vitamins, herbs, eye drops, creams, and over-the-counter medicines.  Previous problems you or members of your family have had with the use of anesthetics.  Any blood disorders you have.  Previous surgeries you have had.  Medical conditions you have. RISKS AND COMPLICATIONS  Generally, this is a safe procedure. However, as with any procedure, complications can occur. Possible complications include:  Bleeding.  Tearing or rupture of the colon wall.  Reaction to medicines given during the exam.  Infection (rare). BEFORE THE PROCEDURE   Ask your health  care provider about changing or stopping your regular medicines.  You may be prescribed an oral bowel prep. This involves drinking a large amount of medicated liquid, starting the day before your procedure. The liquid will cause you to have multiple loose stools until your stool is almost clear or light green. This cleans out your colon in preparation for the procedure.  Do not eat or drink anything else once you have started the bowel prep, unless your health care provider tells you it is safe to do so.  Arrange for someone to drive you home after the procedure. PROCEDURE   You will be given medicine to help you relax (sedative).  You will lie on your side with your knees bent.  A long, flexible tube with a light and camera on the end (colonoscope) will be inserted through the rectum and into the colon. The camera sends video back to a computer screen as it moves through the colon. The colonoscope also releases carbon dioxide gas to inflate the colon. This helps your health care provider see the area better.  During the exam, your health care provider may take a small tissue sample (biopsy) to be examined under a microscope if any abnormalities are found.  The exam is finished when the entire colon has been  viewed. AFTER THE PROCEDURE   Do not drive for 24 hours after the exam.  You may have a small amount of blood in your stool.  You may pass moderate amounts of gas and have mild abdominal cramping or bloating. This is caused by the gas used to inflate your colon during the exam.  Ask when your test results will be ready and how you will get your results. Make sure you get your test results. Document Released: 11/01/2000 Document Revised: 08/25/2013 Document Reviewed: 07/12/2013 [facility] Patient Information 2015 [place], [place]. This information is not intended to replace advice given to you by your health care provider. Make sure you discuss any questions you have with your health care provider.

## 2015-07-10 ENCOUNTER — Encounter (HOSPITAL_COMMUNITY): Payer: Self-pay

## 2015-07-10 ENCOUNTER — Encounter (HOSPITAL_COMMUNITY)
Admission: RE | Admit: 2015-07-10 | Discharge: 2015-07-10 | Disposition: A | Discharge status: Home / Self Care | Payer: Medicare Other | Source: Ambulatory Visit | Attending: Internal Medicine | Admitting: Internal Medicine

## 2015-07-10 DIAGNOSIS — Z8719 Personal history of other diseases of the digestive system: Secondary | ICD-10-CM | POA: Diagnosis not present

## 2015-07-10 DIAGNOSIS — R110 Nausea: Secondary | ICD-10-CM | POA: Diagnosis not present

## 2015-07-10 DIAGNOSIS — D123 Benign neoplasm of transverse colon: Secondary | ICD-10-CM | POA: Diagnosis not present

## 2015-07-10 DIAGNOSIS — R933 Abnormal findings on diagnostic imaging of other parts of digestive tract: Secondary | ICD-10-CM | POA: Diagnosis not present

## 2015-07-10 DIAGNOSIS — K625 Hemorrhage of anus and rectum: Secondary | ICD-10-CM | POA: Diagnosis not present

## 2015-07-10 DIAGNOSIS — R1310 Dysphagia, unspecified: Secondary | ICD-10-CM | POA: Diagnosis present

## 2015-07-10 DIAGNOSIS — K317 Polyp of stomach and duodenum: Secondary | ICD-10-CM | POA: Diagnosis not present

## 2015-07-10 DIAGNOSIS — K219 Gastro-esophageal reflux disease without esophagitis: Secondary | ICD-10-CM | POA: Diagnosis not present

## 2015-07-10 DIAGNOSIS — Z79899 Other long term (current) drug therapy: Secondary | ICD-10-CM | POA: Diagnosis not present

## 2015-07-10 DIAGNOSIS — I10 Essential (primary) hypertension: Secondary | ICD-10-CM | POA: Diagnosis not present

## 2015-07-10 DIAGNOSIS — R1032 Left lower quadrant pain: Secondary | ICD-10-CM | POA: Diagnosis not present

## 2015-07-10 DIAGNOSIS — K222 Esophageal obstruction: Secondary | ICD-10-CM | POA: Diagnosis not present

## 2015-07-10 DIAGNOSIS — R1010 Upper abdominal pain, unspecified: Secondary | ICD-10-CM | POA: Diagnosis not present

## 2015-07-10 DIAGNOSIS — K5730 Diverticulosis of large intestine without perforation or abscess without bleeding: Secondary | ICD-10-CM | POA: Diagnosis not present

## 2015-07-10 LAB — CBC
HEMATOCRIT: 39.5 % (ref 36.0–46.0)
HEMOGLOBIN: 13.1 g/dL (ref 12.0–15.0)
MCH: 29.7 pg (ref 26.0–34.0)
MCHC: 33.2 g/dL (ref 30.0–36.0)
MCV: 89.6 fL (ref 78.0–100.0)
Platelets: 221 10*3/uL (ref 150–400)
RBC: 4.41 MIL/uL (ref 3.87–5.11)
RDW: 12.7 % (ref 11.5–15.5)
WBC: 5.2 10*3/uL (ref 4.0–10.5)

## 2015-07-10 LAB — BASIC METABOLIC PANEL
ANION GAP: 7 (ref 5–15)
BUN: 9 mg/dL (ref 6–20)
CHLORIDE: 105 mmol/L (ref 101–111)
CO2: 27 mmol/L (ref 22–32)
Calcium: 9.3 mg/dL (ref 8.9–10.3)
Creatinine, Ser: 0.77 mg/dL (ref 0.44–1.00)
Glucose, Bld: 100 mg/dL — ABNORMAL HIGH (ref 65–99)
POTASSIUM: 4.8 mmol/L (ref 3.5–5.1)
SODIUM: 139 mmol/L (ref 135–145)

## 2015-07-11 ENCOUNTER — Telehealth: Payer: Self-pay | Admitting: General Practice

## 2015-07-11 NOTE — Telephone Encounter (Signed)
Pt called office back and states that she has been doing the clear liquids all day and is unsure if her body can take this not eating.  Staff informed pt to drink a soda and to push the fluids. Also told pt to eat Jello and broth as much as she could to keep hydrated.   Pt states that she feels as if her body is going to shut down. States that she may need to go to the ER for fluids because she may be getting dehydrated.  Again staff told pt to force fluids (jello, drinks, water, broth, etc.)  Pt asked if she could eat crackers and staff informed pt not to eat anything because that would jeopardize colonoscopy prep.

## 2015-07-11 NOTE — Telephone Encounter (Signed)
Opened in error

## 2015-07-11 NOTE — Telephone Encounter (Signed)
Kaitlin May is prepping for a colonoscopy and she wanted to know if Magda Paganini wanted her to take Florastor while she's prepping.  Patient would really like Magda Paganini to give her a call.  Verified with Almyra Free the patient should take her medications as directed and the patient should make sure she stays on a clear liquid diet for two days.

## 2015-07-11 NOTE — Telephone Encounter (Signed)
agree

## 2015-07-12 ENCOUNTER — Telehealth: Payer: Self-pay | Admitting: Internal Medicine

## 2015-07-12 NOTE — Telephone Encounter (Signed)
Routing to Neil Crouch to see if she has any further recommendations

## 2015-07-12 NOTE — Telephone Encounter (Signed)
I tried to call patient. LMOAM. Agree with instructions provided. Would avoid solid foods and push the clear liquids so that we can get colonoscopy done tomorrow to figure out what is causing all of her GI symptoms.   Hydrate to keep urine light in color.

## 2015-07-12 NOTE — Telephone Encounter (Signed)
Pt called this afternoon asking to speak with LSL. She said that LSL had LMOM earlier.  I told her that LSL had already left for the day and call was then given to GF.

## 2015-07-13 ENCOUNTER — Ambulatory Visit (HOSPITAL_COMMUNITY): Payer: Medicare Other | Admitting: Anesthesiology

## 2015-07-13 ENCOUNTER — Encounter (HOSPITAL_COMMUNITY): Payer: Self-pay | Admitting: *Deleted

## 2015-07-13 ENCOUNTER — Ambulatory Visit (HOSPITAL_COMMUNITY)
Admission: RE | Admit: 2015-07-13 | Discharge: 2015-07-13 | Disposition: A | Discharge status: Home / Self Care | Payer: Medicare Other | Source: Ambulatory Visit | Attending: Internal Medicine | Admitting: Internal Medicine

## 2015-07-13 ENCOUNTER — Encounter (HOSPITAL_COMMUNITY)
Admission: RE | Disposition: A | Discharge status: Home / Self Care | Payer: Self-pay | Source: Ambulatory Visit | Attending: Internal Medicine

## 2015-07-13 DIAGNOSIS — Q394 Esophageal web: Secondary | ICD-10-CM | POA: Diagnosis not present

## 2015-07-13 DIAGNOSIS — Z79899 Other long term (current) drug therapy: Secondary | ICD-10-CM | POA: Insufficient documentation

## 2015-07-13 DIAGNOSIS — K5730 Diverticulosis of large intestine without perforation or abscess without bleeding: Secondary | ICD-10-CM | POA: Diagnosis not present

## 2015-07-13 DIAGNOSIS — D123 Benign neoplasm of transverse colon: Secondary | ICD-10-CM | POA: Insufficient documentation

## 2015-07-13 DIAGNOSIS — R1314 Dysphagia, pharyngoesophageal phase: Secondary | ICD-10-CM | POA: Diagnosis not present

## 2015-07-13 DIAGNOSIS — R933 Abnormal findings on diagnostic imaging of other parts of digestive tract: Secondary | ICD-10-CM | POA: Insufficient documentation

## 2015-07-13 DIAGNOSIS — K625 Hemorrhage of anus and rectum: Secondary | ICD-10-CM | POA: Insufficient documentation

## 2015-07-13 DIAGNOSIS — Z8719 Personal history of other diseases of the digestive system: Secondary | ICD-10-CM | POA: Insufficient documentation

## 2015-07-13 DIAGNOSIS — K317 Polyp of stomach and duodenum: Secondary | ICD-10-CM | POA: Insufficient documentation

## 2015-07-13 DIAGNOSIS — R110 Nausea: Secondary | ICD-10-CM | POA: Insufficient documentation

## 2015-07-13 DIAGNOSIS — K222 Esophageal obstruction: Secondary | ICD-10-CM | POA: Insufficient documentation

## 2015-07-13 DIAGNOSIS — K219 Gastro-esophageal reflux disease without esophagitis: Secondary | ICD-10-CM | POA: Insufficient documentation

## 2015-07-13 DIAGNOSIS — R1010 Upper abdominal pain, unspecified: Secondary | ICD-10-CM | POA: Insufficient documentation

## 2015-07-13 DIAGNOSIS — Z860100 Personal history of colon polyps, unspecified: Secondary | ICD-10-CM | POA: Insufficient documentation

## 2015-07-13 DIAGNOSIS — R1310 Dysphagia, unspecified: Secondary | ICD-10-CM | POA: Diagnosis not present

## 2015-07-13 DIAGNOSIS — K449 Diaphragmatic hernia without obstruction or gangrene: Secondary | ICD-10-CM | POA: Diagnosis not present

## 2015-07-13 DIAGNOSIS — I10 Essential (primary) hypertension: Secondary | ICD-10-CM | POA: Insufficient documentation

## 2015-07-13 DIAGNOSIS — R1032 Left lower quadrant pain: Secondary | ICD-10-CM | POA: Insufficient documentation

## 2015-07-13 HISTORY — PX: ESOPHAGEAL DILATION: SHX303

## 2015-07-13 HISTORY — PX: ESOPHAGOGASTRODUODENOSCOPY (EGD) WITH PROPOFOL: SHX5813

## 2015-07-13 HISTORY — PX: COLONOSCOPY WITH PROPOFOL: SHX5780

## 2015-07-13 HISTORY — PX: POLYPECTOMY: SHX5525

## 2015-07-13 SURGERY — COLONOSCOPY WITH PROPOFOL
Anesthesia: Monitor Anesthesia Care

## 2015-07-13 MED ORDER — ONDANSETRON HCL 4 MG/2ML IJ SOLN: 4.0000 mg | Freq: Once | INTRAMUSCULAR | Status: DC | PRN

## 2015-07-13 MED ORDER — FENTANYL CITRATE (PF) 100 MCG/2ML IJ SOLN: 25.0000 ug | INTRAMUSCULAR | Status: DC | PRN

## 2015-07-13 MED ORDER — STERILE WATER FOR IRRIGATION IR SOLN
Status: DC | PRN
  Administered 2015-07-13: 1000 mL

## 2015-07-13 MED ORDER — FENTANYL CITRATE (PF) 100 MCG/2ML IJ SOLN
25.0000 ug | INTRAMUSCULAR | Status: AC
  Administered 2015-07-13 (×2): 25 ug via INTRAVENOUS

## 2015-07-13 MED ORDER — PROPOFOL INFUSION 10 MG/ML OPTIME
INTRAVENOUS | Status: DC | PRN
  Administered 2015-07-13: 125 ug/kg/min via INTRAVENOUS
  Administered 2015-07-13: 14:00:00 via INTRAVENOUS

## 2015-07-13 MED ORDER — FENTANYL CITRATE (PF) 100 MCG/2ML IJ SOLN
INTRAMUSCULAR | Status: DC | PRN
  Administered 2015-07-13: 25 ug via INTRAVENOUS

## 2015-07-13 MED ORDER — FENTANYL CITRATE (PF) 100 MCG/2ML IJ SOLN
INTRAMUSCULAR | Status: AC
  Filled 2015-07-13: qty 4

## 2015-07-13 MED ORDER — MIDAZOLAM HCL 2 MG/2ML IJ SOLN
INTRAMUSCULAR | Status: AC
  Filled 2015-07-13: qty 4

## 2015-07-13 MED ORDER — PROPOFOL 10 MG/ML IV BOLUS
INTRAVENOUS | Status: AC
  Filled 2015-07-13: qty 20

## 2015-07-13 MED ORDER — LIDOCAINE VISCOUS 2 % MT SOLN
OROMUCOSAL | Status: AC
  Filled 2015-07-13: qty 15

## 2015-07-13 MED ORDER — PROPOFOL 10 MG/ML IV BOLUS
INTRAVENOUS | Status: DC | PRN
  Administered 2015-07-13: 10 mg via INTRAVENOUS

## 2015-07-13 MED ORDER — LIDOCAINE VISCOUS 2 % MT SOLN
15.0000 mL | Freq: Once | OROMUCOSAL | Status: AC
  Administered 2015-07-13: 10 mL via OROMUCOSAL

## 2015-07-13 MED ORDER — MIDAZOLAM HCL 5 MG/5ML IJ SOLN
INTRAMUSCULAR | Status: DC | PRN
  Administered 2015-07-13: 2 mg via INTRAVENOUS

## 2015-07-13 MED ORDER — LACTATED RINGERS IV SOLN
INTRAVENOUS | Status: DC
  Administered 2015-07-13 (×2): via INTRAVENOUS

## 2015-07-13 MED ORDER — FENTANYL CITRATE (PF) 100 MCG/2ML IJ SOLN
INTRAMUSCULAR | Status: AC
  Filled 2015-07-13: qty 2

## 2015-07-13 MED ORDER — MIDAZOLAM HCL 2 MG/2ML IJ SOLN
1.0000 mg | INTRAMUSCULAR | Status: DC | PRN
  Administered 2015-07-13 (×3): 2 mg via INTRAVENOUS
  Filled 2015-07-13 (×2): qty 2

## 2015-07-13 MED ORDER — ONDANSETRON HCL 4 MG/2ML IJ SOLN
INTRAMUSCULAR | Status: AC
  Filled 2015-07-13: qty 2

## 2015-07-13 MED ORDER — WATER FOR IRRIGATION, STERILE IR SOLN
Status: DC | PRN
  Administered 2015-07-13: 1000 mL

## 2015-07-13 MED ORDER — MIDAZOLAM HCL 2 MG/2ML IJ SOLN
INTRAMUSCULAR | Status: AC
  Filled 2015-07-13: qty 2

## 2015-07-13 MED ORDER — ONDANSETRON HCL 4 MG/2ML IJ SOLN
4.0000 mg | Freq: Once | INTRAMUSCULAR | Status: AC
  Administered 2015-07-13: 4 mg via INTRAVENOUS

## 2015-07-13 SURGICAL SUPPLY — 35 items
BALLN CRE LF 10-12 240X5.5 (BALLOONS)
BALLN CRE LF 10-12MM 240X5.5 (BALLOONS)
BALLN DILATOR CRE 12-15 240 (BALLOONS)
BALLN DILATOR CRE 15-18 240 (BALLOONS) IMPLANT
BALLN DILATOR CRE 18-20 240 (BALLOONS) IMPLANT
BALLN DILATOR CRE WIREGUIDE (BALLOONS)
BALLOON CRE LF 10-12 240X5.5 (BALLOONS) IMPLANT
BALLOON DILATOR CRE 12-15 240 (BALLOONS) IMPLANT
BALLOON DILATOR CRE WIREGUIDE (BALLOONS) IMPLANT
BLOCK BITE 60FR ADLT L/F BLUE (MISCELLANEOUS) ×3 IMPLANT
DEVICE CLIP HEMOSTAT 235CM (CLIP) IMPLANT
ELECT REM PT RETURN 9FT ADLT (ELECTROSURGICAL)
ELECTRODE REM PT RTRN 9FT ADLT (ELECTROSURGICAL) IMPLANT
FCP BXJMBJMB 240X2.8X (CUTTING FORCEPS)
FLOOR PAD 36X40 (MISCELLANEOUS)
FORCEPS BIOP RAD 4 LRG CAP 4 (CUTTING FORCEPS) ×3 IMPLANT
FORCEPS BIOP RJ4 240 W/NDL (CUTTING FORCEPS)
FORCEPS BXJMBJMB 240X2.8X (CUTTING FORCEPS) IMPLANT
FORMALIN 10 PREFIL 20ML (MISCELLANEOUS) ×6 IMPLANT
INJECTOR/SNARE I SNARE (MISCELLANEOUS) IMPLANT
KIT ENDO PROCEDURE PEN (KITS) ×3 IMPLANT
MANIFOLD NEPTUNE II (INSTRUMENTS) ×3 IMPLANT
NEEDLE SCLEROTHERAPY 25GX240 (NEEDLE) IMPLANT
PAD FLOOR 36X40 (MISCELLANEOUS) IMPLANT
PROBE APC STR FIRE (PROBE) IMPLANT
PROBE INJECTION GOLD (MISCELLANEOUS)
PROBE INJECTION GOLD 7FR (MISCELLANEOUS) IMPLANT
SNARE ROTATE MED OVAL 20MM (MISCELLANEOUS) ×3 IMPLANT
SNARE SHORT THROW 13M SML OVAL (MISCELLANEOUS) IMPLANT
SYR 50ML LL SCALE MARK (SYRINGE) ×3 IMPLANT
SYR INFLATE BILIARY GAUGE (MISCELLANEOUS) IMPLANT
SYR INFLATION 60ML (SYRINGE) ×3 IMPLANT
TRAP SPECIMEN MUCOUS 40CC (MISCELLANEOUS) IMPLANT
TUBING IRRIGATION ENDOGATOR (MISCELLANEOUS) ×3 IMPLANT
WATER STERILE IRR 1000ML POUR (IV SOLUTION) ×3 IMPLANT

## 2015-07-13 NOTE — H&P (View-Only) (Signed)
Primary Care Physician: Glenda Chroman., MD  Primary Gastroenterologist:  Garfield Cornea, MD   Chief Complaint  Patient presents with  . Follow-up    not feeling good/ constipation    HPI: Kaitlin May is a 69 y.o. female here for follow-up. She is last seen on 06/08/2015 after hospitalization for CT documented diverticulitis. History of chronic abdominal pain, nausea, GERD, weight loss.Since that office visit she saw Dr. Arsenio Loader at [facility] on his 05/24/2015 at Dr. Roseanne Kaufman request. Please see his detailed note for complete details however essentially he felt she had diverticulosis, IBS with worse pain related to constipation. He felt that some of her pain related to L2-L3 left nerve root compression. He noted chronic anxiety. Did not feel like she had complicated diverticulitis. Advised her to get back on Metamucil and probiotic and follow-up locally.  03/31/2015 she had documented diverticulitis on CT. Subsequent follow-up CT on June 12 showed interval resolution of sigmoid diverticulitis. 06/03/2015 she was hospitalized for diverticulitis involving the lower sigmoid colon which appeared uncomplicated. Treated with Cipro and Flagyl although her Flagyl dose was decreased because intolerability/nausea. Also had a PET scan to evaluate new pulmonary nodule back on July 7 noted to have hypermetabolic activity associated with the sigmoid colon. Possibly related diverticulitis per radiologist. Patient's last colonoscopy was in 2012.  Patient called in to presented July with ongoing complaints. I reviewed recommended extending her Flagyl and Cipro for 7 more days although she tells me that she followed up with her primary care he stopped the Flagyl because of the nausea. Switched her Cipro to Levaquin. It is noted that her stool culture during her hospitalization grew rare coag positive staph which was sensitive to both Cipro and Levaquin.   Overall patient admits that her left lower quadrant  pain is much improved. She continues to have diffuse vague lower and upper abdominal discomfort. Worse with meals.Trying to limit pain medications due to constipation. Using when necessary Miralax. Used several gylcerin suppositories. Stool softener. Dr. Woody Seller gave her samples of Linzess 251mcg, took one Monday no results. Enema Tuesday morning with only return of enema. Few small bowel movements later. Took second [place] yesterday. Too strong for her. Multiple stools during the night. Denies any fever. Has been passing some blood per rectum. Complains of postprandial upper abdominal pain associate with swelling, nausea. No heartburn. Complains of esophageal dysphagia to pills.   Last dose of antibiotic on 06/20/2015.  No prior EGD. Niece has celiac disease.   Ongoing weight loss. Patient reports 40 pounds total.  04/2012 208 lb 12/2014 186 lb 01/2015 179 lb 03/2015 169 lb 05/2015 169 lb 8//2016 161 lb   Current Outpatient Prescriptions  Medication Sig Dispense Refill  . butalbital-acetaminophen-caffeine (FIORICET, ESGIC) 50-325-40 MG per tablet Take by mouth 2 (two) times daily as needed for headache. ONLY AS NEEDED FOR MIGRAINES    . lansoprazole (PREVACID) 30 MG capsule 30 mg daily.    Marland Kitchen LORazepam (ATIVAN) 1 MG tablet Take 1 mg by mouth 2 (two) times daily as needed.     . promethazine (PHENERGAN) 12.5 MG tablet Take 1 tablet (12.5 mg total) by mouth every 6 (six) hours as needed for nausea or vomiting (alternate with zofran). 30 tablet 0  . saccharomyces boulardii (FLORASTOR) 250 MG capsule Take 1 capsule (250 mg total) by mouth 2 (two) times daily. 60 capsule 1  . oxyCODONE (OXY IR/ROXICODONE) 5 MG immediate release tablet Take 5 mg by mouth every 4 (four) hours  as needed.      No current facility-administered medications for this visit.    Allergies as of 06/28/2015 - Review Complete 06/28/2015  Allergen Reaction Noted  . Penicillins Palpitations 03/04/2011  . Prednisone  01/18/2015    Past Medical History  Diagnosis Date  . Acoustic neuroma     in situ  . HTN (hypertension)   . Migraines     uses demerol and phenergan for migraines due to severity of pain, nausea  . Seasonal allergies   . GERD (gastroesophageal reflux disease)   . 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 Gala Romney  . Diverticula of colon   . Diverticulitis      Negative CTs in 2004, 2009, 2011, 2013. Positive for sigmoid diverticulitis in 03/2009  . C. difficile colitis   . Rectal fistula    Past Surgical History  Procedure Laterality Date  . Tonsillectomy    . Appendectomy    . Colonoscopy  03/25/11    Dr. Elwyn Reach sided diverticula, normal rectum, tubular adenoma. Due for next TCS 03/2018.   . Oophorectomy      LSO  . Abdominal hysterectomy      endometrial hyperplasia, irreg bleeding   Family History  Problem Relation Age of Onset  . Melanoma Father     deceased at 32  . Diabetes Mother   . Dementia Mother     deceased at 45  . Colon cancer Neg Hx    Social History   Social History  . Marital Status: Married    Spouse Name: N/A  . Number of Children: N/A  . Years of Education: N/A   Social History Main Topics  . Smoking status: Never Smoker   . Smokeless tobacco: Never Used     Comment: Never smoked  . Alcohol Use: No  . Drug Use: No  . Sexual Activity:    Partners: Male    Birth Control/ Protection:      Comment: 1st intercourse 31 yo-1 partner   Other Topics Concern  . None   Social History Narrative    ROS:  General: see hpi. Complains of fatigue, weakness. ENT: Negative for hoarseness,  nasal congestion. See hpi. CV: Negative for chest pain, angina, palpitations, dyspnea on exertion, peripheral edema.  Respiratory: Negative for dyspnea at rest, dyspnea on exertion, cough, sputum, wheezing.  GI: See history of present illness. GU:  Negative for dysuria, hematuria, urinary incontinence, urinary frequency, nocturnal urination.  Endo: see hpi.   Physical  Examination:   BP 120/79 mmHg  Pulse 101  Temp(Src) 97.3 F (36.3 C) (Oral)  Ht 5\' 8"  (1.727 m)  Wt 161 lb 9.6 oz (73.301 kg)  BMI 24.58 kg/m2  General: Well-nourished, well-developed in no acute distress. Accompanied by spouse.  Eyes: No icterus. Mouth: Oropharyngeal mucosa moist and pink , no lesions erythema or exudate. Lungs: Clear to auscultation bilaterally.  Heart: Regular rate and rhythm, no murmurs rubs or gallops.  Abdomen: Bowel sounds are normal, mild diffuse tenderness, nondistended, no hepatosplenomegaly or masses, no abdominal bruits or hernia , no rebound or guarding.   Extremities: No lower extremity edema. No clubbing or deformities. Neuro: Alert and oriented x 4   Skin: Warm and dry, no jaundice.   Psych: Alert and cooperative. Anxious.    Imaging Studies: No results found.   Impression/Plan: 69 year old female with ongoing abdominal pain, anorexia, nausea, weight loss. Complicated GI history as previously outlined. Has been treated with multiple rounds antibiotics for  diverticulitis and C. difficile for the past 10-12 months. Recent CT documentation of uncomplicated diverticulitis in May as well as July. PET scan positive in the sigmoid region as outlined. Could be related to inflammation from diverticulitis per radiologist. Patient's last colonoscopy was in 2012. She has been evaluated by Dr. Britta Mccreedy at [facility] during one of her hospitalizations as well as Dr. Arsenio Loader over [facility]. Suspected significant functional overlay as well. At the time there was not adequate documented diverticulitis to suggest surgical intervention.   Will discuss further with Dr. Gala Romney. Likely offer the patient colonoscopy and upper endoscopy plus or minus esophageal dilation for postprandial nausea, upper abdominal pain, esophageal dysphagia to pills, unexplained weight loss, evaluate abnormality on PET scan. Other option would be  repeat imaging to see if her diverticulitis has resolved although she has had multiple studies as previously outlined. Further recommendations to follow.  Patient requested I check her for celiac disease, not an unreasonable request therefore serologies were ordered. Check thyroid status as well. Continue Linzess for constipation but decrease dose to 149mcg daily on empty stomach.

## 2015-07-13 NOTE — Op Note (Signed)
[facility] [address] [place], [address]   ENDOSCOPY PROCEDURE REPORT  PATIENT: Kaitlin May, Kaitlin May  MR#: [id number] BIRTHDATE: [birth year] , 69  yrs. old GENDER: female ENDOSCOPIST: R.  Garfield Cornea, MD FACP FACG REFERRED BY:  Jerene Bears, M.D. PROCEDURE DATE:  2015/08/05 PROCEDURE:  EGD, diagnostic, with Maloney dilation of esophagus  and biopsy INDICATIONS:  Esophageal dysphagia. MEDICATIONS: Deep sedation per Dr.  [facility] ASA CLASS:      Class II  CONSENT: The risks, benefits, limitations, alternatives and imponderables have been discussed.  The potential for biopsy, esophogeal dilation, etc. have also been reviewed.  Questions have been answered.  All parties agreeable.  Please see the history and physical in the medical record for more information.  DESCRIPTION OF PROCEDURE: After the risks benefits and alternatives of the procedure were thoroughly explained, informed consent was obtained.  The    endoscope was introduced through the mouth and advanced to the second portion of the duodenum , limited by Without limitations. The instrument was slowly withdrawn as the mucosa was fully examined. Estimated blood loss is zero unless otherwise noted in this procedure report.    Prominent Schatzki's ring; otherwise normal-appearing esophagus.  EG junction easily traversed the scope.  Stomach empty.  3 cm hiatal hernia present.  Multiple 3-5 mm hyperplastic appearing polyps clustered around the diaphragmatic hiatus.  No ulcer or infiltrating process.  Patent pylorus.  Normal-appearing first and second portion of the duodenum.  Scope removed; a 74 French Maloney dilator was passed to full insertion.  Subsequently, a 107 French Maloney dilator was passed to full insertion with mild resistance.  A look back revealed the ring had been nicely ruptured without apparent complication.  There was minimal bleeding.  Finally, one of the hyperplastic  appearing polyps was biopsied.  Retroflexed views revealed a hiatal hernia. The scope was then withdrawn from the patient and the procedure completed.  COMPLICATIONS: There were no immediate complications. EBL 3 mL ENDOSCOPIC IMPRESSION: Schatzki's ring?"status post Maloney dilation.  Hiatal hernia. Multiple benign-appearing gastric polyps?"status post biopsy  RECOMMENDATIONS: Follow-up on pathology. Continue Prevacid 30 mg daily. See colonoscopy report.  REPEAT EXAM:  eSigned:  R. Garfield Cornea, MD [facility] 08/05/2015 3:08 PM    CC:  CPT CODES: ICD CODES:  The ICD and CPT codes recommended by this software are interpretations from the data that the clinical staff has captured with the software.  The verification of the translation of this report to the ICD and CPT codes and modifiers is the sole responsibility of the health care institution and practicing physician where this report was generated.  [place]. will not be held responsible for the validity of the ICD and CPT codes included on this report.  AMA assumes no liability for data contained or not contained herein. CPT is a Designer, television/film set of the [employer].  PATIENT NAME:  [name], [name] MR#: [id number]

## 2015-07-13 NOTE — Transfer of Care (Signed)
Immediate Anesthesia Transfer of Care Note  Patient: Kaitlin May  Procedure(s) Performed: Procedure(s): COLONOSCOPY WITH PROPOFOL; in cecum at 1430; withdrawal time 9 minutes    (procedure #1) (N/A) ESOPHAGOGASTRODUODENOSCOPY (EGD) WITH PROPOFOL (N/A) ESOPHAGEAL DILATION [place], [place] (N/A) POLYPECTOMY (N/A)  Patient Location: PACU  Anesthesia Type:MAC  Level of Consciousness: awake  Airway & Oxygen Therapy: Patient Spontanous Breathing  Post-op Assessment: Report given to RN  Post vital signs: Reviewed  Last Vitals:  Filed Vitals:   07/13/15 1345  BP: 122/75  Pulse:   Temp:   Resp: 17    Complications: No apparent anesthesia complications

## 2015-07-13 NOTE — Interval H&P Note (Signed)
History and Physical Interval Note:  07/13/2015 2:01 PM  Kaitlin May  has presented today for surgery, with the diagnosis of abnormal colon on CT, weight loss, epigastric pain, nausea, dysphagia  The various methods of treatment have been discussed with the patient and family. After consideration of risks, benefits and other options for treatment, the patient has consented to  Procedure(s) with comments: COLONOSCOPY WITH PROPOFOL (N/A) - 1215 ESOPHAGOGASTRODUODENOSCOPY (EGD) WITH PROPOFOL (N/A) ESOPHAGEAL DILATION (N/A) as a surgical intervention .  The patient's history has been reviewed, patient examined, no change in status, stable for surgery.  I have reviewed the patient's chart and labs.  Questions were answered to the patient's satisfaction.     Robert Rourk  No change. EGD with dilation as appropriate/feasible and diagnostic colonoscopy per plan.  The risks, benefits, limitations, imponderables and alternatives regarding both EGD and colonoscopy have been reviewed with the patient. Questions have been answered. All parties agreeable.

## 2015-07-13 NOTE — Op Note (Signed)
[facility] [address] [place], [address]   COLONOSCOPY PROCEDURE REPORT  PATIENT: Kaitlin May, Kaitlin May  MR#: [id number] BIRTHDATE: 29-Jul-1946 , 69  yrs. old GENDER: female ENDOSCOPIST: R.  Garfield Cornea, MD FACP [facility] REFERRED QR:[id number] Woody Seller, M.D. PROCEDURE DATE:  08-02-2015 PROCEDURE:   Colonoscopy with snare polypectomy INDICATIONS:Recurrent diverticulitis.  Abnormal sigmoid colon on CT hypermetabolic activity seen in the left colon on PET scan. MEDICATIONS: Deep sedation per Dr.  [facility] ASA CLASS:       Class III  CONSENT: The risks, benefits, alternatives and imponderables including but not limited to bleeding, perforation as well as the possibility of a missed lesion have been reviewed.  The potential for biopsy, lesion removal, etc. have also been discussed. Questions have been answered.  All parties agreeable.  Please see the history and physical in the medical record for more information.  DESCRIPTION OF PROCEDURE:   After the risks benefits and alternatives of the procedure were thoroughly explained, informed consent was obtained.  The digital rectal exam revealed no abnormalities of the rectum.   The     endoscope was introduced through the anus and advanced to the cecum, which was identified by both the appendix and ileocecal valve. No adverse events experienced.   The quality of the prep was adequate  The instrument was then slowly withdrawn as the colon was fully examined. Estimated blood loss is zero unless otherwise noted in this procedure report.      COLON FINDINGS: Normal-appearing rectal mucosa.  Densely populated left-sided diverticula D: (1) 5 millimeter pedunculated polyp at the splenic flexure; otherwise, the colonic mucosa appeared normal.  The above-mentioned polyps cold snare removed and recovered for the pathologist.  Retroflexion was performed. .  Withdrawal time=9 minutes 0 seconds.  The scope was withdrawn  and the procedure completed. COMPLICATIONS: There were no immediate complications.  ENDOSCOPIC IMPRESSION: Marked left colon colonic diverticulosis. No evidence of colonic neoplasm. Single colonic polyp?"removed as described above.  I suspect the PET scan abnormality was a result of residual diverticulitis. Patient has had recurrent CT documented diverticulitis with, likely, multiple other less significant episodes.  RECOMMENDATIONS: Follow-up on pathology. Elective surgery consultation for left hemicolectomy in the near future. See EGD report.  eSigned:  R. Garfield Cornea, MD Rosalita [facility]ss[facility] 2015/08/02 3:14 PM   cc:  CPT CODES: ICD CODES:  The ICD and CPT codes recommended by this software are interpretations from the data that the clinical staff has captured with the software.  The verification of the translation of this report to the ICD and CPT codes and modifiers is the sole responsibility of the health care institution and practicing physician where this report was generated.  [place]. will not be held responsible for the validity of the ICD and CPT codes included on this report.  AMA assumes no liability for data contained or not contained herein. CPT is a Designer, television/film set of the [employer].  PATIENT NAME:  Jacara, Benito MR#: [id number]

## 2015-07-13 NOTE — Anesthesia Preprocedure Evaluation (Signed)
Anesthesia Evaluation  Patient identified by MRN, date of birth, ID band Patient awake    Reviewed: Allergy & Precautions, NPO status , Patient's Chart, lab work & pertinent test results  Airway Mallampati: II  TM Distance: >3 FB     Dental  (+) Implants   Pulmonary  breath sounds clear to auscultation        Cardiovascular negative cardio ROS  Rhythm:Regular Rate:Normal     Neuro/Psych    GI/Hepatic GERD-  Medicated and Controlled,  Endo/Other    Renal/GU      Musculoskeletal   Abdominal   Peds  Hematology   Anesthesia Other Findings   Reproductive/Obstetrics                             Anesthesia Physical Anesthesia Plan  ASA: II  Anesthesia Plan: MAC   Post-op Pain Management:    Induction: Intravenous  Airway Management Planned: Simple Face Mask  Additional Equipment:   Intra-op Plan:   Post-operative Plan:   Informed Consent: I have reviewed the patients History and Physical, chart, labs and discussed the procedure including the risks, benefits and alternatives for the proposed anesthesia with the patient or authorized representative who has indicated his/her understanding and acceptance.     Plan Discussed with:   Anesthesia Plan Comments:         Anesthesia Quick Evaluation

## 2015-07-13 NOTE — Discharge Instructions (Signed)
Colonoscopy Discharge Instructions  Read the instructions outlined below and refer to this sheet in the next few weeks. These discharge instructions provide you with general information on caring for yourself after you leave the hospital. Your doctor may also give you specific instructions. While your treatment has been planned according to the most current medical practices available, unavoidable complications occasionally occur. If you have any problems or questions after discharge, call Dr. Gala Romney at [phone]. ACTIVITY  You may resume your regular activity, but move at a slower pace for the next 24 hours.   Take frequent rest periods for the next 24 hours.   Walking will help get rid of the air and reduce the bloated feeling in your belly (abdomen).   No driving for 24 hours (because of the medicine (anesthesia) used during the test).    Do not sign any important legal documents or operate any machinery for 24 hours (because of the anesthesia used during the test).  NUTRITION  Drink plenty of fluids.   You may resume your normal diet as instructed by your doctor.   Begin with a light meal and progress to your normal diet. Heavy or fried foods are harder to digest and may make you feel sick to your stomach (nauseated).   Avoid alcoholic beverages for 24 hours or as instructed.  MEDICATIONS  You may resume your normal medications unless your doctor tells you otherwise.  WHAT YOU CAN EXPECT TODAY  Some feelings of bloating in the abdomen.   Passage of more gas than usual.   Spotting of blood in your stool or on the toilet paper.  IF YOU HAD POLYPS REMOVED DURING THE COLONOSCOPY:  No aspirin products for 7 days or as instructed.   No alcohol for 7 days or as instructed.   Eat a soft diet for the next 24 hours.  FINDING OUT THE RESULTS OF YOUR TEST Not all test results are available during your visit. If your test results are not back during the visit, make an appointment  with your caregiver to find out the results. Do not assume everything is normal if you have not heard from your caregiver or the medical facility. It is important for you to follow up on all of your test results.  SEEK IMMEDIATE MEDICAL ATTENTION IF:  You have more than a spotting of blood in your stool.   Your belly is swollen (abdominal distention).   You are nauseated or vomiting.   You have a temperature over 101.  You have abdominal pain or discomfort that is severe or gets worse throughout the day. EGD Discharge instructions Please read the instructions outlined below and refer to this sheet in the next few weeks. These discharge instructions provide you with general information on caring for yourself after you leave the hospital. Your doctor may also give you specific instructions. While your treatment has been planned according to the most current medical practices available, unavoidable complications occasionally occur. If you have any problems or questions after discharge, please call your doctor. ACTIVITY You may resume your regular activity but move at a slower pace for the next 24 hours.  Take frequent rest periods for the next 24 hours.  Walking will help expel (get rid of) the air and reduce the bloated feeling in your abdomen.  No driving for 24 hours (because of the anesthesia (medicine) used during the test).  You may shower.  Do not sign any important legal documents or operate any machinery for 24  hours (because of the anesthesia used during the test).  NUTRITION Drink plenty of fluids.  You may resume your normal diet.  Begin with a light meal and progress to your normal diet.  Avoid alcoholic beverages for 24 hours or as instructed by your caregiver.  MEDICATIONS You may resume your normal medications unless your caregiver tells you otherwise.  WHAT YOU CAN EXPECT TODAY You may experience abdominal discomfort such as a feeling of fullness or gas pains.   FOLLOW-UP Your doctor will discuss the results of your test with you.  SEEK IMMEDIATE MEDICAL ATTENTION IF ANY OF THE FOLLOWING OCCUR: Excessive nausea (feeling sick to your stomach) and/or vomiting.  Severe abdominal pain and distention (swelling).  Trouble swallowing.  Temperature over 101 F (37.8 C).  Rectal bleeding or vomiting of blood.     GERD information provided  Diverticulosis and polyp information provided  Continue Prevacid 30 mg daily  Further recommendations to follow pending review of pathology report  Colon Polyps Polyps are lumps of extra tissue growing inside the body. Polyps can grow in the large intestine (colon). Most colon polyps are noncancerous (benign). However, some colon polyps can become cancerous over time. Polyps that are larger than a pea may be harmful. To be safe, caregivers remove and test all polyps. CAUSES  Polyps form when mutations in the genes cause your cells to grow and divide even though no more tissue is needed. RISK FACTORS There are a number of risk factors that can increase your chances of getting colon polyps. They include:  Being older than 50 years.  Family history of colon polyps or colon cancer.  Long-term colon diseases, such as colitis or Crohn disease.  Being overweight.  Smoking.  Being inactive.  Drinking too much alcohol. SYMPTOMS  Most small polyps do not cause symptoms. If symptoms are present, they may include:  Blood in the stool. The stool may look dark red or black.  Constipation or diarrhea that lasts longer than 1 week. DIAGNOSIS People often do not know they have polyps until their caregiver finds them during a regular checkup. Your caregiver can use 4 tests to check for polyps:  Digital rectal exam. The caregiver wears gloves and feels inside the rectum. This test would find polyps only in the rectum.  Barium enema. The caregiver puts a liquid called barium into your rectum before taking X-rays of  your colon. Barium makes your colon look white. Polyps are dark, so they are easy to see in the X-ray pictures.  Sigmoidoscopy. A thin, flexible tube (sigmoidoscope) is placed into your rectum. The sigmoidoscope has a light and tiny camera in it. The caregiver uses the sigmoidoscope to look at the last third of your colon.  Colonoscopy. This test is like sigmoidoscopy, but the caregiver looks at the entire colon. This is the most common method for finding and removing polyps. TREATMENT  Any polyps will be removed during a sigmoidoscopy or colonoscopy. The polyps are then tested for cancer. PREVENTION  To help lower your risk of getting more colon polyps:  Eat plenty of fruits and vegetables. Avoid eating fatty foods.  Do not smoke.  Avoid drinking alcohol.  Exercise every day.  Lose weight if recommended by your caregiver.  Eat plenty of calcium and folate. Foods that are rich in calcium include milk, cheese, and broccoli. Foods that are rich in folate include chickpeas, kidney beans, and spinach. HOME CARE INSTRUCTIONS Keep all follow-up appointments as directed by your caregiver. You may  need periodic exams to check for polyps. SEEK MEDICAL CARE IF: You notice bleeding during a bowel movement. Document Released: 07/31/2004 Document Revised: 01/27/2012 Document Reviewed: 01/14/2012 [facility] Patient Information 2015 [place], [place]. This information is not intended to replace advice given to you by your health care provider. Make sure you discuss any questions you have with your health care provider. Diverticulosis Diverticulosis is the condition that develops when small pouches (diverticula) form in the wall of your colon. Your colon, or large intestine, is where water is absorbed and stool is formed. The pouches form when the inside layer of your colon pushes through weak spots in the outer layers of your colon. CAUSES  No one knows exactly what causes diverticulosis. RISK  FACTORS  Being older than 30. Your risk for this condition increases with age. Diverticulosis is rare in people younger than 40 years. By age 2, almost everyone has it.  Eating a low-fiber diet.  Being frequently constipated.  Being overweight.  Not getting enough exercise.  Smoking.  Taking over-the-counter pain medicines, like aspirin and ibuprofen. SYMPTOMS  Most people with diverticulosis do not have symptoms. DIAGNOSIS  Because diverticulosis often has no symptoms, health care providers often discover the condition during an exam for other colon problems. In many cases, a health care provider will diagnose diverticulosis while using a flexible scope to examine the colon (colonoscopy). TREATMENT  If you have never developed an infection related to diverticulosis, you may not need treatment. If you have had an infection before, treatment may include:  Eating more fruits, vegetables, and grains.  Taking a fiber supplement.  Taking a live bacteria supplement (probiotic).  Taking medicine to relax your colon. HOME CARE INSTRUCTIONS   Drink at least 6-8 glasses of water each day to prevent constipation.  Try not to strain when you have a bowel movement.  Keep all follow-up appointments. If you have had an infection before:  Increase the fiber in your diet as directed by your health care provider or dietitian.  Take a dietary fiber supplement if your health care provider approves.  Only take medicines as directed by your health care provider. SEEK MEDICAL CARE IF:   You have abdominal pain.  You have bloating.  You have cramps.  You have not gone to the bathroom in 3 days. SEEK IMMEDIATE MEDICAL CARE IF:   Your pain gets worse.  Yourbloating becomes very bad.  You have a fever or chills, and your symptoms suddenly get worse.  You begin vomiting.  You have bowel movements that are bloody or black. MAKE SURE YOU:  Understand these instructions.  Will  watch your condition.  Will get help right away if you are not doing well or get worse. Document Released: 08/01/2004 Document Revised: 11/09/2013 Document Reviewed: 09/29/2013 [facility] Patient Information 2015 [place], [place]. This information is not intended to replace advice given to you by your health care provider. Make sure you discuss any questions you have with your health care provider. Gastroesophageal Reflux Disease, Adult Gastroesophageal reflux disease (GERD) happens when acid from your stomach flows up into the esophagus. When acid comes in contact with the esophagus, the acid causes soreness (inflammation) in the esophagus. Over time, GERD may create small holes (ulcers) in the lining of the esophagus. CAUSES   Increased body weight. This puts pressure on the stomach, making acid rise from the stomach into the esophagus.  Smoking. This increases acid production in the stomach.  Drinking alcohol. This causes decreased pressure in the  lower esophageal sphincter (valve or ring of muscle between the esophagus and stomach), allowing acid from the stomach into the esophagus.  Late evening meals and a full stomach. This increases pressure and acid production in the stomach.  A malformed lower esophageal sphincter. Sometimes, no cause is found. SYMPTOMS   Burning pain in the lower part of the mid-chest behind the breastbone and in the mid-stomach area. This may occur twice a week or more often.  Trouble swallowing.  Sore throat.  Dry cough.  Asthma-like symptoms including chest tightness, shortness of breath, or wheezing. DIAGNOSIS  Your caregiver may be able to diagnose GERD based on your symptoms. In some cases, X-rays and other tests may be done to check for complications or to check the condition of your stomach and esophagus. TREATMENT  Your caregiver may recommend over-the-counter or prescription medicines to help decrease acid production. Ask your caregiver before  starting or adding any new medicines.  HOME CARE INSTRUCTIONS   Change the factors that you can control. Ask your caregiver for guidance concerning weight loss, quitting smoking, and alcohol consumption.  Avoid foods and drinks that make your symptoms worse, such as:  Caffeine or alcoholic drinks.  Chocolate.  Peppermint or mint flavorings.  Garlic and onions.  Spicy foods.  Citrus fruits, such as oranges, lemons, or limes.  Tomato-based foods such as sauce, chili, salsa, and pizza.  Fried and fatty foods.  Avoid lying down for the 3 hours prior to your bedtime or prior to taking a nap.  Eat small, frequent meals instead of large meals.  Wear loose-fitting clothing. Do not wear anything tight around your waist that causes pressure on your stomach.  Raise the head of your bed 6 to 8 inches with wood blocks to help you sleep. Extra pillows will not help.  Only take over-the-counter or prescription medicines for pain, discomfort, or fever as directed by your caregiver.  Do not take aspirin, ibuprofen, or other nonsteroidal anti-inflammatory drugs (NSAIDs). SEEK IMMEDIATE MEDICAL CARE IF:   You have pain in your arms, neck, jaw, teeth, or back.  Your pain increases or changes in intensity or duration.  You develop nausea, vomiting, or sweating (diaphoresis).  You develop shortness of breath, or you faint.  Your vomit is green, yellow, black, or looks like coffee grounds or blood.  Your stool is red, bloody, or black. These symptoms could be signs of other problems, such as heart disease, gastric bleeding, or esophageal bleeding. MAKE SURE YOU:   Understand these instructions.  Will watch your condition.  Will get help right away if you are not doing well or get worse. Document Released: 08/14/2005 Document Revised: 01/27/2012 Document Reviewed: 05/24/2011 [facility] Patient Information 2015 [place], [place]. This information is not intended to replace advice given to  you by your health care provider. Make sure you discuss any questions you have with your health care provider.

## 2015-07-13 NOTE — Addendum Note (Signed)
Addendum  created 07/13/15 1506 by Ollen Bowl, CRNA   Modules edited: Anesthesia Events

## 2015-07-13 NOTE — Telephone Encounter (Signed)
I talked with her and informed her that [facility] had already left for the day. I also told her to just push the fluids and she should be fine .She was unset that she could not speak to LSL.

## 2015-07-13 NOTE — Anesthesia Postprocedure Evaluation (Signed)
  Anesthesia Post-op Note  Patient: Kaitlin May  Procedure(s) Performed: Procedure(s): COLONOSCOPY WITH PROPOFOL; in cecum at 1430; withdrawal time 9 minutes    (procedure #1) (N/A) ESOPHAGOGASTRODUODENOSCOPY (EGD) WITH PROPOFOL (N/A) ESOPHAGEAL DILATION [place] (N/A) POLYPECTOMY (N/A)  Patient Location: PACU  Anesthesia Type:MAC  Level of Consciousness: awake, alert  and oriented  Airway and Oxygen Therapy: Patient Spontanous Breathing  Post-op Pain: none  Post-op Assessment: Post-op Vital signs reviewed, Patient's Cardiovascular Status Stable, Respiratory Function Stable, Patent Airway and No signs of Nausea or vomiting              Post-op Vital Signs: Reviewed and stable  Last Vitals:  Filed Vitals:   07/13/15 1345  BP: 122/75  Pulse:   Temp:   Resp: 17    Complications: No apparent anesthesia complications

## 2015-07-14 ENCOUNTER — Encounter (HOSPITAL_COMMUNITY): Payer: Self-pay | Admitting: Internal Medicine

## 2015-07-17 ENCOUNTER — Encounter: Payer: Self-pay | Admitting: Internal Medicine

## 2015-07-17 ENCOUNTER — Telehealth: Payer: Self-pay

## 2015-07-17 NOTE — Telephone Encounter (Signed)
Per RMR- Send letter to patient.  Send copy of letter with path to referring provider and PCP.   Patient needs to be referred to a general surgeon for consideration of a left hemicolectomy-recurrent diverticulitis

## 2015-07-17 NOTE — Telephone Encounter (Signed)
Letter mailed to the pt. 

## 2015-07-18 ENCOUNTER — Other Ambulatory Visit: Payer: Self-pay

## 2015-07-18 DIAGNOSIS — R1030 Lower abdominal pain, unspecified: Secondary | ICD-10-CM

## 2015-07-18 DIAGNOSIS — K5792 Diverticulitis of intestine, part unspecified, without perforation or abscess without bleeding: Secondary | ICD-10-CM

## 2015-07-18 NOTE — Telephone Encounter (Signed)
Referral has been made.

## 2015-07-19 ENCOUNTER — Other Ambulatory Visit: Payer: Self-pay | Admitting: Gastroenterology

## 2015-07-19 ENCOUNTER — Telehealth: Payer: Self-pay

## 2015-07-19 NOTE — Telephone Encounter (Signed)
I spoke to patient. Went over results. Explained why she needs to see the surgeon. Having constipation issues again. Restart Fiberchoice two daily and miralax once daily prn.  Hold on Linzess for now, she reports 12mcg doesn't work and 241mcg causes watery stool.   She will call back with any further questions or concerns.

## 2015-07-19 NOTE — Telephone Encounter (Signed)
Noted  

## 2015-07-19 NOTE — Telephone Encounter (Signed)
Pt is aware of her appointment with Dr. Arnoldo Morale. She does not understand some thing about her TCS/EGD results. She would like to talk with RMR or LSL. I tried to answer her questions but she was not happy with me. She stated that she need to talk to the doctors. Her call back numbers are  Cell [phone] Home #:[phone]

## 2015-08-07 ENCOUNTER — Telehealth: Payer: Self-pay | Admitting: Internal Medicine

## 2015-08-07 NOTE — Telephone Encounter (Signed)
Patient called this afternoons saying that we referred her to Dr Arnoldo Morale and she isn't sure that's who she wants to see. She is going to check around for another surgeon like [facility] and get back with [place].

## 2015-08-08 NOTE — Telephone Encounter (Signed)
Noted  

## 2015-09-07 ENCOUNTER — Telehealth: Payer: Self-pay | Admitting: Internal Medicine

## 2015-09-07 NOTE — Telephone Encounter (Signed)
PLEASE CALL PATIENT REGARDING HER PCP REFERRING HER FOR HER SURGERY  SHE HAS MULTIPLE QUESTIONS

## 2015-09-13 NOTE — Telephone Encounter (Signed)
Called pt. She is concerned that she may have Hpylori, Pt states she has had diverticulitis and was hospitalized Oct 10th. With more ABT therapy.  Pt states she just feels bad all the time and she can barely eat..  Complains of nausea and already has nausea medication that she is using.  Pt wants to know if we can do the Hpylori blood test now or does she need to wait until her OV on 09/19/2015.

## 2015-09-14 NOTE — Telephone Encounter (Signed)
Patient needs to get the diverticulitis issue out of the way. Diverticulitis issue unrelated to any potential H pylori infection

## 2015-09-15 NOTE — Telephone Encounter (Signed)
Spoke with Kaitlin May and she is still going to keep her appointment with Dr. Gala Romney to discuss everything.

## 2015-09-19 ENCOUNTER — Encounter: Payer: Self-pay | Admitting: Internal Medicine

## 2015-09-19 ENCOUNTER — Ambulatory Visit (INDEPENDENT_AMBULATORY_CARE_PROVIDER_SITE_OTHER): Payer: Medicare Other | Admitting: Internal Medicine

## 2015-09-19 VITALS — BP 126/70 | HR 84 | Temp 97.6°F | Ht 68.0 in | Wt 162.6 lb

## 2015-09-19 DIAGNOSIS — K222 Esophageal obstruction: Secondary | ICD-10-CM

## 2015-09-19 DIAGNOSIS — K219 Gastro-esophageal reflux disease without esophagitis: Secondary | ICD-10-CM

## 2015-09-19 DIAGNOSIS — Q394 Esophageal web: Secondary | ICD-10-CM | POA: Diagnosis not present

## 2015-09-19 DIAGNOSIS — K5732 Diverticulitis of large intestine without perforation or abscess without bleeding: Secondary | ICD-10-CM

## 2015-09-19 DIAGNOSIS — K5730 Diverticulosis of large intestine without perforation or abscess without bleeding: Secondary | ICD-10-CM | POA: Diagnosis not present

## 2015-09-19 NOTE — Progress Notes (Signed)
Primary Care Physician:  Glenda Chroman., MD Primary Gastroenterologist:  Dr. Gala Romney  Pre-Procedure History & Physical: HPI:  Kaitlin May is a 69 y.o. female here for follow-up of recurrent diverticulitis and irritable bowel syndrome-constipation predominant. Patient has multiple bouts of left lower quadrant abdominal pain over the past year or so. She's been treated with multiple rounds of Cipro and Flagyl. Course complicated by Clostridium difficile infection. Diverticulitis in the sigmoid region has been documented on at least 2 CT scans earlier this year. In fact, patient states was hospitalized at [facility] a month ago with recurrent diverticulitis and she reports the cat scan "showed it". She just finished a course of Cipro and Flagyl. Left lower quadrant abdominal pain has improved. She is usually chronically constipated taking Linzess alternating alternating with MiraLax. Colonoscopy earlier this year demonstrated a small splenic flexure adenoma and left-sided diverticula. She had gone over to [facility] earlier in the year and was seen by Dr. Creed Copper for second opinion. He felt that she had uncomplicated diverticulitis, irritable bowel syndrome and functional overlay along with a possible left-sided L2-L3 radiculopathy.  Subsequent to the [facility] visit, she developed another bout of diverticulitis documented on CT scan. If one month ago, diverticulitis was documented on a [place] scan, that would be 3 episodes of uncomplicated sigmoid diverticulitis this year. Most likely, she has had a additional bouts which have been treated empirically with antibiotics.  Earlier this year she underwent dilation of her Schatzki's ring. She tells me she is swallowing much better. GERD symptoms well controlled on Prevacid 30 mg daily.  She was referred to Dr. Arnoldo Morale for consideration of a left hemicolectomy. She decided not to keep that appointment. Dr. Woody Seller, her PCP, make her an appointment see Dr.  Fanny Skates  - that appointment is coming up on November 8.  She has numerous questions and concerns about GI and non-GI issues.  Some time spent addressing of the above-mentioned GI issues in the office today along with her husband.  Past Medical History  Diagnosis Date  . Acoustic neuroma (HCC)     in situ  . HTN (hypertension)   . Migraines     uses demerol and phenergan for migraines due to severity of pain, nausea  . Seasonal allergies   . GERD (gastroesophageal reflux disease)   . 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 Gala Romney  . Diverticula of colon   . Diverticulitis      Negative CTs in 2004, 2009, 2011, 2013. Positive for sigmoid diverticulitis in 03/2009  . C. difficile colitis   . Rectal fistula     Past Surgical History  Procedure Laterality Date  . Tonsillectomy    . Appendectomy    . Colonoscopy  03/25/11    Dr. Elwyn Reach sided diverticula, normal rectum, tubular adenoma. Due for next TCS 03/2018.   . Oophorectomy      LSO  . Abdominal hysterectomy      endometrial hyperplasia, irreg bleeding  . Colonoscopy with propofol N/A 07/13/2015    Dr.Mercia Dowe- marked left colon colonic diverticulosis. no evidence of colonic neoplasm. single colonic polyp at the splenic flexure. bx= tubular adenoma  . Esophagogastroduodenoscopy (egd) with propofol N/A 07/13/2015    Dr.Caleigh Rabelo- prominent schatzki's ring, o/w normal appearing esohagus, hiatal hernia, multiple benign-appearing gastric polyps bx= benign fundic gland polyp  . Esophageal dilation N/A 07/13/2015    Procedure: ESOPHAGEAL DILATION [address], [place];  Surgeon: Daneil Dolin, MD;  Location:  [facility];  Service: Endoscopy;  Laterality: N/A;  . Polypectomy N/A 07/13/2015    Procedure: POLYPECTOMY;  Surgeon: Daneil Dolin, MD;  Location: [facility];  Service: Endoscopy;  Laterality: N/A;    Prior to Admission medications   Medication Sig Start Date End Date Taking? Authorizing Provider  butalbital-acetaminophen-caffeine  (FIORICET, ESGIC) 50-325-40 MG per tablet Take by mouth 2 (two) times daily as needed for headache. ONLY AS NEEDED FOR MIGRAINES   Yes Historical Provider, MD  lansoprazole (PREVACID) 30 MG capsule 30 mg daily. 04/18/15  Yes Historical Provider, MD  Linaclotide Rolan Lipa) 145 MCG CAPS capsule Take 1 capsule (145 mcg total) by mouth daily. On empty stomach. 06/28/15  Yes Mahala Menghini, PA-C  LORazepam (ATIVAN) 1 MG tablet Take 1 mg by mouth 3 (three) times daily as needed.    Yes Historical Provider, MD  ondansetron (ZOFRAN) 4 MG tablet  06/13/15  Yes Historical Provider, MD  oxyCODONE (OXY IR/ROXICODONE) 5 MG immediate release tablet Take 5 mg by mouth every 4 (four) hours as needed for severe pain.  05/08/15  Yes Historical Provider, MD  promethazine (PHENERGAN) 12.5 MG tablet Take 1 tablet (12.5 mg total) by mouth every 6 (six) hours as needed for nausea or vomiting (alternate with zofran). 04/04/15  Yes Mahala Menghini, PA-C  saccharomyces boulardii (FLORASTOR) 250 MG capsule Take 1 capsule (250 mg total) by mouth 2 (two) times daily. 03/31/15  Yes Mahala Menghini, PA-C    Allergies as of 09/19/2015 - Review Complete 09/19/2015  Allergen Reaction Noted  . Dilaudid [hydromorphone hcl] Nausea And Vomiting 07/10/2015  . Prednisone  01/18/2015    Family History  Problem Relation Age of Onset  . Melanoma Father     deceased at 60  . Diabetes Mother   . Dementia Mother     deceased at 18  . Colon cancer Neg Hx     Social History   Social History  . Marital Status: Married    Spouse Name: N/A  . Number of Children: N/A  . Years of Education: N/A   Occupational History  . Not on file.   Social History Main Topics  . Smoking status: Never Smoker   . Smokeless tobacco: Never Used     Comment: Never smoked  . Alcohol Use: No  . Drug Use: No  . Sexual Activity:    Partners: Male    Birth Control/ Protection:      Comment: 1st intercourse 38 yo-1 partner   Other Topics Concern  . Not  on file   Social History Narrative    Review of Systems: See HPI, otherwise negative ROS  Physical Exam: BP 126/70 mmHg  Pulse 84  Temp(Src) 97.6 F (36.4 C) (Oral)  Ht 5\' 8"  (1.727 m)  Wt 162 lb 9.6 oz (73.755 kg)  BMI 24.73 kg/m2 General:   Alert,  , well-nourished, pleasant and cooperative in NAD. She does appear anxious. She is accompanied by her husband. Neck:  Supple; no masses or thyromegaly. No significant cervical adenopathy. Lungs:  Clear throughout to auscultation.   No wheezes, crackles, or rhonchi. No acute distress. Heart:  Regular rate and rhythm; no murmurs, clicks, rubs,  or gallops. Abdomen: Non-distended, normal bowel sounds.  Soft with some bilateral lower quadrant abdominal tenderness. No appreciable mass or organomegaly Pulses:  Normal pulses noted. Extremities:  Without clubbing or edema.  Impression:  Pleasant 69 year old lady with recurrent bouts of sigmoid diverticulitis documented on 2 CTs (and possibly a  third) this year. She's had numerous other similar episodes treated with antibiotics empirically. Antibiotic therapy been complicated by Clostridium difficile infection.  She no doubt has baseline IBS-constipation predominant. She has quite a bit of free-floating anxiety as well. At this point in time, even though she's had multiple bouts of uncomplicated diverticulitis, she has had multiple bouts.  Therefore, I continue to recommend she see a surgeon regarding an elective segmental resection of her left colon.   I spent considerable time today discussing the reasoning behind recommending a segmental resection. It is most likely that she would continue to have some GI complaints in the realm of  irritable bowel syndrome and possibly an element of chronic abdominal pain.  However, hopefully, surgery would alleviate future bouts of diverticulitis. She and her husband seem to understand this concept.  As a separate issue, her GERD symptoms are well controlled on  Prevacid 30 mg daily. Dysphagia resolved since her Schatzki's ring was dilated.  Recommendations:    Continue Prevacid 30 mg daily. May continue Linzess and /or MiraLax for constipation.  She  is to keep appointment with Dr. Fanny Skates on November 8.  Will await his feedback in regards to potential surgical management.  Were retrieve a copy of the [facility] hospital CT scan done one month ago.            Notice: This dictation was prepared with Dragon dictation along with smaller phrase technology. Any transcriptional errors that result from this process are unintentional and may not be corrected upon review.

## 2015-09-19 NOTE — Patient Instructions (Signed)
Continue Prevacid 30 mg daily  Repeat colonoscopy for surveillance purposes in 5 years  Keep appointment with Dr. Dalbert Batman next week

## 2015-11-21 DIAGNOSIS — R109 Unspecified abdominal pain: Secondary | ICD-10-CM | POA: Diagnosis not present

## 2015-11-22 ENCOUNTER — Other Ambulatory Visit: Payer: Self-pay | Admitting: General Surgery

## 2015-11-22 DIAGNOSIS — K5732 Diverticulitis of large intestine without perforation or abscess without bleeding: Secondary | ICD-10-CM

## 2015-11-23 ENCOUNTER — Telehealth: Payer: Self-pay | Admitting: Internal Medicine

## 2015-11-23 DIAGNOSIS — R109 Unspecified abdominal pain: Secondary | ICD-10-CM | POA: Diagnosis not present

## 2015-11-23 DIAGNOSIS — R110 Nausea: Secondary | ICD-10-CM | POA: Diagnosis not present

## 2015-11-23 NOTE — Telephone Encounter (Signed)
Patient is requesting to schedule an appointment with Dr. Carlean Purl for a 2nd opinion on diverticulitis. She has recently been seen by Dr. Gala Romney. Records are in EPIC. Patient states that she also saw a gastroenterologist at [facility] recently. She will have records faxed over to [place].

## 2015-11-24 NOTE — Telephone Encounter (Signed)
Received the additional records. Placed on Dr. Celesta Aver desk for review.

## 2015-11-29 ENCOUNTER — Ambulatory Visit
Admission: RE | Admit: 2015-11-29 | Discharge: 2015-11-29 | Disposition: A | Discharge status: Home / Self Care | Payer: Medicare Other | Source: Ambulatory Visit | Attending: General Surgery | Admitting: General Surgery

## 2015-11-29 DIAGNOSIS — K5732 Diverticulitis of large intestine without perforation or abscess without bleeding: Secondary | ICD-10-CM

## 2015-11-29 DIAGNOSIS — G43909 Migraine, unspecified, not intractable, without status migrainosus: Secondary | ICD-10-CM | POA: Diagnosis not present

## 2015-11-29 DIAGNOSIS — K5700 Diverticulitis of small intestine with perforation and abscess without bleeding: Secondary | ICD-10-CM | POA: Diagnosis not present

## 2015-11-30 NOTE — Telephone Encounter (Signed)
Records reviewed & Declined. Called pt to let her know but there was no answer. Left her a voicemail to return our call. Records placed in the [facility] file drawer.

## 2016-01-15 ENCOUNTER — Ambulatory Visit (INDEPENDENT_AMBULATORY_CARE_PROVIDER_SITE_OTHER): Payer: Medicare Other | Admitting: Gynecology

## 2016-01-15 ENCOUNTER — Encounter: Payer: Self-pay | Admitting: Gynecology

## 2016-01-15 VITALS — BP 118/76 | Ht 68.0 in | Wt 170.0 lb

## 2016-01-15 DIAGNOSIS — Z01419 Encounter for gynecological examination (general) (routine) without abnormal findings: Secondary | ICD-10-CM | POA: Diagnosis not present

## 2016-01-15 DIAGNOSIS — N952 Postmenopausal atrophic vaginitis: Secondary | ICD-10-CM

## 2016-01-15 DIAGNOSIS — M8580 Other specified disorders of bone density and structure, unspecified site: Secondary | ICD-10-CM | POA: Diagnosis not present

## 2016-01-15 DIAGNOSIS — K602 Anal fissure, unspecified: Secondary | ICD-10-CM

## 2016-01-15 MED ORDER — LIDOCAINE-HYDROCORTISONE ACE 3-0.5 % RE CREA: 1.0000 | TOPICAL_CREAM | Freq: Two times a day (BID) | RECTAL | Status: DC

## 2016-01-15 NOTE — Progress Notes (Signed)
Kaitlin Mar 30, 1946 [id number]        69 y.o.  G2P2  for breast and pelvic exam.  Past medical history,surgical history, problem list, medications, allergies, family history and social history were all reviewed and documented as reviewed in the EPIC chart.  ROS:  Performed with pertinent positives and negatives included in the history, assessment and plan.   Additional significant findings :  none   Exam: Kaitlin May assistant Filed Vitals:   01/15/16 0945  BP: 118/76  Height: 5\' 8"  (1.727 m)  Weight: 170 lb (77.111 kg)   General appearance:  Normal affect, orientation and appearance. Skin: Grossly normal HEENT: Without gross lesions.  No cervical or supraclavicular adenopathy. Thyroid normal.  Lungs:  Clear without wheezing, rales or rhonchi Cardiac: RR, without RMG Abdominal:  Soft, nontender, without masses, guarding, rebound, organomegaly or hernia Breasts:  Examined lying and sitting without masses, retractions, discharge or axillary adenopathy. Pelvic:  Ext/BUS/vagina with atrophic changes  Adnexa without masses or tenderness    Anus and perineum classic small fissure 9:00 position.  Rectovaginal normal sphincter tone without palpated masses or tenderness.    Assessment/Plan:  70 y.o. G2P2 female for breast and pelvic exam.   1. Postmenopausal/atrophic genital changes. History of TAH, LSO for irregular bleeding and questionable hyperplasia. Doing well without significant hot flashes, night sweats or vaginal dryness. 2. Anal bleeding with discomfort.  With classic anal fissure. Recommend AnaMantle HC twice daily. Will follow up with her gastroenterologist if continues. 3. Osteopenia. DEXA 11/2014.  T score -2.1. Being followed by her primary physician for this.  Continue to follow up with them. 4. Pap smear 2016. No Pap smear done today. No history of significant abnormal Pap smears. 5. Mammography 10/2012. Patient knows that she is overdue and I strongly recommended  she schedule screening mammogram and she agrees to do so. SBE monthly reviewed. 6. Colonoscopy 2016. Actively being followed for recurrent bouts of diverticulitis. We'll continue to follow up with them in reference to this. 7. Health maintenance. No routine lab work done as patient reports this done at her primary physician's office. Follow up 1 year, sooner as needed.   [name] MD, 10:08 AM 01/15/2016

## 2016-01-15 NOTE — Patient Instructions (Signed)
Schedule your mammogram Use the prescribed cream on the anal fissure twice daily.  Follow up with your gastroenterologist if it continues to be a problem.

## 2016-01-22 ENCOUNTER — Other Ambulatory Visit: Payer: Self-pay

## 2016-01-22 DIAGNOSIS — Z1231 Encounter for screening mammogram for malignant neoplasm of breast: Secondary | ICD-10-CM

## 2016-01-31 DIAGNOSIS — F419 Anxiety disorder, unspecified: Secondary | ICD-10-CM | POA: Diagnosis not present

## 2016-01-31 DIAGNOSIS — R109 Unspecified abdominal pain: Secondary | ICD-10-CM | POA: Diagnosis not present

## 2016-01-31 DIAGNOSIS — Z789 Other specified health status: Secondary | ICD-10-CM | POA: Diagnosis not present

## 2016-01-31 DIAGNOSIS — I10 Essential (primary) hypertension: Secondary | ICD-10-CM | POA: Diagnosis not present

## 2016-01-31 DIAGNOSIS — G43909 Migraine, unspecified, not intractable, without status migrainosus: Secondary | ICD-10-CM | POA: Diagnosis not present

## 2016-02-09 ENCOUNTER — Ambulatory Visit: Payer: Medicare Other

## 2016-02-19 ENCOUNTER — Ambulatory Visit
Admission: RE | Admit: 2016-02-19 | Discharge: 2016-02-19 | Disposition: A | Discharge status: Home / Self Care | Payer: Medicare Other | Source: Ambulatory Visit

## 2016-02-19 DIAGNOSIS — Z1231 Encounter for screening mammogram for malignant neoplasm of breast: Secondary | ICD-10-CM | POA: Diagnosis not present

## 2016-02-26 DIAGNOSIS — Z713 Dietary counseling and surveillance: Secondary | ICD-10-CM | POA: Diagnosis not present

## 2016-02-26 DIAGNOSIS — Z6826 Body mass index (BMI) 26.0-26.9, adult: Secondary | ICD-10-CM | POA: Diagnosis not present

## 2016-02-26 DIAGNOSIS — R110 Nausea: Secondary | ICD-10-CM | POA: Diagnosis not present

## 2016-03-08 DIAGNOSIS — R109 Unspecified abdominal pain: Secondary | ICD-10-CM | POA: Diagnosis not present

## 2016-03-08 DIAGNOSIS — K589 Irritable bowel syndrome without diarrhea: Secondary | ICD-10-CM | POA: Diagnosis not present

## 2016-03-08 DIAGNOSIS — R110 Nausea: Secondary | ICD-10-CM | POA: Diagnosis not present

## 2016-03-11 DIAGNOSIS — Z888 Allergy status to other drugs, medicaments and biological substances status: Secondary | ICD-10-CM | POA: Diagnosis not present

## 2016-03-11 DIAGNOSIS — Z823 Family history of stroke: Secondary | ICD-10-CM | POA: Diagnosis not present

## 2016-03-11 DIAGNOSIS — K589 Irritable bowel syndrome without diarrhea: Secondary | ICD-10-CM | POA: Diagnosis not present

## 2016-03-11 DIAGNOSIS — Z808 Family history of malignant neoplasm of other organs or systems: Secondary | ICD-10-CM | POA: Diagnosis not present

## 2016-03-11 DIAGNOSIS — K5732 Diverticulitis of large intestine without perforation or abscess without bleeding: Secondary | ICD-10-CM | POA: Diagnosis present

## 2016-03-11 DIAGNOSIS — Z452 Encounter for adjustment and management of vascular access device: Secondary | ICD-10-CM | POA: Diagnosis not present

## 2016-03-11 DIAGNOSIS — F419 Anxiety disorder, unspecified: Secondary | ICD-10-CM | POA: Diagnosis present

## 2016-03-11 DIAGNOSIS — Z818 Family history of other mental and behavioral disorders: Secondary | ICD-10-CM | POA: Diagnosis not present

## 2016-03-11 DIAGNOSIS — E7800 Pure hypercholesterolemia, unspecified: Secondary | ICD-10-CM | POA: Diagnosis present

## 2016-03-11 DIAGNOSIS — Z833 Family history of diabetes mellitus: Secondary | ICD-10-CM | POA: Diagnosis not present

## 2016-03-11 DIAGNOSIS — K5792 Diverticulitis of intestine, part unspecified, without perforation or abscess without bleeding: Secondary | ICD-10-CM | POA: Diagnosis not present

## 2016-03-11 DIAGNOSIS — Z79899 Other long term (current) drug therapy: Secondary | ICD-10-CM | POA: Diagnosis not present

## 2016-03-15 DIAGNOSIS — K5792 Diverticulitis of intestine, part unspecified, without perforation or abscess without bleeding: Secondary | ICD-10-CM | POA: Diagnosis not present

## 2016-03-16 DIAGNOSIS — K5792 Diverticulitis of intestine, part unspecified, without perforation or abscess without bleeding: Secondary | ICD-10-CM | POA: Diagnosis not present

## 2016-03-17 DIAGNOSIS — K5792 Diverticulitis of intestine, part unspecified, without perforation or abscess without bleeding: Secondary | ICD-10-CM | POA: Diagnosis not present

## 2016-03-18 DIAGNOSIS — K5792 Diverticulitis of intestine, part unspecified, without perforation or abscess without bleeding: Secondary | ICD-10-CM | POA: Diagnosis not present

## 2016-03-19 DIAGNOSIS — K5792 Diverticulitis of intestine, part unspecified, without perforation or abscess without bleeding: Secondary | ICD-10-CM | POA: Diagnosis not present

## 2016-03-20 DIAGNOSIS — K5792 Diverticulitis of intestine, part unspecified, without perforation or abscess without bleeding: Secondary | ICD-10-CM | POA: Diagnosis not present

## 2016-03-21 DIAGNOSIS — Z299 Encounter for prophylactic measures, unspecified: Secondary | ICD-10-CM | POA: Diagnosis not present

## 2016-03-21 DIAGNOSIS — K5792 Diverticulitis of intestine, part unspecified, without perforation or abscess without bleeding: Secondary | ICD-10-CM | POA: Diagnosis not present

## 2016-03-21 DIAGNOSIS — K5732 Diverticulitis of large intestine without perforation or abscess without bleeding: Secondary | ICD-10-CM | POA: Diagnosis not present

## 2016-03-21 DIAGNOSIS — R110 Nausea: Secondary | ICD-10-CM | POA: Diagnosis not present

## 2016-03-21 DIAGNOSIS — R51 Headache: Secondary | ICD-10-CM | POA: Diagnosis not present

## 2016-03-21 DIAGNOSIS — R109 Unspecified abdominal pain: Secondary | ICD-10-CM | POA: Diagnosis not present

## 2016-03-22 DIAGNOSIS — K5792 Diverticulitis of intestine, part unspecified, without perforation or abscess without bleeding: Secondary | ICD-10-CM | POA: Diagnosis not present

## 2016-03-23 DIAGNOSIS — K5792 Diverticulitis of intestine, part unspecified, without perforation or abscess without bleeding: Secondary | ICD-10-CM | POA: Diagnosis not present

## 2016-03-24 DIAGNOSIS — K5792 Diverticulitis of intestine, part unspecified, without perforation or abscess without bleeding: Secondary | ICD-10-CM | POA: Diagnosis not present

## 2016-03-25 DIAGNOSIS — R1011 Right upper quadrant pain: Secondary | ICD-10-CM | POA: Diagnosis not present

## 2016-03-25 DIAGNOSIS — K5792 Diverticulitis of intestine, part unspecified, without perforation or abscess without bleeding: Secondary | ICD-10-CM | POA: Diagnosis not present

## 2016-03-26 DIAGNOSIS — R109 Unspecified abdominal pain: Secondary | ICD-10-CM | POA: Diagnosis not present

## 2016-03-26 DIAGNOSIS — K5732 Diverticulitis of large intestine without perforation or abscess without bleeding: Secondary | ICD-10-CM | POA: Diagnosis not present

## 2016-03-26 DIAGNOSIS — K5792 Diverticulitis of intestine, part unspecified, without perforation or abscess without bleeding: Secondary | ICD-10-CM | POA: Diagnosis not present

## 2016-03-27 DIAGNOSIS — K5792 Diverticulitis of intestine, part unspecified, without perforation or abscess without bleeding: Secondary | ICD-10-CM | POA: Diagnosis not present

## 2016-04-05 DIAGNOSIS — R110 Nausea: Secondary | ICD-10-CM | POA: Diagnosis not present

## 2016-04-05 DIAGNOSIS — R109 Unspecified abdominal pain: Secondary | ICD-10-CM | POA: Diagnosis not present

## 2016-04-05 DIAGNOSIS — K589 Irritable bowel syndrome without diarrhea: Secondary | ICD-10-CM | POA: Diagnosis not present

## 2016-04-16 ENCOUNTER — Other Ambulatory Visit: Payer: Self-pay | Admitting: General Surgery

## 2016-04-16 DIAGNOSIS — K5732 Diverticulitis of large intestine without perforation or abscess without bleeding: Secondary | ICD-10-CM | POA: Diagnosis not present

## 2016-04-16 DIAGNOSIS — K601 Chronic anal fissure: Secondary | ICD-10-CM | POA: Diagnosis not present

## 2016-04-22 DIAGNOSIS — I10 Essential (primary) hypertension: Secondary | ICD-10-CM | POA: Diagnosis not present

## 2016-04-22 DIAGNOSIS — G43909 Migraine, unspecified, not intractable, without status migrainosus: Secondary | ICD-10-CM | POA: Diagnosis not present

## 2016-04-22 DIAGNOSIS — R109 Unspecified abdominal pain: Secondary | ICD-10-CM | POA: Diagnosis not present

## 2016-04-22 DIAGNOSIS — R110 Nausea: Secondary | ICD-10-CM | POA: Diagnosis not present

## 2016-04-26 DIAGNOSIS — R1084 Generalized abdominal pain: Secondary | ICD-10-CM | POA: Diagnosis not present

## 2016-04-26 DIAGNOSIS — E7800 Pure hypercholesterolemia, unspecified: Secondary | ICD-10-CM | POA: Diagnosis present

## 2016-04-26 DIAGNOSIS — Z833 Family history of diabetes mellitus: Secondary | ICD-10-CM | POA: Diagnosis not present

## 2016-04-26 DIAGNOSIS — Z823 Family history of stroke: Secondary | ICD-10-CM | POA: Diagnosis not present

## 2016-04-26 DIAGNOSIS — Z79899 Other long term (current) drug therapy: Secondary | ICD-10-CM | POA: Diagnosis not present

## 2016-04-26 DIAGNOSIS — K5730 Diverticulosis of large intestine without perforation or abscess without bleeding: Secondary | ICD-10-CM | POA: Diagnosis not present

## 2016-04-26 DIAGNOSIS — F419 Anxiety disorder, unspecified: Secondary | ICD-10-CM | POA: Diagnosis present

## 2016-04-26 DIAGNOSIS — Z79891 Long term (current) use of opiate analgesic: Secondary | ICD-10-CM | POA: Diagnosis not present

## 2016-04-26 DIAGNOSIS — Z808 Family history of malignant neoplasm of other organs or systems: Secondary | ICD-10-CM | POA: Diagnosis not present

## 2016-04-26 DIAGNOSIS — Z888 Allergy status to other drugs, medicaments and biological substances status: Secondary | ICD-10-CM | POA: Diagnosis not present

## 2016-04-26 DIAGNOSIS — E785 Hyperlipidemia, unspecified: Secondary | ICD-10-CM | POA: Diagnosis present

## 2016-04-26 DIAGNOSIS — K589 Irritable bowel syndrome without diarrhea: Secondary | ICD-10-CM | POA: Diagnosis not present

## 2016-04-26 DIAGNOSIS — R51 Headache: Secondary | ICD-10-CM | POA: Diagnosis present

## 2016-04-26 DIAGNOSIS — Z818 Family history of other mental and behavioral disorders: Secondary | ICD-10-CM | POA: Diagnosis not present

## 2016-04-30 DIAGNOSIS — R1084 Generalized abdominal pain: Secondary | ICD-10-CM | POA: Diagnosis not present

## 2016-05-02 DIAGNOSIS — G43909 Migraine, unspecified, not intractable, without status migrainosus: Secondary | ICD-10-CM | POA: Diagnosis not present

## 2016-05-02 DIAGNOSIS — R109 Unspecified abdominal pain: Secondary | ICD-10-CM | POA: Diagnosis not present

## 2016-05-02 DIAGNOSIS — Z299 Encounter for prophylactic measures, unspecified: Secondary | ICD-10-CM | POA: Diagnosis not present

## 2016-05-02 DIAGNOSIS — Z09 Encounter for follow-up examination after completed treatment for conditions other than malignant neoplasm: Secondary | ICD-10-CM | POA: Diagnosis not present

## 2016-05-07 ENCOUNTER — Encounter (HOSPITAL_COMMUNITY)
Admission: RE | Admit: 2016-05-07 | Discharge: 2016-05-07 | Disposition: A | Discharge status: Home / Self Care | Payer: Medicare Other | Source: Ambulatory Visit | Attending: General Surgery | Admitting: General Surgery

## 2016-05-07 NOTE — Progress Notes (Signed)
Did not precede with PAT appt; pt is now admit before surgery. Pt is aware.

## 2016-05-09 ENCOUNTER — Encounter (HOSPITAL_COMMUNITY): Payer: Self-pay

## 2016-05-09 ENCOUNTER — Inpatient Hospital Stay (HOSPITAL_COMMUNITY)
Admission: RE | Admit: 2016-05-09 | Discharge: 2016-05-15 | DRG: 331 | Disposition: A | Discharge status: Home / Self Care | Payer: Medicare Other | Source: Ambulatory Visit | Attending: General Surgery | Admitting: General Surgery

## 2016-05-09 DIAGNOSIS — K5732 Diverticulitis of large intestine without perforation or abscess without bleeding: Principal | ICD-10-CM | POA: Diagnosis present

## 2016-05-09 DIAGNOSIS — Z90710 Acquired absence of both cervix and uterus: Secondary | ICD-10-CM | POA: Diagnosis not present

## 2016-05-09 DIAGNOSIS — Z86010 Personal history of colonic polyps: Secondary | ICD-10-CM

## 2016-05-09 DIAGNOSIS — F329 Major depressive disorder, single episode, unspecified: Secondary | ICD-10-CM | POA: Diagnosis present

## 2016-05-09 DIAGNOSIS — K660 Peritoneal adhesions (postprocedural) (postinfection): Secondary | ICD-10-CM | POA: Diagnosis not present

## 2016-05-09 DIAGNOSIS — I10 Essential (primary) hypertension: Secondary | ICD-10-CM | POA: Diagnosis present

## 2016-05-09 DIAGNOSIS — F419 Anxiety disorder, unspecified: Secondary | ICD-10-CM | POA: Diagnosis not present

## 2016-05-09 DIAGNOSIS — Z01812 Encounter for preprocedural laboratory examination: Secondary | ICD-10-CM

## 2016-05-09 DIAGNOSIS — E7800 Pure hypercholesterolemia, unspecified: Secondary | ICD-10-CM | POA: Diagnosis present

## 2016-05-09 DIAGNOSIS — H5713 Ocular pain, bilateral: Secondary | ICD-10-CM | POA: Diagnosis not present

## 2016-05-09 DIAGNOSIS — F411 Generalized anxiety disorder: Secondary | ICD-10-CM | POA: Diagnosis not present

## 2016-05-09 DIAGNOSIS — K5909 Other constipation: Secondary | ICD-10-CM | POA: Diagnosis present

## 2016-05-09 DIAGNOSIS — Z8249 Family history of ischemic heart disease and other diseases of the circulatory system: Secondary | ICD-10-CM

## 2016-05-09 DIAGNOSIS — Z833 Family history of diabetes mellitus: Secondary | ICD-10-CM | POA: Diagnosis not present

## 2016-05-09 DIAGNOSIS — K5792 Diverticulitis of intestine, part unspecified, without perforation or abscess without bleeding: Secondary | ICD-10-CM | POA: Diagnosis not present

## 2016-05-09 DIAGNOSIS — K449 Diaphragmatic hernia without obstruction or gangrene: Secondary | ICD-10-CM | POA: Diagnosis not present

## 2016-05-09 DIAGNOSIS — K219 Gastro-esophageal reflux disease without esophagitis: Secondary | ICD-10-CM | POA: Diagnosis present

## 2016-05-09 LAB — CBC
HCT: 37.7 % (ref 36.0–46.0)
HEMOGLOBIN: 12.6 g/dL (ref 12.0–15.0)
MCH: 29.7 pg (ref 26.0–34.0)
MCHC: 33.4 g/dL (ref 30.0–36.0)
MCV: 88.9 fL (ref 78.0–100.0)
PLATELETS: 245 10*3/uL (ref 150–400)
RBC: 4.24 MIL/uL (ref 3.87–5.11)
RDW: 13.2 % (ref 11.5–15.5)
WBC: 5.5 10*3/uL (ref 4.0–10.5)

## 2016-05-09 LAB — URINALYSIS, DIPSTICK ONLY
Bilirubin Urine: NEGATIVE
GLUCOSE, UA: NEGATIVE mg/dL
HGB URINE DIPSTICK: NEGATIVE
KETONES UR: NEGATIVE mg/dL
LEUKOCYTES UA: NEGATIVE
Nitrite: NEGATIVE
PROTEIN: NEGATIVE mg/dL
Specific Gravity, Urine: 1.012 (ref 1.005–1.030)
pH: 7 (ref 5.0–8.0)

## 2016-05-09 LAB — BASIC METABOLIC PANEL
ANION GAP: 6 (ref 5–15)
BUN: 14 mg/dL (ref 6–20)
CHLORIDE: 105 mmol/L (ref 101–111)
CO2: 30 mmol/L (ref 22–32)
CREATININE: 0.89 mg/dL (ref 0.44–1.00)
Calcium: 9.2 mg/dL (ref 8.9–10.3)
GFR calc non Af Amer: >60 mL/min (ref >60)
Glucose, Bld: 102 mg/dL — ABNORMAL HIGH (ref 65–99)
POTASSIUM: 4.1 mmol/L (ref 3.5–5.1)
SODIUM: 141 mmol/L (ref 135–145)

## 2016-05-09 LAB — SURGICAL PCR SCREEN
MRSA, PCR: NEGATIVE
STAPHYLOCOCCUS AUREUS: NEGATIVE

## 2016-05-09 MED ORDER — MORPHINE SULFATE (PF) 2 MG/ML IV SOLN
2.0000 mg | INTRAVENOUS | Status: DC | PRN
  Administered 2016-05-09 – 2016-05-10 (×3): 2 mg via INTRAVENOUS
  Filled 2016-05-09 (×3): qty 1

## 2016-05-09 MED ORDER — DEXTROSE 5 % IV SOLN
2.0000 g | INTRAVENOUS | Status: AC
  Administered 2016-05-10: 2 g via INTRAVENOUS

## 2016-05-09 MED ORDER — PROMETHAZINE HCL 25 MG/ML IJ SOLN
12.5000 mg | Freq: Four times a day (QID) | INTRAMUSCULAR | Status: DC | PRN
  Administered 2016-05-09 – 2016-05-14 (×12): 12.5 mg via INTRAVENOUS
  Filled 2016-05-09 (×14): qty 1

## 2016-05-09 MED ORDER — POTASSIUM CHLORIDE IN NACL 20-0.9 MEQ/L-% IV SOLN
INTRAVENOUS | Status: DC
  Administered 2016-05-09: 75 mL/h via INTRAVENOUS
  Administered 2016-05-10: via INTRAVENOUS
  Filled 2016-05-09 (×3): qty 1000

## 2016-05-09 MED ORDER — NEOMYCIN SULFATE 500 MG PO TABS
1000.0000 mg | ORAL_TABLET | ORAL | Status: AC
  Administered 2016-05-09 (×3): 1000 mg via ORAL
  Filled 2016-05-09 (×3): qty 2

## 2016-05-09 MED ORDER — ONDANSETRON HCL 4 MG/2ML IJ SOLN
4.0000 mg | Freq: Four times a day (QID) | INTRAMUSCULAR | Status: DC | PRN
  Administered 2016-05-09 – 2016-05-10 (×6): 4 mg via INTRAVENOUS
  Filled 2016-05-09 (×5): qty 2

## 2016-05-09 MED ORDER — POLYETHYLENE GLYCOL 3350 17 GM/SCOOP PO POWD
1.0000 | Freq: Once | ORAL | Status: AC
  Administered 2016-05-09: 255 g via ORAL
  Filled 2016-05-09: qty 255

## 2016-05-09 MED ORDER — METRONIDAZOLE 500 MG PO TABS: 1000.0000 mg | ORAL_TABLET | ORAL | Status: DC

## 2016-05-09 MED ORDER — CHLORHEXIDINE GLUCONATE CLOTH 2 % EX PADS: 6.0000 | MEDICATED_PAD | Freq: Once | CUTANEOUS | Status: DC

## 2016-05-09 MED ORDER — BISACODYL 5 MG PO TBEC
20.0000 mg | DELAYED_RELEASE_TABLET | Freq: Once | ORAL | Status: AC
  Administered 2016-05-09: 20 mg via ORAL
  Filled 2016-05-09: qty 4

## 2016-05-09 MED ORDER — HEPARIN SODIUM (PORCINE) 5000 UNIT/ML IJ SOLN
5000.0000 [IU] | INTRAMUSCULAR | Status: AC
  Administered 2016-05-10: 5000 [IU] via SUBCUTANEOUS
  Filled 2016-05-09: qty 1

## 2016-05-09 MED ORDER — HYDROCODONE-ACETAMINOPHEN 5-325 MG PO TABS
1.0000 | ORAL_TABLET | ORAL | Status: DC | PRN
  Administered 2016-05-09 – 2016-05-15 (×9): 2 via ORAL
  Filled 2016-05-09 (×2): qty 2
  Filled 2016-05-09: qty 1
  Filled 2016-05-09 (×7): qty 2

## 2016-05-09 MED ORDER — ONDANSETRON 4 MG PO TBDP: 4.0000 mg | ORAL_TABLET | Freq: Four times a day (QID) | ORAL | Status: DC | PRN

## 2016-05-09 MED ORDER — FAMOTIDINE IN NACL 20-0.9 MG/50ML-% IV SOLN
20.0000 mg | Freq: Two times a day (BID) | INTRAVENOUS | Status: DC
  Administered 2016-05-09 – 2016-05-12 (×7): 20 mg via INTRAVENOUS
  Filled 2016-05-09 (×9): qty 50

## 2016-05-09 MED ORDER — NEOMYCIN SULFATE 500 MG PO TABS: 1000.0000 mg | ORAL_TABLET | Freq: Three times a day (TID) | ORAL | Status: DC

## 2016-05-09 MED ORDER — METRONIDAZOLE 500 MG PO TABS
1000.0000 mg | ORAL_TABLET | ORAL | Status: AC
  Administered 2016-05-09 (×3): 1000 mg via ORAL
  Filled 2016-05-09 (×3): qty 2

## 2016-05-09 MED ORDER — LORAZEPAM 1 MG PO TABS
1.0000 mg | ORAL_TABLET | Freq: Three times a day (TID) | ORAL | Status: DC | PRN
  Administered 2016-05-09: 1 mg via ORAL
  Filled 2016-05-09: qty 1

## 2016-05-09 MED ORDER — BUTALBITAL-APAP-CAFFEINE 50-325-40 MG PO TABS: 2.0000 | ORAL_TABLET | Freq: Four times a day (QID) | ORAL | Status: DC | PRN

## 2016-05-09 MED ORDER — ALVIMOPAN 12 MG PO CAPS
12.0000 mg | ORAL_CAPSULE | ORAL | Status: AC
  Administered 2016-05-10: 12 mg via ORAL
  Filled 2016-05-09: qty 1

## 2016-05-09 NOTE — Progress Notes (Signed)
Anesthesia notified of patients admission and room number

## 2016-05-09 NOTE — H&P (Signed)
History of Present Illness  Patient words: Diverticulitis.  The patient is a 70 year old female who presents with diverticulitis. This is a 70 year old Caucasian female who returns for follow-up for recurrent abdominal pain secondary to presumed diverticulitis. She is accompanied by her husband and sister. She gives a long history of abdominal pain dating back a number of years with history of irritable bowel syndrome but marked worsening of her symptoms since last summer. She describes multiple episodes of fairly severe abdominal pain which have required hospitalization and IV antibiotics on 5 occasions since August of last year. She has seen Dr. Dalbert Batman on 2 occasions to discuss possible surgery for sigmoid diverticulitis. Since her last visit she was again hospitalized in April of this year and completed another course of antibiotics a little over 3 weeks ago. She states that she gets significantly better with treatment but never really gets completely well and then will be back in the hospital on antibiotics. She has had multiple CT scans since last year.  I have reviewed her CT scans. September 12, 2014 shows no explanation for the patient's abdominal pain. CT July 16, [id number] shows uncomplicated diverticulitis of the lower sigmoid colon. CT of July 04, 2015 shows no acute abnormality with resolution of inflammatory changes. CT of August 25, 2015 shows wall thickening and pericolonic fat stranding and sigmoid colon compatible with acute uncomplicated sigmoid diverticulitis. CT scan October 10, 2015 shows focal sigmoid diverticulitis at the rectosigmoid junction but no complication. CT scan on October 20, 2015 shows focal sigmoid diverticulitis without complication. April 2017 CT shows sigmoid wall thickening and multiple diverticula consistent with diverticulitis  On July 13, 2015 she underwent upper endoscopy by Dr. Buford Dresser revealing Schatzki's ring and hiatal hernia. She had  diverticulosis, no cancer, no inflammatory changes, no apparent stricture. She was evaluated in July by Dr. Arsenio Loader at [facility]. Please see his detailed note. He thought she had diverticulosis, irritable bowel syndrome, constipation chronic anxiety and she was referred back to her primary physicians.  She has chronic constipation for which she takes milk of magnesia. She is also treated for anal fissure. This currently is causing some pain and occasional bleeding.  Since she saw Dr. Dalbert Batman last time she has had a barium enema on November 29, 2015 showing colonic diverticulosis clustered mostly at the sigmoid colon with scattered diverticula throughout the remainder of the colon. The sigmoid had limited distensibility but no stricture.   Problem List/Past Medical  ANXIETY AND DEPRESSION (F41.9, F32.9) HISTORY OF MIGRAINE HEADACHES (Z86.69) DIVERTICULITIS, COLON (K57.32) HISTORY OF HYSTERECTOMY (Z90.710)  Other Problems  Ulcerative Colitis Other disease, cancer, significant illness Hemorrhoids Gastroesophageal Reflux Disease Hypercholesterolemia Oophorectomy Left. Migraine Headache Diverticulosis Anxiety Disorder Back Pain Chest pain Bladder Problems  Past Surgical History  Oral Surgery Hysterectomy (not due to cancer) - Partial Tonsillectomy Colon Polyp Removal - Colonoscopy Appendectomy  Diagnostic Studies History  Mammogram 1-3 years ago Colonoscopy within last year Pap Smear 1-5 years ago  Allergies  PredniSONE *CORTICOSTEROIDS* Dilaudid *ANALGESICS - OPIOID*  Medication History  LORazepam (1MG  Tablet, Oral) Active. Butalbital-APAP-Caffeine (50-325-40MG  Tablet, Oral) Active. Dicyclomine HCl (10MG  Capsule, Oral) Active. Linzess (145MCG Capsule, Oral) Active. Ondansetron HCl (4MG  Tablet, Oral) Active. Phenadoz (25MG  Suppository, Rectal) Active. Promethazine HCl (25MG  Tablet, Oral) Active. Prevacid (30MG  Capsule DR, Oral)  Active. Medications Reconciled  Social History  Caffeine use Carbonated beverages, Coffee, Tea. Tobacco use Never smoker. No alcohol use  Family History  Cerebrovascular Accident Mother. Arthritis Daughter, Mother, Sister. Colon Polyps Brother. Migraine  Headache Mother, Sister, Pandora Leiter. Heart Disease Brother. Diabetes Mellitus Brother, Daughter, Mother. Hypertension Brother, Daughter, Mother. Melanoma Father. Ischemic Bowel Disease Sister.  Pregnancy / Birth History  Age at menarche 18 years. Gravida 2 Age of menopause <43 Maternal age 69-20 Para 2     Physical Exam  General: Alert, somewhat anxious Caucasian female, in no distress Skin: Warm and dry without rash or infection. HEENT: No palpable masses or thyromegaly. Sclera nonicteric. Pupils equal round and reactive. Lymph nodes: No cervical, supraclavicular, or inguinal nodes palpable. Lungs: Breath sounds clear and equal. No wheezing or increased work of breathing. Cardiovascular: Regular rate and rhythm without murmer. No JVD or edema. Abdomen: Nondistended. Mild diffuse tenderness but more significant tenderness across the lower abdomen with minimal guarding and no palpable masses. No hernias. Extremities: No edema or joint swelling or deformity. No chronic venous stasis changes. Superficial varicosities. Neurologic: Alert and fully oriented. Gait normal. No focal weakness. Psychiatric: Somewhat anxious but appropriate. Thought content appropriate with normal judgement and insight    Assessment & Plan  DIVERTICULITIS, COLON (K57.32) Impression: She has persistent and worsening episodic abdominal pain since last summer with multiple CT scans documenting sigmoid diverticulitis. She has had an extensive GI workup in addition to this with no other source found. Certainly some of her problems may be functional but she has clear evidence of ongoing diverticulitis in many of her symptoms are very consistent  with this. Had a long discussion with the patient and her sister regarding options. She really is just having back-to-back episodes for a number of months and I think she would be best served with elective sigmoid colectomy. We discussed options of continued medical management. After long discussion and answering her questions she would like to proceed with laparoscopic sigmoid colectomy. We discussed the procedure in detail and expected recovery as well as risks of anesthetic complications, bleeding, infection, anastomotic leak with slight chance of colostomy or even death. She was given literature regarding the procedure.    Pt Education - [facility] Colon Bowel Prep 2015 Miralax/Antibiotics Pt Education - Pamphlet Given - Laparoscopic Colorectal Surgery: discussed with patient and provided information. Pt Education - [facility] Colectomy post-op instructions: discussed with patient and provided information. She is admitted one day prior to planned laparoscopic sigmoid colectomy for her bowel prep due to previous intolerance of bowel prep secondary to nausea and vomiting.  Laparoscopic sigmoid colectomy   Edward Jolly MD, FACS  05/09/2016, 11:05 AM

## 2016-05-10 ENCOUNTER — Inpatient Hospital Stay (HOSPITAL_COMMUNITY): Payer: Medicare Other | Admitting: Registered Nurse

## 2016-05-10 ENCOUNTER — Encounter (HOSPITAL_COMMUNITY): Payer: Self-pay | Admitting: Registered Nurse

## 2016-05-10 ENCOUNTER — Encounter (HOSPITAL_COMMUNITY)
Admission: RE | Disposition: A | Discharge status: Home / Self Care | Payer: Self-pay | Source: Ambulatory Visit | Attending: General Surgery

## 2016-05-10 HISTORY — PX: LAPAROSCOPIC SIGMOID COLECTOMY: SHX5928

## 2016-05-10 LAB — TYPE AND SCREEN
ABO/RH(D): A POS
ANTIBODY SCREEN: NEGATIVE

## 2016-05-10 LAB — HEMOGLOBIN A1C
HEMOGLOBIN A1C: 5.4 % (ref 4.8–5.6)
MEAN PLASMA GLUCOSE: 108 mg/dL

## 2016-05-10 LAB — ABO/RH: ABO/RH(D): A POS

## 2016-05-10 SURGERY — COLECTOMY, SIGMOID, LAPAROSCOPIC
Anesthesia: General

## 2016-05-10 MED ORDER — SODIUM CHLORIDE 0.9 % IJ SOLN
INTRAMUSCULAR | Status: DC | PRN
  Administered 2016-05-10: 60 mL

## 2016-05-10 MED ORDER — ONDANSETRON HCL 4 MG/2ML IJ SOLN
4.0000 mg | INTRAMUSCULAR | Status: DC | PRN
  Administered 2016-05-10 – 2016-05-14 (×11): 4 mg via INTRAVENOUS
  Filled 2016-05-10 (×10): qty 2

## 2016-05-10 MED ORDER — ONDANSETRON HCL 4 MG/2ML IJ SOLN: 4.0000 mg | Freq: Four times a day (QID) | INTRAMUSCULAR | Status: DC | PRN

## 2016-05-10 MED ORDER — ONDANSETRON HCL 4 MG/2ML IJ SOLN
INTRAMUSCULAR | Status: AC
  Filled 2016-05-10: qty 2

## 2016-05-10 MED ORDER — SUGAMMADEX SODIUM 200 MG/2ML IV SOLN
INTRAVENOUS | Status: DC | PRN
  Administered 2016-05-10: 200 mg via INTRAVENOUS

## 2016-05-10 MED ORDER — BUPIVACAINE-EPINEPHRINE 0.25% -1:200000 IJ SOLN
INTRAMUSCULAR | Status: AC
  Filled 2016-05-10: qty 1

## 2016-05-10 MED ORDER — LACTATED RINGERS IR SOLN
Status: DC | PRN
  Administered 2016-05-10: 1000 mL

## 2016-05-10 MED ORDER — FENTANYL CITRATE (PF) 100 MCG/2ML IJ SOLN
INTRAMUSCULAR | Status: AC
  Filled 2016-05-10: qty 2

## 2016-05-10 MED ORDER — KETOROLAC TROMETHAMINE 0.5 % OP SOLN
1.0000 [drp] | Freq: Three times a day (TID) | OPHTHALMIC | Status: AC | PRN
  Administered 2016-05-10 – 2016-05-11 (×3): 1 [drp] via OPHTHALMIC
  Filled 2016-05-10: qty 3

## 2016-05-10 MED ORDER — 0.9 % SODIUM CHLORIDE (POUR BTL) OPTIME
TOPICAL | Status: DC | PRN
  Administered 2016-05-10: 2000 mL

## 2016-05-10 MED ORDER — ROCURONIUM BROMIDE 100 MG/10ML IV SOLN
INTRAVENOUS | Status: DC | PRN
  Administered 2016-05-10: 20 mg via INTRAVENOUS
  Administered 2016-05-10 (×2): 10 mg via INTRAVENOUS
  Administered 2016-05-10: 60 mg via INTRAVENOUS

## 2016-05-10 MED ORDER — BUPIVACAINE-EPINEPHRINE 0.25% -1:200000 IJ SOLN
INTRAMUSCULAR | Status: DC | PRN
  Administered 2016-05-10: 20 mL

## 2016-05-10 MED ORDER — MORPHINE SULFATE (PF) 10 MG/ML IV SOLN: 4.0000 mg | Freq: Once | INTRAVENOUS | Status: DC

## 2016-05-10 MED ORDER — MORPHINE SULFATE (PF) 10 MG/ML IV SOLN
INTRAVENOUS | Status: AC
  Administered 2016-05-10: 4 mg
  Filled 2016-05-10: qty 1

## 2016-05-10 MED ORDER — POTASSIUM CHLORIDE IN NACL 20-0.9 MEQ/L-% IV SOLN
INTRAVENOUS | Status: DC
  Administered 2016-05-10 – 2016-05-12 (×5): via INTRAVENOUS
  Administered 2016-05-13: 100 mL/h via INTRAVENOUS
  Administered 2016-05-13 – 2016-05-14 (×2): 1000 mL via INTRAVENOUS
  Filled 2016-05-10 (×12): qty 1000

## 2016-05-10 MED ORDER — LIDOCAINE 2% (20 MG/ML) 5 ML SYRINGE
INTRAMUSCULAR | Status: DC | PRN
  Administered 2016-05-10: 50 mg via INTRAVENOUS

## 2016-05-10 MED ORDER — SODIUM CHLORIDE 0.9 % IJ SOLN
INTRAMUSCULAR | Status: AC
  Filled 2016-05-10: qty 10

## 2016-05-10 MED ORDER — ENOXAPARIN SODIUM 40 MG/0.4ML ~~LOC~~ SOLN
40.0000 mg | SUBCUTANEOUS | Status: DC
  Administered 2016-05-11 – 2016-05-14 (×4): 40 mg via SUBCUTANEOUS
  Filled 2016-05-10 (×4): qty 0.4

## 2016-05-10 MED ORDER — BELLADONNA ALKALOIDS-OPIUM 16.2-60 MG RE SUPP
RECTAL | Status: AC
Start: 2016-05-10 — End: 2016-05-10
  Filled 2016-05-10: qty 1

## 2016-05-10 MED ORDER — LACTATED RINGERS IV SOLN
INTRAVENOUS | Status: DC | PRN
  Administered 2016-05-10 (×2): via INTRAVENOUS

## 2016-05-10 MED ORDER — FENTANYL CITRATE (PF) 100 MCG/2ML IJ SOLN
25.0000 ug | INTRAMUSCULAR | Status: DC | PRN
  Administered 2016-05-10 (×3): 50 ug via INTRAVENOUS

## 2016-05-10 MED ORDER — PROMETHAZINE HCL 25 MG/ML IJ SOLN
12.5000 mg | Freq: Once | INTRAMUSCULAR | Status: AC
  Administered 2016-05-10: 12.5 mg via INTRAVENOUS

## 2016-05-10 MED ORDER — LIDOCAINE HCL (CARDIAC) 20 MG/ML IV SOLN
INTRAVENOUS | Status: AC
  Filled 2016-05-10: qty 5

## 2016-05-10 MED ORDER — PROPOFOL 10 MG/ML IV BOLUS
INTRAVENOUS | Status: DC | PRN
  Administered 2016-05-10: 150 mg via INTRAVENOUS

## 2016-05-10 MED ORDER — PROMETHAZINE HCL 25 MG/ML IJ SOLN
INTRAMUSCULAR | Status: AC
  Administered 2016-05-11: 12.5 mg via INTRAVENOUS
  Filled 2016-05-10: qty 1

## 2016-05-10 MED ORDER — MIDAZOLAM HCL 2 MG/2ML IJ SOLN
INTRAMUSCULAR | Status: AC
Start: 2016-05-10 — End: 2016-05-10
  Administered 2016-05-10: 1 mg via INTRAVENOUS
  Filled 2016-05-10: qty 2

## 2016-05-10 MED ORDER — BSS IO SOLN
15.0000 mL | Freq: Once | INTRAOCULAR | Status: AC
  Administered 2016-05-10: 15 mL
  Filled 2016-05-10: qty 15

## 2016-05-10 MED ORDER — FENTANYL CITRATE (PF) 250 MCG/5ML IJ SOLN
INTRAMUSCULAR | Status: AC
Start: 2016-05-10 — End: 2016-05-10
  Filled 2016-05-10: qty 5

## 2016-05-10 MED ORDER — SUCCINYLCHOLINE CHLORIDE 200 MG/10ML IV SOSY
PREFILLED_SYRINGE | INTRAVENOUS | Status: DC | PRN
  Administered 2016-05-10: 100 mg via INTRAVENOUS

## 2016-05-10 MED ORDER — SODIUM CHLORIDE 0.9 % IJ SOLN
INTRAMUSCULAR | Status: AC
  Filled 2016-05-10: qty 50

## 2016-05-10 MED ORDER — CEFOTETAN DISODIUM-DEXTROSE 2-2.08 GM-% IV SOLR
INTRAVENOUS | Status: AC
  Filled 2016-05-10: qty 50

## 2016-05-10 MED ORDER — BUPIVACAINE LIPOSOME 1.3 % IJ SUSP
20.0000 mL | Freq: Once | INTRAMUSCULAR | Status: AC
  Administered 2016-05-10: 20 mL
  Filled 2016-05-10: qty 20

## 2016-05-10 MED ORDER — MIDAZOLAM HCL 2 MG/2ML IJ SOLN
1.0000 mg | Freq: Once | INTRAMUSCULAR | Status: AC
  Administered 2016-05-10: 1 mg via INTRAVENOUS

## 2016-05-10 MED ORDER — OXYCODONE HCL 5 MG PO TABS: 5.0000 mg | ORAL_TABLET | Freq: Once | ORAL | Status: DC | PRN

## 2016-05-10 MED ORDER — BELLADONNA ALKALOIDS-OPIUM 16.2-60 MG RE SUPP
1.0000 | Freq: Once | RECTAL | Status: AC
Start: 2016-05-10 — End: 2016-05-10
  Administered 2016-05-10: 1 via RECTAL

## 2016-05-10 MED ORDER — LORAZEPAM 2 MG/ML IJ SOLN
1.0000 mg | Freq: Three times a day (TID) | INTRAMUSCULAR | Status: DC | PRN
  Administered 2016-05-10 – 2016-05-15 (×14): 1 mg via INTRAVENOUS
  Filled 2016-05-10 (×14): qty 1

## 2016-05-10 MED ORDER — ROCURONIUM BROMIDE 100 MG/10ML IV SOLN
INTRAVENOUS | Status: AC
  Filled 2016-05-10: qty 1

## 2016-05-10 MED ORDER — MORPHINE SULFATE (PF) 2 MG/ML IV SOLN
2.0000 mg | INTRAVENOUS | Status: DC | PRN
  Administered 2016-05-10 – 2016-05-13 (×21): 3 mg via INTRAVENOUS
  Administered 2016-05-13: 2 mg via INTRAVENOUS
  Administered 2016-05-13 (×3): 3 mg via INTRAVENOUS
  Administered 2016-05-14 – 2016-05-15 (×4): 2 mg via INTRAVENOUS
  Filled 2016-05-10 (×5): qty 2
  Filled 2016-05-10: qty 1
  Filled 2016-05-10 (×4): qty 2
  Filled 2016-05-10: qty 1
  Filled 2016-05-10 (×2): qty 2
  Filled 2016-05-10: qty 1
  Filled 2016-05-10 (×9): qty 2
  Filled 2016-05-10: qty 1
  Filled 2016-05-10 (×3): qty 2
  Filled 2016-05-10: qty 1
  Filled 2016-05-10: qty 2

## 2016-05-10 MED ORDER — ONDANSETRON 4 MG PO TBDP
4.0000 mg | ORAL_TABLET | ORAL | Status: DC | PRN
  Administered 2016-05-13 – 2016-05-15 (×6): 4 mg via ORAL
  Filled 2016-05-10 (×6): qty 1

## 2016-05-10 MED ORDER — PROPOFOL 10 MG/ML IV BOLUS
INTRAVENOUS | Status: AC
  Filled 2016-05-10: qty 20

## 2016-05-10 MED ORDER — FENTANYL CITRATE (PF) 100 MCG/2ML IJ SOLN
INTRAMUSCULAR | Status: DC | PRN
  Administered 2016-05-10: 100 ug via INTRAVENOUS
  Administered 2016-05-10: 25 ug via INTRAVENOUS
  Administered 2016-05-10 (×2): 50 ug via INTRAVENOUS
  Administered 2016-05-10: 25 ug via INTRAVENOUS

## 2016-05-10 MED ORDER — METOCLOPRAMIDE HCL 5 MG/ML IJ SOLN
10.0000 mg | Freq: Once | INTRAMUSCULAR | Status: AC
  Administered 2016-05-10: 10 mg via INTRAVENOUS
  Filled 2016-05-10: qty 2

## 2016-05-10 MED ORDER — LACTATED RINGERS IV SOLN
INTRAVENOUS | Status: DC
  Administered 2016-05-10: 14:00:00 via INTRAVENOUS

## 2016-05-10 MED ORDER — SUGAMMADEX SODIUM 200 MG/2ML IV SOLN
INTRAVENOUS | Status: AC
  Filled 2016-05-10: qty 2

## 2016-05-10 MED ORDER — DEXAMETHASONE SODIUM PHOSPHATE 10 MG/ML IJ SOLN
INTRAMUSCULAR | Status: AC
  Filled 2016-05-10: qty 1

## 2016-05-10 MED ORDER — OXYCODONE HCL 5 MG/5ML PO SOLN
5.0000 mg | Freq: Once | ORAL | Status: DC | PRN
Start: 2016-05-10 — End: 2016-05-10
  Filled 2016-05-10: qty 5

## 2016-05-10 MED ORDER — BELLADONNA ALKALOIDS-OPIUM 16.2-60 MG RE SUPP: 1.0000 | Freq: Every day | RECTAL | Status: DC

## 2016-05-10 SURGICAL SUPPLY — 66 items
APPLIER CLIP ROT 10 11.4 M/L (STAPLE)
BLADE EXTENDED COATED 6.5IN (ELECTRODE) IMPLANT
CABLE HIGH FREQUENCY MONO STRZ (ELECTRODE) ×3 IMPLANT
CELLS DAT CNTRL 66122 CELL SVR (MISCELLANEOUS) IMPLANT
CLIP APPLIE ROT 10 11.4 M/L (STAPLE) IMPLANT
CLOSURE WOUND 1/2 X4 (GAUZE/BANDAGES/DRESSINGS) ×1
COUNTER NEEDLE 20 DBL MAG RED (NEEDLE) IMPLANT
COVER MAYO STAND STRL (DRAPES) ×9 IMPLANT
COVER SURGICAL LIGHT HANDLE (MISCELLANEOUS) ×3 IMPLANT
DECANTER SPIKE VIAL GLASS SM (MISCELLANEOUS) ×3 IMPLANT
DRAIN CHANNEL 19F RND (DRAIN) IMPLANT
DRAPE LAPAROSCOPIC ABDOMINAL (DRAPES) ×3 IMPLANT
DRAPE SHEET LG 3/4 BI-LAMINATE (DRAPES) ×6 IMPLANT
DRAPE UTILITY XL STRL (DRAPES) ×3 IMPLANT
DRAPE WARM FLUID 44X44 (DRAPE) IMPLANT
DRSG OPSITE POSTOP 4X10 (GAUZE/BANDAGES/DRESSINGS) IMPLANT
DRSG OPSITE POSTOP 4X6 (GAUZE/BANDAGES/DRESSINGS) ×3 IMPLANT
DRSG OPSITE POSTOP 4X8 (GAUZE/BANDAGES/DRESSINGS) IMPLANT
ELECT PENCIL ROCKER SW 15FT (MISCELLANEOUS) ×6 IMPLANT
GAUZE SPONGE 4X4 12PLY STRL (GAUZE/BANDAGES/DRESSINGS) IMPLANT
GLOVE BIOGEL PI IND STRL 7.5 (GLOVE) ×2 IMPLANT
GLOVE BIOGEL PI INDICATOR 7.5 (GLOVE) ×4
GLOVE ECLIPSE 7.5 STRL STRAW (GLOVE) ×6 IMPLANT
GOWN STRL REUS W/TWL XL LVL3 (GOWN DISPOSABLE) ×24 IMPLANT
LEGGING LITHOTOMY PAIR STRL (DRAPES) ×3 IMPLANT
LIGASURE IMPACT 36 18CM CVD LR (INSTRUMENTS) IMPLANT
LIQUID BAND (GAUZE/BANDAGES/DRESSINGS) IMPLANT
PACK COLON (CUSTOM PROCEDURE TRAY) ×3 IMPLANT
PAD POSITIONING PINK XL (MISCELLANEOUS) ×3 IMPLANT
PORT LAP GEL ALEXIS MED 5-9CM (MISCELLANEOUS) IMPLANT
POSITIONER SURGICAL ARM (MISCELLANEOUS) IMPLANT
RELOAD STAPLER BLUE 60MM (STAPLE) ×2 IMPLANT
RELOAD STAPLER WHITE 60MM (STAPLE) ×1 IMPLANT
RTRCTR WOUND ALEXIS 18CM MED (MISCELLANEOUS)
SCISSORS LAP 5X35 DISP (ENDOMECHANICALS) ×3 IMPLANT
SET IRRIG TUBING LAPAROSCOPIC (IRRIGATION / IRRIGATOR) ×3 IMPLANT
SHEARS HARMONIC ACE PLUS 36CM (ENDOMECHANICALS) IMPLANT
SHEARS HARMONIC ACE PLUS 45CM (MISCELLANEOUS) ×3 IMPLANT
SLEEVE XCEL OPT CAN 5 100 (ENDOMECHANICALS) ×6 IMPLANT
STAPLER CIRC ILS CVD 25MM (STAPLE) ×3 IMPLANT
STAPLER RELOAD BLUE 60MM (STAPLE) ×6
STAPLER RELOAD WHITE 60MM (STAPLE) ×3
STAPLER VISISTAT 35W (STAPLE) IMPLANT
STRIP CLOSURE SKIN 1/2X4 (GAUZE/BANDAGES/DRESSINGS) ×2 IMPLANT
SUT PDS AB 0 CT1 36 (SUTURE) IMPLANT
SUT PDS AB 1 CTX 36 (SUTURE) IMPLANT
SUT PDS AB 1 TP1 96 (SUTURE) IMPLANT
SUT PROLENE 2 0 KS (SUTURE) ×3 IMPLANT
SUT PROLENE 3 0 SH 48 (SUTURE) IMPLANT
SUT SILK 2 0 (SUTURE) ×2
SUT SILK 2 0 SH CR/8 (SUTURE) ×3 IMPLANT
SUT SILK 2-0 18XBRD TIE 12 (SUTURE) ×1 IMPLANT
SUT SILK 3 0 (SUTURE) ×2
SUT SILK 3 0 SH CR/8 (SUTURE) ×3 IMPLANT
SUT SILK 3-0 18XBRD TIE 12 (SUTURE) ×1 IMPLANT
SYS LAPSCP GELPORT 120MM (MISCELLANEOUS) ×3
SYSTEM LAPSCP GELPORT 120MM (MISCELLANEOUS) ×1 IMPLANT
TAPE CLOTH 4X10 WHT NS (GAUZE/BANDAGES/DRESSINGS) IMPLANT
TOWEL OR NON WOVEN STRL DISP B (DISPOSABLE) ×6 IMPLANT
TRAY FOLEY W/METER SILVER 14FR (SET/KITS/TRAYS/PACK) ×3 IMPLANT
TRAY FOLEY W/METER SILVER 16FR (SET/KITS/TRAYS/PACK) IMPLANT
TROCAR BLADELESS OPT 5 100 (ENDOMECHANICALS) ×3 IMPLANT
TROCAR XCEL 12X100 BLDLESS (ENDOMECHANICALS) ×3 IMPLANT
TROCAR XCEL BLUNT TIP 100MML (ENDOMECHANICALS) ×3 IMPLANT
TROCAR XCEL NON-BLD 11X100MML (ENDOMECHANICALS) IMPLANT
TUBING INSUF HEATED (TUBING) ×3 IMPLANT

## 2016-05-10 NOTE — Progress Notes (Signed)
Day of Surgery  Subjective: Back to floor.  C/O bilateral eye pain and nausea.    Objective: Vital signs in last 24 hours: Temp:  [97.1 F (36.2 C)-98.1 F (36.7 C)] 97.8 F (36.6 C) (06/23 1527) Pulse Rate:  [51-105] 103 (06/23 1527) Resp:  [12-23] 18 (06/23 1527) BP: (97-185)/(63-92) 183/85 mmHg (06/23 1527) SpO2:  [96 %-100 %] 100 % (06/23 1527) Weight:  [74.662 kg (164 lb 9.6 oz)] 74.662 kg (164 lb 9.6 oz) (06/23 0659) Last BM Date: 05/10/16  Intake/Output from previous day: 06/22 0701 - 06/23 0700 In: 2066.3 [P.O.:960; I.V.:1106.3] Out: 300 [Urine:300] Intake/Output this shift: Total I/O In: 2000 [I.V.:2000] Out: 600 [Urine:525; Blood:75]  General appearance: alert, cooperative and moderate distress Eyes: conjunctivae/corneas clear. PERRL GI: Non distended, soft, minimal tenderness Incision/Wound: Clean and dry  Lab Results:   Recent Labs  05/09/16 1205  WBC 5.5  HGB 12.6  HCT 37.7  PLT 245   BMET  Recent Labs  05/09/16 1205  NA 141  K 4.1  CL 105  CO2 30  GLUCOSE 102*  BUN 14  CREATININE 0.89  CALCIUM 9.2     Studies/Results: No results found.  Anti-infectives: Anti-infectives    Start     Dose/Rate Route Frequency Ordered Stop   05/10/16 0600  cefoTEtan (CEFOTAN) 2 g in dextrose 5 % 50 mL IVPB     2 g 100 mL/hr over 30 Minutes Intravenous On call to O.R. 05/09/16 1108 05/10/16 0910   05/09/16 1400  neomycin (MYCIFRADIN) tablet 1,000 mg  Status:  Discontinued     1,000 mg Oral Every 8 hours 05/09/16 1125 05/09/16 1139   05/09/16 1400  metroNIDAZOLE (FLAGYL) tablet 1,000 mg  Status:  Discontinued     1,000 mg Oral 3 times per day 05/09/16 1125 05/09/16 1139   05/09/16 1108  neomycin (MYCIFRADIN) tablet 1,000 mg     1,000 mg Oral 3 times per day 05/09/16 1108 05/09/16 2221   05/09/16 1108  metroNIDAZOLE (FLAGYL) tablet 1,000 mg     1,000 mg Oral 3 times per day 05/09/16 1108 05/09/16 2222      Assessment/Plan: s/p  Procedure(s): LAPAROSCOPIC SIGMOID COLECTOMY Eye pain, ? Irritation from lubrication?  Exam at bedside OK. Appears stable.  Nausea meds ordered, monitor.    Edward Jolly MD, FACS  05/10/2016, 4:43 PM

## 2016-05-10 NOTE — Progress Notes (Signed)
D; pt c/o eye pain when ready to tx to floor.      Would not open eyes said it hurts, she would vomit, belly is hurt. Not cooperated to check eyes.      Pattie RN open to eyes, both eye vessels red.      Notified Dr Royce Macadamia by [facility] charge nurse,  No further orders received.

## 2016-05-10 NOTE — Anesthesia Procedure Notes (Signed)
Procedure Name: Intubation Date/Time: 05/10/2016 9:13 AM Performed by: Talbot Grumbling Pre-anesthesia Checklist: Patient identified, Emergency Drugs available, Suction available and Patient being monitored Patient Re-evaluated:Patient Re-evaluated prior to inductionOxygen Delivery Method: Circle system utilized Preoxygenation: Pre-oxygenation with 100% oxygen Intubation Type: IV induction and Cricoid Pressure applied Laryngoscope Size: Miller and 2 Grade View: Grade I Tube type: Oral Tube size: 7.5 mm Number of attempts: 1 Airway Equipment and Method: Stylet Placement Confirmation: ETT inserted through vocal cords under direct vision,  positive ETCO2 and breath sounds checked- equal and bilateral Secured at: 21 cm Tube secured with: Tape Dental Injury: Teeth and Oropharynx as per pre-operative assessment

## 2016-05-10 NOTE — Anesthesia Preprocedure Evaluation (Signed)
Anesthesia Evaluation  Patient identified by MRN, date of birth, ID band Patient awake    Reviewed: Allergy & Precautions, NPO status , Patient's Chart, lab work & pertinent test results  Airway Mallampati: II   Neck ROM: full    Dental   Pulmonary neg pulmonary ROS,    breath sounds clear to auscultation       Cardiovascular hypertension,  Rhythm:regular Rate:Normal     Neuro/Psych  Headaches,    GI/Hepatic hiatal hernia, GERD  ,  Endo/Other    Renal/GU      Musculoskeletal   Abdominal   Peds  Hematology   Anesthesia Other Findings   Reproductive/Obstetrics                             Anesthesia Physical Anesthesia Plan  ASA: II  Anesthesia Plan: General   Post-op Pain Management:    Induction: Intravenous  Airway Management Planned: Oral ETT  Additional Equipment:   Intra-op Plan:   Post-operative Plan: Extubation in OR  Informed Consent: I have reviewed the patients History and Physical, chart, labs and discussed the procedure including the risks, benefits and alternatives for the proposed anesthesia with the patient or authorized representative who has indicated his/her understanding and acceptance.     Plan Discussed with: CRNA, Anesthesiologist and Surgeon  Anesthesia Plan Comments:         Anesthesia Quick Evaluation

## 2016-05-10 NOTE — Op Note (Signed)
Preoperative Diagnosis: Diverticulitis sigmoid colon  Postoprative Diagnosis: Diverticulitis sigmoid colon  Procedure: Procedure(s): LAPAROSCOPIC SIGMOID COLECTOMY With mobilization of the splenic flexure   Surgeon: Excell Seltzer T   Assistants: Ralene Ok  Anesthesia:  General endotracheal anesthesia  Indications:  Patient is a 70 year old female with repeated documented episodes of sigmoid diverticulitis and now  Very frequent and essentially ongoing symptoms. After extensive preoperative workup and discussion regarding benefits and risks detailed elsewhere we have elected to proceed with laparoscopic sigmoid colectomy for treatment.    Procedure Detail:  Patient had undergone a mechanical and antibiotic bowel prep. She was taken to the operating room, placed in supine position on the operating table and general endotracheal anesthesia induced. She was carefully positioned in lithotomy position with arms tucked and padded  And carefully fixed to the bed with a pink pad.  Foley catheter was placed. PAS were in place. Orogastric tube was placed. She received preoperative IV antibiotics. The abdomen and perineum were widely sterilely prepped and draped. Patient timeout was performed and correct procedure verified. Access was attained with a 1 cm incision below the umbilicus with an open Hassan technique through a mattress suture of 0 Vicryl and pneumoperitoneum established. Under direct vision a 5 mm trocar was placed in the right lateral mid abdomen There were somechronic omental adhesions up over the cecum in the right lower quadrant that were taken down and then a 12 mm trocar placed in the right lower quadrant just above and medial to the iliac crest. On the  Left side 25 mm trochars were placed in mirror image positions. There was noted to be extensive adhesions of the omentum around the  Colon  In the left lower quadrant. Omental adhesions were all taken down with the Harmonic scalpel  and then the omentum  And small bowel could be completely displaced up out of the pelvis with the patient in steep Trendelenburg position. There was seen to be fairly marked chronic inflammatory change and thickeningof the sigmoid colon. There were inflammatory chronic adhesions to the lateral pelvic sidewall which were completely taken down and the line of Toldt identified laterally. Dissection was carried down into the pelvis and we found what appeared to be soft  And normal rectosigmoid without tinea just above the peritoneal reflection. At this point I approach the mesentery medially. The sigmoid and mesosigmoid were elevated and  The peritoneum incised at the base of the mesentery medially. Careful blunt dissection was carried into a nice avascular plane above the iliac vessels and below the inferior mesenteric pedicle. With careful blunt dissection the ureter was identified and then it was carefully exposed and dissected posteriorly along its course.With the ureter identified I was able to carefully bluntly encircled the inferior mesenteric artery pedicle and then this was divided with a single firing of the white load 60 mm echelon stapler.This then allowed further dissection of the sigmoid and left colon mesentery working medial to lateral out to the white line of Toldt.  The ureter was widely exposed and protected. We then divided the minimal lateral attachments remaining obliquely freeing the sigmoid colon. For mobility I elected to take down the splenic flexure.Patient was placed in reverse Trendelenburg and we continued to dissect up the proximal left colon laterally mobilizing up out of the retroperitoneum. At the mid transverse colon and the lesser sac was entered with the Harmonic scalpel and then the lesser omentum dissected distally working out toward the splenic flexure. Omentum was dissected up off of the  distal transverse colon. Thesplenic flexure could then be nicely exposed and the splenocolic  ligament divided with the Harmonic scalpel and thesplenic flexure completely mobilized with its mesentery up off the introitus fascia with full mobilization of the distal transverse colon and splenic flexure. At this point she was placed back in Trendelenburg and we chose a point for resection of the  Rectosigmoid and soft normal-appearing bowel without tinea. The mesentery was carefully dissected on either side with cautery and blunt dissection and then a window was created behind the rectosigmoid with blunt dissection. The rectosigmoid was divided with a single firing of the blue load 60 mm echelon stapler. The remaining mesentery of the distal rectosigmoid was then sequentially divided with the Harmonic scalpel again carefully protecting the ureter with it in full view until the mesenteric dissection joined the previous division more proximally and the colon was completely freed.  Asmall approximately 4 cm oblique incision was made in the left lower quadrant at the  5 mm trocar site and dissection was carried down the saphenous tissue. A muscle splitting incision was used dividing the external oblique along the lines of its fibers, bluntly splitting the internal oblique and then opening the peritoneum. A wound protector was placed and the  End of the divided colon was brought out and the entire sigmoid and the proximal left colon came easily out through the incision under no tension. An area of soft normal-appearing left colon was chosen for resection. The mesentery up to this point was divided between clamps and tied with 2-0 silk ties. The pursestring clamp was used to cross this area and the bowel divided and specimen removed. A 2-0 running pursestring suture was placed. The clamp was removed and the pursestring was intact. The  End of the colon was gently dilated and it was actually  Small diameter  Although there was no inflammation or thickening and I felt we could not use any larger than a 25 mm anvil.  This was placed and the pursestring suture secured and the end of the colon dropped back in through the left lower quadrant incision. The abdomen was reinsufflated. Dr. Rosendo Gros went below and passed the 25 mm dilator up through theanus. However about  5 cm proximal to the divided rectosigmoid there appeared to be a mild area of narrowing or scarring that prevented passing the dilator beyond this. I elected toresect  More distally beyond this area of narrowing. A pointjust above the peritoneal reflection was chosen with again soft rectosigmoid and mesentery was dissected and the colon was bluntly dissected posteriorly and divided again with the 60 mm blue load stapler. The mesentery of the segment was divided with the Harmonic scalpel again with the ureter in full view and the small segment removed. Dr. Rosendo Gros then went below and was able to easily pass the dilator up through the rectum to the staple line. The 25 mm stapler was passed and the spike deployed just posterior to the middle of the staple line. The anvil was attached under laparoscopic view and the stapler closed excluding any extraneous tissue. It was left in place for hemostasis for 1 minute and then fired and removed.Both doughnuts were intact.  Dr. Rosendo Gros insufflated the rectum with the rigid sigmoidoscope and with the proximal bowel clamped under saline irrigation  There was no evidence of leak at the anastomosis. There was no tension. Blood supply appeared excellent. The abdomen was irrigated and complete hemostasis assured. At this point all gloves and instruments were  changed and the patient was redraped. The incisions were infiltrated with Exparel. The left lower quadrant incision was closed in layers with 0 Vicryl. Skin incisions were closed with subcuticular Monocryl  And  Liquiban or Steri-Strips and sterile dressings applied. Patient was taken to recovery  In good condition.    Findings: As above  Estimated Blood Loss:  Minimal          Drains: none  Blood Given: none          Specimens: Rectosigmoid colon        Complications:  * No complications entered in OR log *         Disposition: PACU - hemodynamically stable.         Condition: stable

## 2016-05-10 NOTE — Anesthesia Postprocedure Evaluation (Signed)
Anesthesia Post Note  Patient: Kaitlin May  Procedure(s) Performed: Procedure(s) (LRB): LAPAROSCOPIC SIGMOID COLECTOMY (N/A)  Patient location during evaluation: PACU Anesthesia Type: General Level of consciousness: awake and alert and oriented Pain management: pain level controlled Vital Signs Assessment: post-procedure vital signs reviewed and stable Respiratory status: spontaneous breathing, nonlabored ventilation, respiratory function stable and patient connected to nasal cannula oxygen Cardiovascular status: blood pressure returned to baseline and stable Postop Assessment: no signs of nausea or vomiting Anesthetic complications: no    Last Vitals:  Filed Vitals:   05/10/16 1315 05/10/16 1330  BP: 167/81 143/68  Pulse: 98 102  Temp:    Resp: 22 23    Last Pain:  Filed Vitals:   05/10/16 1337  PainSc: Asleep                 [name]

## 2016-05-10 NOTE — Transfer of Care (Signed)
Immediate Anesthesia Transfer of Care Note  Patient: Kaitlin May  Procedure(s) Performed: Procedure(s): LAPAROSCOPIC SIGMOID COLECTOMY (N/A)  Patient Location: PACU  Anesthesia Type:General  Level of Consciousness:  sedated, patient cooperative and responds to stimulation  Airway & Oxygen Therapy:Patient Spontanous Breathing and Patient connected to face mask oxgen  Post-op Assessment:  Report given to PACU RN and Post -op Vital signs reviewed and stable  Post vital signs:  Reviewed and stable  Last Vitals:  Filed Vitals:   05/09/16 2156 05/10/16 0635  BP: 107/76 129/67  Pulse: 79 75  Temp:  36.3 C  Resp: 18 18    Complications: No apparent anesthesia complications

## 2016-05-10 NOTE — Interval H&P Note (Signed)
History and Physical Interval Note:  05/10/2016 8:54 AM  Kaitlin May  has presented today for surgery, with the diagnosis of Diverticulitis sigmoid colon  The various methods of treatment have been discussed with the patient and family. After consideration of risks, benefits and other options for treatment, the patient has consented to  Procedure(s): LAPAROSCOPIC SIGMOID COLECTOMY (N/A) as a surgical intervention .  The patient's history has been reviewed, patient examined, no change in status, stable for surgery.  I have reviewed the patient's chart and labs.  Questions were answered to the patient's satisfaction.     Byron Peacock T

## 2016-05-11 LAB — BASIC METABOLIC PANEL
Anion gap: 6 (ref 5–15)
BUN: 8 mg/dL (ref 6–20)
CHLORIDE: 108 mmol/L (ref 101–111)
CO2: 25 mmol/L (ref 22–32)
CREATININE: 0.75 mg/dL (ref 0.44–1.00)
Calcium: 8.4 mg/dL — ABNORMAL LOW (ref 8.9–10.3)
GLUCOSE: 139 mg/dL — AB (ref 65–99)
Potassium: 3.8 mmol/L (ref 3.5–5.1)
Sodium: 139 mmol/L (ref 135–145)

## 2016-05-11 LAB — CBC
HEMATOCRIT: 32.6 % — AB (ref 36.0–46.0)
HEMOGLOBIN: 11 g/dL — AB (ref 12.0–15.0)
MCH: 28.9 pg (ref 26.0–34.0)
MCHC: 33.7 g/dL (ref 30.0–36.0)
MCV: 85.8 fL (ref 78.0–100.0)
Platelets: 219 10*3/uL (ref 150–400)
RBC: 3.8 MIL/uL — ABNORMAL LOW (ref 3.87–5.11)
RDW: 13 % (ref 11.5–15.5)
WBC: 9.5 10*3/uL (ref 4.0–10.5)

## 2016-05-11 NOTE — Progress Notes (Signed)
1 Day Post-Op  Subjective: No more vomiting but still with some nausea.  Passed a very small amount of blood last night.  Having incisional pain.  Eye pain better with drops.  Objective: Vital signs in last 24 hours: Temp:  [97.1 F (36.2 C)-98.6 F (37 C)] 98.5 F (36.9 C) (06/24 0541) Pulse Rate:  [60-105] 60 (06/24 0541) Resp:  [12-23] 14 (06/24 0541) BP: (97-185)/(55-93) 113/58 mmHg (06/24 0541) SpO2:  [91 %-100 %] 91 % (06/24 0541) Last BM Date: 05/10/16  Intake/Output from previous day: 06/23 0701 - 06/24 0700 In: 2400 [I.V.:2400] Out: 2100 [Urine:2025; Blood:75] Intake/Output this shift: Total I/O In: -  Out: 250 [Urine:250]  PE: General- In NAD Abdomen-soft, dressings dry  Lab Results:   Recent Labs  05/09/16 1205 05/11/16 0409  WBC 5.5 9.5  HGB 12.6 11.0*  HCT 37.7 32.6*  PLT 245 219   BMET  Recent Labs  05/09/16 1205 05/11/16 0409  NA 141 139  K 4.1 3.8  CL 105 108  CO2 30 25  GLUCOSE 102* 139*  BUN 14 8  CREATININE 0.89 0.75  CALCIUM 9.2 8.4*   PT/INR No results for input(s): LABPROT, INR in the last 72 hours. Comprehensive Metabolic Panel:    Component Value Date/Time   NA 139 05/11/2016 0409   NA 141 05/09/2016 1205   K 3.8 05/11/2016 0409   K 4.1 05/09/2016 1205   CL 108 05/11/2016 0409   CL 105 05/09/2016 1205   CO2 25 05/11/2016 0409   CO2 30 05/09/2016 1205   BUN 8 05/11/2016 0409   BUN 14 05/09/2016 1205   CREATININE 0.75 05/11/2016 0409   CREATININE 0.89 05/09/2016 1205   CREATININE 0.84 02/03/2015 1225   GLUCOSE 139* 05/11/2016 0409   GLUCOSE 102* 05/09/2016 1205   CALCIUM 8.4* 05/11/2016 0409   CALCIUM 9.2 05/09/2016 1205   AST 16 04/30/2015 1658   AST 12* 03/31/2015 0054   ALT 13* 04/30/2015 1658   ALT 11* 03/31/2015 0054   ALKPHOS 91 04/30/2015 1658   ALKPHOS 90 03/31/2015 0054   BILITOT 0.5 04/30/2015 1658   BILITOT 0.6 03/31/2015 0054   PROT 7.4 04/30/2015 1658   PROT 7.4 03/31/2015 0054   ALBUMIN 4.5  04/30/2015 1658   ALBUMIN 4.2 03/31/2015 0054     Studies/Results: No results found.  Anti-infectives: Anti-infectives    Start     Dose/Rate Route Frequency Ordered Stop   05/10/16 0600  cefoTEtan (CEFOTAN) 2 g in dextrose 5 % 50 mL IVPB     2 g 100 mL/hr over 30 Minutes Intravenous On call to O.R. 05/09/16 1108 05/10/16 0910   05/09/16 1400  neomycin (MYCIFRADIN) tablet 1,000 mg  Status:  Discontinued     1,000 mg Oral Every 8 hours 05/09/16 1125 05/09/16 1139   05/09/16 1400  metroNIDAZOLE (FLAGYL) tablet 1,000 mg  Status:  Discontinued     1,000 mg Oral 3 times per day 05/09/16 1125 05/09/16 1139   05/09/16 1108  neomycin (MYCIFRADIN) tablet 1,000 mg     1,000 mg Oral 3 times per day 05/09/16 1108 05/09/16 2221   05/09/16 1108  metroNIDAZOLE (FLAGYL) tablet 1,000 mg     1,000 mg Oral 3 times per day 05/09/16 1108 05/09/16 2222      Assessment Active Problems:   Diverticulitis of sigmoid colon s/p laparoscopic assisted sigmoid colectomy 05/10/16-postop discomfort as expected; has not passed any more blood per rectum    Anxiety disorder-Ativan restarted   LOS:  2 days   Plan: OOB.  IS.  Keep on ice chips.   Knight Oelkers Lenna Sciara 05/11/2016

## 2016-05-12 NOTE — Progress Notes (Signed)
2 Days Post-Op  Subjective: Feeling better overall.  Less nausea.  Passed a little gas. Objective: Vital signs in last 24 hours: Temp:  [97.9 F (36.6 C)-99.1 F (37.3 C)] 98 F (36.7 C) (06/25 0454) Pulse Rate:  [82-94] 82 (06/25 0454) Resp:  [16] 16 (06/25 0454) BP: (117-149)/(67-73) 117/69 mmHg (06/25 0454) SpO2:  [98 %-100 %] 98 % (06/25 0454) Last BM Date: 05/08/16  Intake/Output from previous day: 06/24 0701 - 06/25 0700 In: 3120 [P.O.:120; I.V.:2800; IV Piggyback:200] Out: 2250 [Urine:2250] Intake/Output this shift:    PE: General- In NAD Abdomen-soft, dressings dry, active bowel sounds  Lab Results:   Recent Labs  05/09/16 1205 05/11/16 0409  WBC 5.5 9.5  HGB 12.6 11.0*  HCT 37.7 32.6*  PLT 245 219   BMET  Recent Labs  05/09/16 1205 05/11/16 0409  NA 141 139  K 4.1 3.8  CL 105 108  CO2 30 25  GLUCOSE 102* 139*  BUN 14 8  CREATININE 0.89 0.75  CALCIUM 9.2 8.4*   PT/INR No results for input(s): LABPROT, INR in the last 72 hours. Comprehensive Metabolic Panel:    Component Value Date/Time   NA 139 05/11/2016 0409   NA 141 05/09/2016 1205   K 3.8 05/11/2016 0409   K 4.1 05/09/2016 1205   CL 108 05/11/2016 0409   CL 105 05/09/2016 1205   CO2 25 05/11/2016 0409   CO2 30 05/09/2016 1205   BUN 8 05/11/2016 0409   BUN 14 05/09/2016 1205   CREATININE 0.75 05/11/2016 0409   CREATININE 0.89 05/09/2016 1205   CREATININE 0.84 02/03/2015 1225   GLUCOSE 139* 05/11/2016 0409   GLUCOSE 102* 05/09/2016 1205   CALCIUM 8.4* 05/11/2016 0409   CALCIUM 9.2 05/09/2016 1205   AST 16 04/30/2015 1658   AST 12* 03/31/2015 0054   ALT 13* 04/30/2015 1658   ALT 11* 03/31/2015 0054   ALKPHOS 91 04/30/2015 1658   ALKPHOS 90 03/31/2015 0054   BILITOT 0.5 04/30/2015 1658   BILITOT 0.6 03/31/2015 0054   PROT 7.4 04/30/2015 1658   PROT 7.4 03/31/2015 0054   ALBUMIN 4.5 04/30/2015 1658   ALBUMIN 4.2 03/31/2015 0054     Studies/Results: No results  found.  Anti-infectives: Anti-infectives    Start     Dose/Rate Route Frequency Ordered Stop   05/10/16 0600  cefoTEtan (CEFOTAN) 2 g in dextrose 5 % 50 mL IVPB     2 g 100 mL/hr over 30 Minutes Intravenous On call to O.R. 05/09/16 1108 05/10/16 0910   05/09/16 1400  neomycin (MYCIFRADIN) tablet 1,000 mg  Status:  Discontinued     1,000 mg Oral Every 8 hours 05/09/16 1125 05/09/16 1139   05/09/16 1400  metroNIDAZOLE (FLAGYL) tablet 1,000 mg  Status:  Discontinued     1,000 mg Oral 3 times per day 05/09/16 1125 05/09/16 1139   05/09/16 1108  neomycin (MYCIFRADIN) tablet 1,000 mg     1,000 mg Oral 3 times per day 05/09/16 1108 05/09/16 2221   05/09/16 1108  metroNIDAZOLE (FLAGYL) tablet 1,000 mg     1,000 mg Oral 3 times per day 05/09/16 1108 05/09/16 2222      Assessment Active Problems:   Diverticulitis of sigmoid colon s/p laparoscopic assisted sigmoid colectomy- 05/10/16-improving    Anxiety disorder-Ativan restarted   LOS: 3 days   Plan: Clear liquids.   Ellese Julius Lenna Sciara 05/12/2016

## 2016-05-13 MED ORDER — FAMOTIDINE 20 MG PO TABS
20.0000 mg | ORAL_TABLET | Freq: Two times a day (BID) | ORAL | Status: DC
  Administered 2016-05-13 – 2016-05-15 (×5): 20 mg via ORAL
  Filled 2016-05-13 (×5): qty 1

## 2016-05-13 NOTE — Progress Notes (Signed)
3 Days Post-Op  Subjective: Pt doing well some nausea.  Min CLD PO  Objective: Vital signs in last 24 hours: Temp:  [98.3 F (36.8 C)-98.8 F (37.1 C)] 98.8 F (37.1 C) (06/25 2150) Pulse Rate:  [85-92] 85 (06/25 2150) Resp:  [16-18] 18 (06/25 2150) BP: (106-121)/(62-70) 116/64 mmHg (06/25 2150) SpO2:  [97 %-100 %] 97 % (06/25 2150) Last BM Date: 05/10/16  Intake/Output from previous day: 06/25 0701 - 06/26 0700 In: 1360 [P.O.:60; I.V.:1200; IV Piggyback:100] Out: 2300 [Urine:2300] Intake/Output this shift:    General appearance: alert and cooperative GI: soft, approp ttp, nd incision c/d/i  Lab Results:   Recent Labs  05/11/16 0409  WBC 9.5  HGB 11.0*  HCT 32.6*  PLT 219   BMET  Recent Labs  05/11/16 0409  NA 139  K 3.8  CL 108  CO2 25  GLUCOSE 139*  BUN 8  CREATININE 0.75  CALCIUM 8.4*   PT/INR No results for input(s): LABPROT, INR in the last 72 hours. ABG No results for input(s): PHART, HCO3 in the last 72 hours.  Invalid input(s): PCO2, PO2  Studies/Results: No results found.  Anti-infectives: Anti-infectives    Start     Dose/Rate Route Frequency Ordered Stop   05/10/16 0600  cefoTEtan (CEFOTAN) 2 g in dextrose 5 % 50 mL IVPB     2 g 100 mL/hr over 30 Minutes Intravenous On call to O.R. 05/09/16 1108 05/10/16 0910   05/09/16 1400  neomycin (MYCIFRADIN) tablet 1,000 mg  Status:  Discontinued     1,000 mg Oral Every 8 hours 05/09/16 1125 05/09/16 1139   05/09/16 1400  metroNIDAZOLE (FLAGYL) tablet 1,000 mg  Status:  Discontinued     1,000 mg Oral 3 times per day 05/09/16 1125 05/09/16 1139   05/09/16 1108  neomycin (MYCIFRADIN) tablet 1,000 mg     1,000 mg Oral 3 times per day 05/09/16 1108 05/09/16 2221   05/09/16 1108  metroNIDAZOLE (FLAGYL) tablet 1,000 mg     1,000 mg Oral 3 times per day 05/09/16 1108 05/09/16 2222      Assessment/Plan: s/p Procedure(s): LAPAROSCOPIC SIGMOID COLECTOMY (N/A) Advance diet to FLD Mobilize Await  bowel function  LOS: 4 days    Rosario Jacks., Anne Hahn 05/13/2016

## 2016-05-13 NOTE — Care Management Important Message (Signed)
Important Message  Patient Details  Name: CANDIDA GOULDER MRN: [id number] Date of Birth: May 22, 1946   Medicare Important Message Given:  Yes    Camillo Flaming 05/13/2016, 11:55 AMImportant Message  Patient Details  Name: FEVEN PETTERSON MRN: [id number] Date of Birth: July 11, 1946   Medicare Important Message Given:  Yes    Camillo Flaming 05/13/2016, 11:55 AM

## 2016-05-13 NOTE — Progress Notes (Signed)
Key Points: Use following P&T approved IV to PO antibiotic change policy.  Description contains the criteria that are approved Note: Policy Excludes:  Esophagectomy patientsPHARMACIST - PHYSICIAN COMMUNICATION  CONCERNING: IV to Oral Route Change Policy  RECOMMENDATION: This patient is receiving Pepcid by the intravenous route.  Based on criteria approved by the Pharmacy and Therapeutics Committee, the intravenous medication(s) is/are being converted to the equivalent oral dose form(s).   DESCRIPTION: These criteria include:  The patient is eating (either orally or via tube) and/or has been taking other orally administered medications for a least 24 hours  The patient has no evidence of active gastrointestinal bleeding or impaired GI absorption (gastrectomy, short bowel, patient on TNA or NPO).  If you have questions about this conversion, please contact the Pharmacy Department  []   [phone] )  [facility] []   [phone] )  [facility] []   [phone] )  [facility] []   [phone] )  [facility] [x]   [phone] )  [place], [facility] 05/13/2016 9:18 AM

## 2016-05-14 MED ORDER — LACTINEX PO CHEW
1.0000 | CHEWABLE_TABLET | Freq: Three times a day (TID) | ORAL | Status: DC
  Administered 2016-05-14 – 2016-05-15 (×2): 1 via ORAL
  Filled 2016-05-14 (×4): qty 1

## 2016-05-14 MED ORDER — METOCLOPRAMIDE HCL 5 MG/ML IJ SOLN
10.0000 mg | Freq: Four times a day (QID) | INTRAMUSCULAR | Status: DC
  Administered 2016-05-14 – 2016-05-15 (×4): 10 mg via INTRAVENOUS
  Filled 2016-05-14 (×4): qty 2

## 2016-05-14 MED ORDER — BACID PO TABS
2.0000 | ORAL_TABLET | Freq: Three times a day (TID) | ORAL | Status: DC
  Filled 2016-05-14: qty 2

## 2016-05-14 NOTE — Care Management Note (Signed)
Case Management Note  Patient Details  Name: FARRELL BROERMAN MRN: [id number] Date of Birth: 04/06/1946  Subjective/Objective:                  LAPAROSCOPIC SIGMOID COLECTOMY (N/A) Action/Plan: Discharge planning Expected Discharge Date:   (UNKNOWN)               Expected Discharge Plan:  Home/Self Care  In-House Referral:     Discharge planning Services  CM Consult  Post Acute Care Choice:    Choice offered to:  Patient  DME Arranged:  3-N-1 DME Agency:  [place]:  NA [place] Agency:  NA  Status of Service:  Completed, signed off  If discussed at [place] of Stay Meetings, dates discussed:    Additional Comments: Pt disposition discussed in LOS; current plan for discharge is tomorrow as today pt has uncontrolled Nausea and diet to be advanced if Nausea can be quelled by addition of Reglan and pt tolerates diet.  CM met with pt in room to discuss discharge needs; pt to go home self care with support of spouse (in room with pt).   Pt is ambulating in hallway and states she feels steady on her feet.  Pt has requested bedside commode because of bowel urgency and support when getting up from toilet.  CM has reqeusted DMe order and called [facility] DME rep, Germaine to please deliver the 3n1 to room prior to discharge.  No other CM needs were communicated. Dellie Catholic, RN 05/14/2016, 3:02 PM

## 2016-05-14 NOTE — Progress Notes (Signed)
4 Days Post-Op  Subjective: Pt doing well tol PO.  + BMs.  Ambulating well  Objective: Vital signs in last 24 hours: Temp:  [97.9 F (36.6 C)-98.1 F (36.7 C)] 98.1 F (36.7 C) (06/27 0615) Pulse Rate:  [81-84] 84 (06/27 0615) Resp:  [16] 16 (06/27 0615) BP: (100)/(47-48) 100/47 mmHg (06/27 0615) SpO2:  [96 %-97 %] 97 % (06/27 0615) Last BM Date: 05/14/16  Intake/Output from previous day: 06/26 0701 - 06/27 0700 In: 3207.9 [P.O.:1320; I.V.:1887.9] Out: 1875 [Urine:1875] Intake/Output this shift: Total I/O In: 880 [P.O.:580; I.V.:300] Out: 600 [Urine:600]  General appearance: alert and cooperative GI: soft, non-tender; bowel sounds normal; no masses,  no organomegaly   Assessment/Plan: s/p Procedure(s): LAPAROSCOPIC SIGMOID COLECTOMY (N/A) Advance diet to reg Mobilize Add reglan for nausea Plan for home tomorrow  LOS: 5 days    Rosario Jacks., Anne Hahn 05/14/2016

## 2016-05-15 MED ORDER — HYDROCODONE-ACETAMINOPHEN 5-325 MG PO TABS: 1.0000 | ORAL_TABLET | ORAL | Status: DC | PRN

## 2016-05-15 NOTE — Discharge Summary (Signed)
Physician Discharge Summary  Patient ID: Kaitlin May MRN: [id number] DOB/AGE: 05/27/1946 70 y.o.  Admit date: 05/09/2016 Discharge date: 05/15/2016  Admission Diagnoses: s/p lap sigmoid colectomy  Discharge Diagnoses:  Active Problems:   Diverticulitis of sigmoid colon   Discharged Condition: good  Hospital Course: PT was admitted post op to the floor.  Pt was slowly adv'd from a CLD to a reg diet.  PT tolerated that well.  She had good pain control.  She had normal bowel functoin and was ambulating in the hallway normally.  She's had no fevers and was deemed stable for DC and [place] home.  Consults: None  Significant Diagnostic Studies: none  Treatments: surgery: as above  Discharge Exam: Blood pressure 114/67, pulse 79, temperature 98.5 F (36.9 C), temperature source Oral, resp. rate 14, height 5\' 8"  (1.727 m), weight 74.662 kg (164 lb 9.6 oz), SpO2 100 %. General appearance: alert and cooperative GI: soft, non-tender; bowel sounds normal; no masses,  no organomegaly and incinsion c/d/i  Disposition: 01-Home or Self Care  Discharge Instructions    Diet - low sodium heart healthy    Complete by:  As directed      Increase activity slowly    Complete by:  As directed             Medication List    STOP taking these medications        lidocaine-hydrocortisone 3-0.5 % Crea  Commonly known as:  ANAMANTEL HC      TAKE these medications        butalbital-acetaminophen-caffeine 50-325-40 MG tablet  Commonly known as:  FIORICET, ESGIC  Take by mouth 2 (two) times daily as needed for headache. ONLY AS NEEDED FOR MIGRAINES     dicyclomine 20 MG tablet  Commonly known as:  BENTYL  Take 20 mg by mouth 2 (two) times daily as needed for spasms.     diltiazem 2 % Gel  Apply 1 application topically 2 (two) times daily as needed (fisures).     HYDROcodone-acetaminophen 5-325 MG tablet  Commonly known as:  NORCO/VICODIN  Take 1 tablet by mouth 2 (two) times daily as  needed for moderate pain.     HYDROcodone-acetaminophen 5-325 MG tablet  Commonly known as:  NORCO/VICODIN  Take 1-2 tablets by mouth every 4 (four) hours as needed for moderate pain.     lansoprazole 30 MG capsule  Commonly known as:  PREVACID  Take 30 mg by mouth daily.     linaclotide 145 MCG Caps capsule  Commonly known as:  LINZESS  Take 1 capsule (145 mcg total) by mouth daily. On empty stomach.     LORazepam 1 MG tablet  Commonly known as:  ATIVAN  Take 1 mg by mouth 3 (three) times daily as needed for anxiety.     ondansetron 4 MG tablet  Commonly known as:  ZOFRAN  Take 4 mg by mouth 3 (three) times daily.     polyethylene glycol packet  Commonly known as:  MIRALAX / GLYCOLAX  Take 17 g by mouth daily as needed for mild constipation.     promethazine 25 MG tablet  Commonly known as:  PHENERGAN  Take 25 mg by mouth every 6 (six) hours as needed for nausea or vomiting.           Follow-up Information    Follow up with [facility].   Why:  3n1 (over the commmode seat)   Contact information:  [address] [place] [address] [phone]       Follow up with Edward Jolly, MD. Schedule an appointment as soon as possible for a visit in 2 weeks.   Specialty:  General Surgery   Why:  For wound re-check   Contact information:   [place] [address] [phone]       Signed: Rosario Jacks., [facility] 05/15/2016, 7:44 AM

## 2016-05-15 NOTE — Discharge Instructions (Signed)
ABDOMINAL SURGERY: POST OP INSTRUCTIONS  1. DIET: Follow a light bland diet the first 24 hours after arrival home, such as soup, liquids, crackers, etc.  Be sure to include lots of fluids daily.  Avoid fast food or heavy meals as your are more likely to get nauseated.  Eat a low fat the next few days after surgery.   2. Take your usually prescribed home medications unless otherwise directed. 3. PAIN CONTROL: a. Pain is best controlled by a usual combination of three different methods TOGETHER: i. Ice/Heat ii. Over the counter pain medication iii. Prescription pain medication b. Most patients will experience some swelling and bruising around the incisions.  Ice packs or heating pads (30-60 minutes up to 6 times a day) will help. Use ice for the first few days to help decrease swelling and bruising, then switch to heat to help relax tight/sore spots and speed recovery.  Some people prefer to use ice alone, heat alone, alternating between ice & heat.  Experiment to what works for you.  Swelling and bruising can take several weeks to resolve.   c. It is helpful to take an over-the-counter pain medication regularly for the first few weeks.  Choose one of the following that works best for you: i. Naproxen (Aleve, etc)  Two 228m tabs twice a day ii. Ibuprofen (Advil, etc) Three 2071mtabs four times a day (every meal & bedtime) iii. Acetaminophen (Tylenol, etc) 500-65061mour times a day (every meal & bedtime) d. A  prescription for pain medication (such as oxycodone, hydrocodone, etc) should be given to you upon discharge.  Take your pain medication as prescribed.  i. If you are having problems/concerns with the prescription medicine (does not control pain, nausea, vomiting, rash, itching, etc), please call us [phone] see if we need to switch you to a different pain medicine that will work better for you and/or control your side effect better. ii. If you need a refill on your pain medication,  please contact your pharmacy.  They will contact our office to request authorization. Prescriptions will not be filled after 5 pm or on week-ends. 4. Avoid getting constipated.  Between the surgery and the pain medications, it is common to experience some constipation.  Increasing fluid intake and taking a fiber supplement (such as Metamucil, Citrucel, FiberCon, MiraLax, etc) 1-2 times a day regularly will usually help prevent this problem from occurring.  A mild laxative (prune juice, Milk of Magnesia, MiraLax, etc) should be taken according to package directions if there are no bowel movements after 48 hours.   5. Watch out for diarrhea.  If you have many loose bowel movements, simplify your diet to bland foods & liquids for a few days.  Stop any stool softeners and decrease your fiber supplement.  Switching to mild anti-diarrheal medications (Kayopectate, Pepto Bismol) can help.  If this worsens or does not improve, please call us.[place]. Wash / shower every day.  You may shower over the incision / wound.  Avoid baths until the skin is fully healed.  Continue to shower over incision(s) after the dressing is off. 7. Remove your waterproof bandages 5 days after surgery.  You may leave the incision open to air.  Remove any wicks or ribbons in your wound.  If you have an open wound, please see wound care instructions. You may replace a dressing/Band-Aid to cover the incision for comfort if you wish. 8. ACTIVITIES as tolerated:   a. You may resume regular (light)  daily activities beginning the next day--such as daily self-care, walking, climbing stairs--gradually increasing activities as tolerated.  If you can walk 30 minutes without difficulty, it is safe to try more intense activity such as jogging, treadmill, bicycling, low-impact aerobics, swimming, etc. °b. Save the most intensive and strenuous activity for last such as sit-ups, heavy lifting, contact sports, etc  Refrain from any heavy lifting or straining  until you are off narcotics for pain control.   °c. DO NOT PUSH THROUGH PAIN.  Let pain be your guide: If it hurts to do something, don't do it.  Pain is your body warning you to avoid that activity for another week until the pain goes down. °d. You may drive when you are no longer taking prescription pain medication, you can comfortably wear a seatbelt, and you can safely maneuver your car and apply brakes. °e. You may have sexual intercourse when it is comfortable.  °9. FOLLOW UP in our office °a. Please call [facility] at [phone] to set up an appointment to see your surgeon in the office for a follow-up appointment approximately 1-2 weeks after your surgery. °b. Make sure that you call for this appointment the day you arrive home to insure a convenient appointment time. °10. IF YOU HAVE DISABILITY OR FAMILY LEAVE FORMS, BRING THEM TO THE OFFICE FOR PROCESSING.  DO NOT GIVE THEM TO YOUR DOCTOR. ° ° °WHEN TO CALL US [phone]: °1. Poor pain control °2. Reactions / problems with new medications (rash/itching, nausea, etc)  °3. Fever over 101.5 F (38.5 C) °4. Inability to urinate °5. Nausea and/or vomiting °6. Worsening swelling or bruising °7. Continued bleeding from incision. °8. Increased pain, redness, or drainage from the incision ° °The clinic staff is available to answer your questions during regular business hours (8:30am-5pm).  Please don’t hesitate to call and ask to speak to one of our nurses for clinical concerns.   A surgeon from [facility] is always on call at the hospitals °  °If you have a medical emergency, go to the nearest emergency room or call 911. °  ° °[facility] °[address], [address], [place],   [address] ? °MAIN: [phone] ? TOLL FREE: [phone] ? °FAX [phone] °[email or web address] ° ° ° °

## 2016-05-15 NOTE — Progress Notes (Signed)
Pt's vitals are WNL, tolerating diet and pain is under control. Discussed discharge instructions with both patient and family. Discharged to home with prescription.

## 2016-06-17 DIAGNOSIS — I10 Essential (primary) hypertension: Secondary | ICD-10-CM | POA: Diagnosis not present

## 2016-06-17 DIAGNOSIS — E7800 Pure hypercholesterolemia, unspecified: Secondary | ICD-10-CM | POA: Diagnosis not present

## 2016-06-27 ENCOUNTER — Other Ambulatory Visit (HOSPITAL_COMMUNITY): Payer: Self-pay | Admitting: General Surgery

## 2016-06-27 DIAGNOSIS — Z9049 Acquired absence of other specified parts of digestive tract: Secondary | ICD-10-CM

## 2016-07-03 DIAGNOSIS — K589 Irritable bowel syndrome without diarrhea: Secondary | ICD-10-CM | POA: Diagnosis not present

## 2016-07-03 DIAGNOSIS — D333 Benign neoplasm of cranial nerves: Secondary | ICD-10-CM | POA: Diagnosis not present

## 2016-07-03 DIAGNOSIS — K5900 Constipation, unspecified: Secondary | ICD-10-CM | POA: Diagnosis not present

## 2016-07-03 DIAGNOSIS — Z299 Encounter for prophylactic measures, unspecified: Secondary | ICD-10-CM | POA: Diagnosis not present

## 2016-07-05 DIAGNOSIS — I10 Essential (primary) hypertension: Secondary | ICD-10-CM | POA: Diagnosis not present

## 2016-07-05 DIAGNOSIS — E7800 Pure hypercholesterolemia, unspecified: Secondary | ICD-10-CM | POA: Diagnosis not present

## 2016-07-09 ENCOUNTER — Ambulatory Visit (HOSPITAL_COMMUNITY)
Admission: RE | Admit: 2016-07-09 | Discharge: 2016-07-09 | Disposition: A | Discharge status: Home / Self Care | Payer: Medicare Other | Source: Ambulatory Visit | Attending: General Surgery | Admitting: General Surgery

## 2016-07-09 DIAGNOSIS — K5900 Constipation, unspecified: Secondary | ICD-10-CM | POA: Diagnosis not present

## 2016-07-09 DIAGNOSIS — R195 Other fecal abnormalities: Secondary | ICD-10-CM | POA: Insufficient documentation

## 2016-07-09 DIAGNOSIS — Z9049 Acquired absence of other specified parts of digestive tract: Secondary | ICD-10-CM | POA: Diagnosis not present

## 2016-07-09 DIAGNOSIS — G43909 Migraine, unspecified, not intractable, without status migrainosus: Secondary | ICD-10-CM | POA: Diagnosis not present

## 2016-07-09 MED ORDER — DIATRIZOATE MEGLUMINE & SODIUM 66-10 % PO SOLN
30.0000 mL | Freq: Once | ORAL | Status: AC
  Administered 2016-07-09: 30 mL via ORAL
  Filled 2016-07-09: qty 30

## 2016-07-17 NOTE — Telephone Encounter (Signed)
Patient returned phone call. I informed of her Dr. Celesta Aver decision.

## 2016-07-18 ENCOUNTER — Telehealth: Payer: Self-pay | Admitting: Internal Medicine

## 2016-07-18 NOTE — Telephone Encounter (Signed)
Pt is aware of her OV with RMR on 9/8, but she needs to know what she can do for her diarrhea before then. She said that linzess gives her diarrhea and miralax is causing her to have mucus in her stools and she doesn't know what to take. Please advise and call [phone]

## 2016-07-19 NOTE — Telephone Encounter (Signed)
Pt had her colon surgery on 05/10/16 with Dr.Hoxworth. She now has constipation and will see a lot of mucous when she has a bm. Dr.Hoxworth told her to take miralax, benefiber and linzess 53mcg. The miralax doesn't produce a good bm and the linzess causes diarrhea. Pt is taking it once a day on an empty stomach. Pt has ov Friday with RMR but she wants to know if there is anything that we can do to help her constipation until her ov?

## 2016-07-19 NOTE — Telephone Encounter (Signed)
Please instruct patient to try Linzess  every other day, stopg MiraLax and use Benefiber every day and see if this does not help normalize her stool habits.

## 2016-07-23 NOTE — Telephone Encounter (Signed)
Tried to call pt- NA- LMOM with recommendations.  

## 2016-07-26 ENCOUNTER — Ambulatory Visit (INDEPENDENT_AMBULATORY_CARE_PROVIDER_SITE_OTHER): Payer: Medicare Other | Admitting: Internal Medicine

## 2016-07-26 ENCOUNTER — Encounter: Payer: Self-pay | Admitting: Internal Medicine

## 2016-07-26 VITALS — BP 126/79 | HR 79 | Temp 97.5°F | Ht 68.0 in | Wt 162.6 lb

## 2016-07-26 DIAGNOSIS — K588 Other irritable bowel syndrome: Secondary | ICD-10-CM | POA: Diagnosis not present

## 2016-07-26 DIAGNOSIS — K5732 Diverticulitis of large intestine without perforation or abscess without bleeding: Secondary | ICD-10-CM

## 2016-07-26 DIAGNOSIS — D126 Benign neoplasm of colon, unspecified: Secondary | ICD-10-CM | POA: Diagnosis not present

## 2016-07-26 NOTE — Patient Instructions (Signed)
Keep a stool diary  Take Benefiber 1 teaspoon twice daily-every day  Take Linzess 72 mcg capsule every other day as a scheduled medication.  Avoid taking hydrocodone and Bentyl as much is possible  Keep appointment with Dr. Excell Seltzer  Plan for surveillance colonoscopy (history of colonic polyps) 2019  Office visit here in one month.  Notice: Thisdictation was prepared with Dragon dictation along with smaller phrase technology. Any transcriptional errors that result from this process are unintentional and may not be corrected upon review

## 2016-07-26 NOTE — Progress Notes (Signed)
Primary Care Physician:  Glenda Chroman, MD Primary Gastroenterologist:  Dr. Gala Romney  Pre-Procedure History & Physical: HPI:  Kaitlin May is a 70 y.o. female here for evaluation of intermittent alternating constipation diarrhea. Has history of constipation predominant IBS. Also, has a history of well-documented sigmoid diverticulitis. In fact, underwent a laparoscopic sigmoid resection by Dr. Excell Seltzer in [facility] June 23 of this year. She appears to have had an unremarkable postoperative course. She scheduled to see Dr. Excell Seltzer back on the 20th of this month  Patient hasn't had any diverticulitis-like pain since surgery. However, she's had quite a bit of alternating extremes of constipation / diarrhea. She is alters her dosing of Linzess and MiraLax on a day-to-day day basis. Say she may or may not have a bowel movement for 5 days then takes Linzess - then has  multiple loose stools which makes her "weak";  No BRBPR.  Has not had any nausea or vomiting. She appears to be preoccupied with her bowel symptoms.  Potentially complicating  matters further, is the fact she's taking hydrocodone sporadically for occasional abdominal discomfort as well.  History of colonic adenoma-due for surveillance examination 2019.  Past Medical History:  Diagnosis Date  . Acoustic neuroma (HCC)    in situ  . Adenoma of large intestine 03/25/11   tcs by Dr. Gala Romney  . C. difficile colitis   . Diverticula of colon   . Diverticulitis     Negative CTs in 2004, 2009, 2011, 2013. Positive for sigmoid diverticulitis in 03/2009  . GERD (gastroesophageal reflux disease)   . HTN (hypertension)   . Migraines    uses demerol and phenergan for migraines due to severity of pain, nausea  . Rectal fistula   . Seasonal allergies     Past Surgical History:  Procedure Laterality Date  . ABDOMINAL HYSTERECTOMY     endometrial hyperplasia, irreg bleeding  . APPENDECTOMY    . COLONOSCOPY  03/25/11   Dr. Elwyn Reach sided  diverticula, normal rectum, tubular adenoma. Due for next TCS 03/2018.   [facility] Kitchen COLONOSCOPY WITH PROPOFOL N/A 07/13/2015   Dr.Britt Theard- marked left colon colonic diverticulosis. no evidence of colonic neoplasm. single colonic polyp at the splenic flexure. bx= tubular adenoma  . ESOPHAGEAL DILATION N/A 07/13/2015   Procedure: ESOPHAGEAL DILATION [place];  Surgeon: Daneil Dolin, MD;  Location: [facility];  Service: Endoscopy;  Laterality: N/A;  . ESOPHAGOGASTRODUODENOSCOPY (EGD) WITH PROPOFOL N/A 07/13/2015   Dr.Avilyn Virtue- prominent schatzki's ring, o/w normal appearing esohagus, hiatal hernia, multiple benign-appearing gastric polyps bx= benign fundic gland polyp  . LAPAROSCOPIC SIGMOID COLECTOMY N/A 05/10/2016   Procedure: LAPAROSCOPIC SIGMOID COLECTOMY;  Surgeon: Excell Seltzer, MD;  Location: [facility];  Service: General;  Laterality: N/A;  . OOPHORECTOMY     LSO  . POLYPECTOMY N/A 07/13/2015   Procedure: POLYPECTOMY;  Surgeon: Daneil Dolin, MD;  Location: [facility];  Service: Endoscopy;  Laterality: N/A;  . TONSILLECTOMY      Prior to Admission medications   Medication Sig Start Date End Date Taking? Authorizing Provider  butalbital-acetaminophen-caffeine (FIORICET, ESGIC) 50-325-40 MG per tablet Take by mouth 2 (two) times daily as needed for headache. ONLY AS NEEDED FOR MIGRAINES   Yes Historical Provider, MD  HYDROcodone-acetaminophen (NORCO/VICODIN) 5-325 MG tablet Take 1 tablet by mouth 2 (two) times daily as needed for moderate pain.  04/13/16  Yes Historical Provider, MD  HYDROcodone-acetaminophen (NORCO/VICODIN) 5-325 MG tablet Take 1-2 tablets by mouth every 4 (four) hours as  needed for moderate pain. 05/15/16  Yes Ralene Ok, MD  lansoprazole (PREVACID) 30 MG capsule Take 30 mg by mouth daily.  04/18/15  Yes Historical Provider, MD  Linaclotide Rolan Lipa) 145 MCG CAPS capsule Take 1 capsule (145 mcg total) by mouth daily. On empty stomach. Patient taking differently: Take 145 mcg by  mouth daily.  06/28/15  Yes Mahala Menghini, PA-C  LORazepam (ATIVAN) 1 MG tablet Take 1 mg by mouth 3 (three) times daily as needed for anxiety.    Yes Historical Provider, MD  ondansetron (ZOFRAN) 4 MG tablet Take 4 mg by mouth 3 (three) times daily.  04/08/16  Yes Historical Provider, MD  polyethylene glycol (MIRALAX / GLYCOLAX) packet Take 17 g by mouth daily as needed for mild constipation.    Yes Historical Provider, MD  promethazine (PHENERGAN) 25 MG tablet Take 25 mg by mouth every 6 (six) hours as needed for nausea or vomiting.  09/04/15  Yes Historical Provider, MD  dicyclomine (BENTYL) 20 MG tablet Take 20 mg by mouth 2 (two) times daily as needed for spasms.    Historical Provider, MD  diltiazem 2 % GEL Apply 1 application topically 2 (two) times daily as needed (fisures).    Historical Provider, MD    Allergies as of 07/26/2016 - Review Complete 07/26/2016  Allergen Reaction Noted  . Other Shortness Of Breath 05/01/2016  . Dilaudid [hydromorphone hcl] Nausea And Vomiting 07/10/2015  . Prednisone Palpitations 01/18/2015    Family History  Problem Relation Age of Onset  . Melanoma Father     deceased at 70  . Diabetes Mother   . Dementia Mother     deceased at 40  . Colon cancer Neg Hx   . Diabetes Brother   . Heart failure Brother     Social History   Social History  . Marital status: Married    Spouse name: N/A  . Number of children: N/A  . Years of education: N/A   Occupational History  . Not on file.   Social History Main Topics  . Smoking status: Never Smoker  . Smokeless tobacco: Never Used     Comment: Never smoked  . Alcohol use No  . Drug use: No  . Sexual activity: Not Currently    Partners: Male    Birth control/ protection:      Comment: 1st intercourse 52 yo-1 partner   Other Topics Concern  . Not on file   Social History Narrative  . No narrative on file    Review of Systems: See HPI, otherwise negative ROS  Physical Exam: BP 126/79    Pulse 79   Temp 97.5 F (36.4 C) (Oral)   Ht 5\' 8"  (1.727 m)   Wt 162 lb 9.6 oz (73.8 kg)   BMI 24.72 kg/m  General:   Alert,  Anxious appearing, pleasant and cooperative in NAD;  Accompanied by her spouse. Neck:  Supple; no masses or thyromegaly. No significant cervical adenopathy. Lungs:  Clear throughout to auscultation.   No wheezes, crackles, or rhonchi. No acute distress. Heart:  Regular rate and rhythm; no murmurs, clicks, rubs,  or gallops. Abdomen: Surgical sites look good-well healing.  Non-distended, normal bowel sounds.  Soft and nontender without appreciable mass or hepatosplenomegaly.  Pulses:  Normal pulses noted. Extremities:  Without clubbing or edema.  Impression;    70 year old lady with recurrent documented sigmoid diverticulitis,  status post sigmoid colectomy back in June of this year. History of constipation predominant irritable bowel  syndrome. She has had an unremarkable post operative course.   However, she continues to have significant bowel complaints of alternating constipation and diarrhea.  As is usually the case, there appears to be significant functional overlay regarding her symptoms.  Rapidly changing her medical regimen on her her own has made it difficult to establish a steady state.  The use of dicyclomine and hydrocodone likely counterproductive a good part of the time as her symptoms are predominantly that of constipation.   History of colonic adenoma; due for surveillance examination 2019.  Recommendations:  Keep a stool diary (our goal is to achieve at least 1 bowel movement every other day or 3 per week)  Take Benefiber 1 teaspoon twice daily-every day  Take Linzess 72 mcg capsule every other day as a scheduled medication.  Avoid taking hydrocodone and Bentyl as much is possible  Keep appointment with Dr. Excell Seltzer  Plan for surveillance colonoscopy (history of colonic polyps) 2019.  Office visit here in one month.  I have  explained the above recommended bowel regimen is subject to change depending on patient's symptom response.                         Notice: This dictation was prepared with Dragon dictation along with smaller phrase technology. Any transcriptional errors that result from this process are unintentional and may not be corrected upon review.

## 2016-07-28 ENCOUNTER — Encounter (HOSPITAL_COMMUNITY): Payer: Self-pay

## 2016-08-06 ENCOUNTER — Ambulatory Visit (INDEPENDENT_AMBULATORY_CARE_PROVIDER_SITE_OTHER): Payer: Medicare Other | Admitting: Gynecology

## 2016-08-06 ENCOUNTER — Telehealth: Payer: Self-pay | Admitting: *Deleted

## 2016-08-06 VITALS — BP 118/76

## 2016-08-06 DIAGNOSIS — K602 Anal fissure, unspecified: Secondary | ICD-10-CM

## 2016-08-06 DIAGNOSIS — R140 Abdominal distension (gaseous): Secondary | ICD-10-CM

## 2016-08-06 DIAGNOSIS — K640 First degree hemorrhoids: Secondary | ICD-10-CM

## 2016-08-06 DIAGNOSIS — N952 Postmenopausal atrophic vaginitis: Secondary | ICD-10-CM

## 2016-08-06 DIAGNOSIS — R197 Diarrhea, unspecified: Secondary | ICD-10-CM | POA: Diagnosis not present

## 2016-08-06 NOTE — Telephone Encounter (Signed)
Dr.Fontaine I was informed by [facility] that they cannot see the patient at there office, pt was requesting to be seen in Jan (see telephone encounter 11/23/15) and declined by Dr.Gessner, I was told since Dr.Gessner declined to see pt that no other provider at [facility] will see her as well.

## 2016-08-06 NOTE — Telephone Encounter (Signed)
Why did he declined to see her? Then lets try [facility] if [facility] refuses

## 2016-08-06 NOTE — Telephone Encounter (Signed)
-----   Message from [name], MD sent at 08/06/2016 11:01 AM EDT ----- Patient is having a lot of GI complaints. Had seen a gastroenterologist in [place] but is not pleased. See if we can arrange an appointment with [facility] to be seen.

## 2016-08-06 NOTE — Telephone Encounter (Signed)
Referral placed in epic, [facility] will contact pt to schedule.

## 2016-08-06 NOTE — Patient Instructions (Signed)
I recommend that you follow up with a gastroenterologist to address the GI complaints. Continue to follow up with Dr. Excell Seltzer as he recommends.

## 2016-08-06 NOTE — Progress Notes (Signed)
    Kaitlin May 1946-08-30 [id number]        70 y.o.  G2P2 presents with a number of complaints that include abdominal bloating and feeling like she has an intestinal blockage. Was started on Linzess which she feels that she has to take daily to help relieve this. Also uses Miralax intermittently.  Is having bouts of diarrhea with mucousy stools. Also notes an anal fissure that she's had previously which is now flaring because of her diarrhea. She also has noticed a small hemorrhoid that is bothersome to her.  Has prescription AnaMantle HC at home but has not been using it.  Had partial colectomy earlier this year for diverticulitis and is being followed by Dr. Excell Seltzer. Also has a history of C. difficile 1-2 years ago area No nausea or vomiting. No fever or chills. No urinary symptoms such as frequency dysuria or urgency  Past medical history,surgical history, problem list, medications, allergies, family history and social history were all reviewed and documented in the EPIC chart.  Directed ROS with pertinent positives and negatives documented in the history of present illness/assessment and plan.  Exam: [name] assistant Vitals:   08/06/16 0937  BP: 118/76   General appearance:  Normal Abdomen soft nontender without masses guarding rebound Pelvic external BUS vagina with atrophic changes. Bimanual without masses or tenderness. Rectal exam shows small fissure at 12:00 and a small thrombosed hemorrhoid at 4:00. Digital exam without evidence of internal abnormalities  Assessment/Plan:  70 y.o. G2P2 with a number of GI complaints. Status post TAH LSO in the past. Has appointment with Dr. Excell Seltzer tomorrow and will follow up with him. Recommended that she use the AnaMantle HC twice daily both internal/external to help with the fissure healing and the small hemorrhoid. Recommended she follow up with a gastroenterologist. She saw Dr. Buford Dresser 2 weeks ago. She does not feel that he is helping  her and asked if I could get her in to see a GI physician locally. We will call [facility] and arrange for an appointment. Patient had many questions all pertaining to her GI issues and I referred them to her GI physician. Will check C. difficile stool sample given her past history so this is available to her GI physician if needed.  Greater than 50% of my 25 minute appointment was spent in direct face to face counseling and coordination of care with the patient.    [name] MD, 9:55 AM 08/06/2016

## 2016-08-07 NOTE — Telephone Encounter (Signed)
I called [facility] and was told that pt will need to have records sent to them so provider can review and approve. I called pt and spoke with her about this, pt said to hold on for now she is going to get back with me once she gets her records all together. Referral be placed on hold for now, I will wait to here from patient.

## 2016-08-13 DIAGNOSIS — F419 Anxiety disorder, unspecified: Secondary | ICD-10-CM | POA: Diagnosis not present

## 2016-08-13 DIAGNOSIS — R51 Headache: Secondary | ICD-10-CM | POA: Diagnosis not present

## 2016-08-14 ENCOUNTER — Telehealth: Payer: Self-pay | Admitting: Internal Medicine

## 2016-08-14 NOTE — Telephone Encounter (Signed)
Patient called today at 1240 asking to see RMR today because she has diarrhea. She has OV with RMR for 10/10 and I offered her OV today at 230 with [facility]. She refuses to see anyone but RMR. She asked if she came in today could she still see RMR on Oct 10th. I told her if I move her OV up to something sooner I would have to cancel OV for 10/10 that I can't hold 2 spots like that. She wanted to know if RMR could call her and give her recommendations on what she should do about her diarrhea. Please call patient back at [phone]

## 2016-08-14 NOTE — Telephone Encounter (Signed)
I spoke with the pt- she said for the past 2-3 days she has been having a lot of clear, mucous drainage coming from her rectum. Pt took 2 linzess 35mcg, fiber and MOM today and finally had a bm that was diarrhea. Only had a small spot of blood with wiping. No fever, some discomfort at times that comes and goes. No new medications or anitbiotics. She does have a little bit of clear seepage during the day and has to wear a pad. She normally has the clear mucous, then will have a small pencil shaped stool followed by diarrhea. Pt is concerned and wants to know what RMR would recommend. She is aware it will be tomorrow before I can get back with her and is ok with that.

## 2016-08-15 DIAGNOSIS — K219 Gastro-esophageal reflux disease without esophagitis: Secondary | ICD-10-CM | POA: Diagnosis not present

## 2016-08-15 DIAGNOSIS — F419 Anxiety disorder, unspecified: Secondary | ICD-10-CM | POA: Diagnosis not present

## 2016-08-15 DIAGNOSIS — S40212A Abrasion of left shoulder, initial encounter: Secondary | ICD-10-CM | POA: Diagnosis not present

## 2016-08-15 DIAGNOSIS — I10 Essential (primary) hypertension: Secondary | ICD-10-CM | POA: Diagnosis not present

## 2016-08-15 NOTE — Telephone Encounter (Signed)
If her stools are loose, would stop Linzess until loose stools pass and then resume

## 2016-08-19 NOTE — Telephone Encounter (Signed)
Spoke with the pt. She is not any better. Moved her appt to tomorrow at 8:30 with RMR.

## 2016-08-19 NOTE — Telephone Encounter (Signed)
Kaitlin May, can you get records from [place] please.

## 2016-08-20 ENCOUNTER — Ambulatory Visit (INDEPENDENT_AMBULATORY_CARE_PROVIDER_SITE_OTHER): Payer: Medicare Other | Admitting: Internal Medicine

## 2016-08-20 ENCOUNTER — Encounter: Payer: Self-pay | Admitting: General Practice

## 2016-08-20 ENCOUNTER — Encounter: Payer: Self-pay | Admitting: Internal Medicine

## 2016-08-20 VITALS — BP 126/75 | HR 98 | Temp 97.6°F | Ht 68.0 in | Wt 164.6 lb

## 2016-08-20 DIAGNOSIS — K219 Gastro-esophageal reflux disease without esophagitis: Secondary | ICD-10-CM

## 2016-08-20 DIAGNOSIS — K5730 Diverticulosis of large intestine without perforation or abscess without bleeding: Secondary | ICD-10-CM

## 2016-08-20 DIAGNOSIS — K582 Mixed irritable bowel syndrome: Secondary | ICD-10-CM

## 2016-08-20 NOTE — Telephone Encounter (Signed)
Release was faxed today for [facility] records.

## 2016-08-20 NOTE — Patient Instructions (Signed)
Try nitroglycerin .125 % ointment  - apply a peas-sized amount to the anorectum three times a day - compound at [employer] or [employer]  Let me know how this is working for you in 7 days.  As discussed, you desire to obtain your GI care elsewhere.  Continue Prevacid daily; Use Linzess 72 every other day as needed for constipation - do not take this medication when you are having periods of diarrhea.  You should have Dr. Woody Seller refer you to another GI doctor to meet your needs

## 2016-08-20 NOTE — Telephone Encounter (Signed)
Do I still need to get these records?

## 2016-08-20 NOTE — Telephone Encounter (Signed)
Yes please get them so we have them for our records. Thanks.

## 2016-08-20 NOTE — Progress Notes (Signed)
Primary Care Physician:  Glenda Chroman, MD Primary Gastroenterologist:  Dr. Gala Romney  Pre-Procedure History & Physical: HPI:  Kaitlin May is a 70 y.o. female here for history of sigmoid diverticulitis  -  status post sigmoid colectomy earlier this year. History of constipation predominant irritable bowel syndrome.Marland Kitchen Uncomplicated post operative course. Continues to have alternating constipation and diarrhea. Stool diary presented. She reports a tendency towards diarrhea recently. We instructed her last week to hold Linzess - was taking 72 mcg every other day. Saw Dr. Phineas Real recently. She requested that she be sent to another a GI Dr. She also made that request back in January, however, has not seen anyone locally aside from Dr. Britta Mccreedy in [place] when she was hospitalized previously with diverticulitis. I sent her Dr. Creed Copper at [facility] recently. She was seen over there. No additional recommendations or insight made.  Diagnosis and treatment plans on this end reaffirmed. She's actually gained 3 pounds since seen here previously.  She is fretful - concerned now things are going through her GI tract too fast. No dysphagia since she had her Schatzki's ring dilated. GERD well-controlled on Prevacid 30 mg daily. We reviewed EGD / colonoscopy and CT findings along with operative findings as part of her extensive workup over the past year or so. She was found to have a small thrombosed hemorrhoid anal fissure when seen by Dr. Phineas Real recently. She was prescribed hydrocodone/ Xylocaine ointment as she describes  - but was afraid to use it. She was given a container to collect stool for C. Difficile specimen by Dr. Phineas Real but hasn't had diarrhea and has not followed through on it either. She does take Benefiber twice daily. Not taking hydrocodone anymore.  Past Medical History:  Diagnosis Date  . Acoustic neuroma (HCC)    in situ  . Adenoma of large intestine 03/25/11   tcs by Dr. Gala Romney  . C. difficile  colitis   . Diverticula of colon   . Diverticulitis     Negative CTs in 2004, 2009, 2011, 2013. Positive for sigmoid diverticulitis in 03/2009  . GERD (gastroesophageal reflux disease)   . HTN (hypertension)   . Migraines    uses demerol and phenergan for migraines due to severity of pain, nausea  . Rectal fistula   . Seasonal allergies     Past Surgical History:  Procedure Laterality Date  . ABDOMINAL HYSTERECTOMY     endometrial hyperplasia, irreg bleeding  . APPENDECTOMY    . COLONOSCOPY  03/25/11   Dr. Elwyn Reach sided diverticula, normal rectum, tubular adenoma. Due for next TCS 03/2018.   Marland Kitchen COLONOSCOPY WITH PROPOFOL N/A 07/13/2015   Dr.Jeanetta Alonzo- marked left colon colonic diverticulosis. no evidence of colonic neoplasm. single colonic polyp at the splenic flexure. bx= tubular adenoma  . ESOPHAGEAL DILATION N/A 07/13/2015   Procedure: ESOPHAGEAL DILATION [place], [place];  Surgeon: Daneil Dolin, MD;  Location: [facility];  Service: Endoscopy;  Laterality: N/A;  . ESOPHAGOGASTRODUODENOSCOPY (EGD) WITH PROPOFOL N/A 07/13/2015   Dr.English Craighead- prominent schatzki's ring, o/w normal appearing esohagus, hiatal hernia, multiple benign-appearing gastric polyps bx= benign fundic gland polyp  . LAPAROSCOPIC SIGMOID COLECTOMY N/A 05/10/2016   Procedure: LAPAROSCOPIC SIGMOID COLECTOMY;  Surgeon: Excell Seltzer, MD;  Location: [facility];  Service: General;  Laterality: N/A;  . OOPHORECTOMY     LSO  . POLYPECTOMY N/A 07/13/2015   Procedure: POLYPECTOMY;  Surgeon: Daneil Dolin, MD;  Location: [facility];  Service: Endoscopy;  Laterality: N/A;  .  TONSILLECTOMY      Prior to Admission medications   Medication Sig Start Date End Date Taking? Authorizing Provider  butalbital-acetaminophen-caffeine (FIORICET, ESGIC) 50-325-40 MG per tablet Take by mouth 2 (two) times daily as needed for headache. ONLY AS NEEDED FOR MIGRAINES   Yes Historical Provider, MD  dicyclomine (BENTYL) 20 MG tablet Take 20 mg by mouth 2  (two) times daily as needed for spasms.   Yes Historical Provider, MD  diltiazem 2 % GEL Apply 1 application topically 2 (two) times daily as needed (fisures).   Yes Historical Provider, MD  lansoprazole (PREVACID) 30 MG capsule Take 30 mg by mouth daily.  04/18/15  Yes Historical Provider, MD  Linaclotide Rolan Lipa) 145 MCG CAPS capsule Take 1 capsule (145 mcg total) by mouth daily. On empty stomach. Patient taking differently: Take 145 mcg by mouth daily.  06/28/15  Yes Mahala Menghini, PA-C  LORazepam (ATIVAN) 1 MG tablet Take 1 mg by mouth 3 (three) times daily as needed for anxiety.    Yes Historical Provider, MD  ondansetron (ZOFRAN) 4 MG tablet Take 4 mg by mouth 3 (three) times daily.  04/08/16  Yes Historical Provider, MD  polyethylene glycol (MIRALAX / GLYCOLAX) packet Take 17 g by mouth daily as needed for mild constipation.    Yes Historical Provider, MD  promethazine (PHENERGAN) 25 MG tablet Take 25 mg by mouth every 6 (six) hours as needed for nausea or vomiting.  09/04/15  Yes Historical Provider, MD  HYDROcodone-acetaminophen (NORCO/VICODIN) 5-325 MG tablet Take 1 tablet by mouth 2 (two) times daily as needed for moderate pain.  04/13/16   Historical Provider, MD    Allergies as of 08/20/2016 - Review Complete 08/20/2016  Allergen Reaction Noted  . Other Shortness Of Breath 05/01/2016  . Dilaudid [hydromorphone hcl] Nausea And Vomiting 07/10/2015  . Prednisone Palpitations 01/18/2015    Family History  Problem Relation Age of Onset  . Melanoma Father     deceased at 70  . Diabetes Mother   . Dementia Mother     deceased at 23  . Diabetes Brother   . Heart failure Brother   . Colon cancer Neg Hx     Social History   Social History  . Marital status: Married    Spouse name: N/A  . Number of children: N/A  . Years of education: N/A   Occupational History  . Not on file.   Social History Main Topics  . Smoking status: Never Smoker  . Smokeless tobacco: Never Used      Comment: Never smoked  . Alcohol use No  . Drug use: No  . Sexual activity: Not Currently    Partners: Male    Birth control/ protection:      Comment: 1st intercourse 27 yo-1 partner   Other Topics Concern  . Not on file   Social History Narrative  . No narrative on file    Review of Systems: See HPI, otherwise negative ROS  Physical Exam: BP 126/75   Pulse 98   Temp 97.6 F (36.4 C) (Oral)   Ht _0  (1.727 m)   Wt 164 lb 9.6 oz (74.7 kg)   BMI 25.03 kg/m  General:   Alert, quite anxious, pleasant cooperative and oriented. Accompanied by her husband. Neck:  Supple; no masses or thyromegaly. No significant cervical adenopathy. Lungs:  Clear throughout to auscultation.   No wheezes, crackles, or rhonchi. No acute distress. Heart:  Regular rate and rhythm; no murmurs, clicks,  rubs,  or gallops. Abdomen: Non-distended, normal bowel sounds.  Soft and nontender without appreciable mass or hepatosplenomegaly.  Pulses:  Normal pulses noted. Extremities:  Without clubbing or edema. Rectal: small chronically thrombosed hemorrhoid tag. I believe she does have a small fissure at the 6 clock position. DRE not particularly tender. No masses rectal vault;  scant brown stool Hemoccult negative.   Impression: Pleasant 70 year old lady with multiple GI issues including irritable bowel syndrome, GERD history of recurrent diverticulitis requiring sigmoid colectomy. She has a small anal fissure and thrombosed hemorrhoid.  He's had an extensive evaluation here. She had uncomplicated sigmoid colectomy earlier in the year. She saw Dr. Creed Copper in the GI department at [facility] earlier this year for second opinion.  She continues to be unhappy and frustrated about her myriad of bowel complaints. She is seeking out a GI care elsewhere.  We had a frank discussion about her desires to be seen elsewhere.  Recommendations:  Try nitroglycerin .125 % ointment  - apply a peas-sized amount to  the anorectum three times a day - compound at [employer] or [employer].  Admonished about potential side effects.  Patient to let us know how this is working for you in 7 days.  Continue Prevacid daily; Use Linzess 72 every other day as needed for constipation - do not take this medication when you are having periods of diarrhea.  As discussed, you desire to obtain your GI care elsewhere.  You should have Dr. Woody Seller refer you to another GI doctor to meet your needs        Notice: This dictation was prepared with Dragon dictation along with smaller phrase technology. Any transcriptional errors that result from this process are unintentional and may not be corrected upon review.

## 2016-08-27 ENCOUNTER — Ambulatory Visit: Payer: Medicare Other | Admitting: Internal Medicine

## 2016-08-29 DIAGNOSIS — K5900 Constipation, unspecified: Secondary | ICD-10-CM | POA: Diagnosis not present

## 2016-08-29 DIAGNOSIS — Z9049 Acquired absence of other specified parts of digestive tract: Secondary | ICD-10-CM | POA: Diagnosis not present

## 2016-08-29 DIAGNOSIS — Z6826 Body mass index (BMI) 26.0-26.9, adult: Secondary | ICD-10-CM | POA: Diagnosis not present

## 2016-08-29 DIAGNOSIS — Z299 Encounter for prophylactic measures, unspecified: Secondary | ICD-10-CM | POA: Diagnosis not present

## 2016-08-29 DIAGNOSIS — Z713 Dietary counseling and surveillance: Secondary | ICD-10-CM | POA: Diagnosis not present

## 2016-09-06 DIAGNOSIS — F419 Anxiety disorder, unspecified: Secondary | ICD-10-CM | POA: Diagnosis not present

## 2016-09-06 DIAGNOSIS — Z299 Encounter for prophylactic measures, unspecified: Secondary | ICD-10-CM | POA: Diagnosis not present

## 2016-09-06 DIAGNOSIS — I10 Essential (primary) hypertension: Secondary | ICD-10-CM | POA: Diagnosis not present

## 2016-09-06 DIAGNOSIS — K589 Irritable bowel syndrome without diarrhea: Secondary | ICD-10-CM | POA: Diagnosis not present

## 2016-09-12 DIAGNOSIS — E7800 Pure hypercholesterolemia, unspecified: Secondary | ICD-10-CM | POA: Diagnosis not present

## 2016-09-12 DIAGNOSIS — I10 Essential (primary) hypertension: Secondary | ICD-10-CM | POA: Diagnosis not present

## 2016-09-16 DIAGNOSIS — K589 Irritable bowel syndrome without diarrhea: Secondary | ICD-10-CM | POA: Diagnosis not present

## 2016-09-16 DIAGNOSIS — R109 Unspecified abdominal pain: Secondary | ICD-10-CM | POA: Diagnosis not present

## 2016-09-16 DIAGNOSIS — Z6826 Body mass index (BMI) 26.0-26.9, adult: Secondary | ICD-10-CM | POA: Diagnosis not present

## 2016-09-16 DIAGNOSIS — Z713 Dietary counseling and surveillance: Secondary | ICD-10-CM | POA: Diagnosis not present

## 2016-09-16 DIAGNOSIS — R1030 Lower abdominal pain, unspecified: Secondary | ICD-10-CM | POA: Diagnosis not present

## 2016-09-16 DIAGNOSIS — Z299 Encounter for prophylactic measures, unspecified: Secondary | ICD-10-CM | POA: Diagnosis not present

## 2016-09-19 DIAGNOSIS — R140 Abdominal distension (gaseous): Secondary | ICD-10-CM | POA: Diagnosis not present

## 2016-09-19 DIAGNOSIS — Z8619 Personal history of other infectious and parasitic diseases: Secondary | ICD-10-CM | POA: Diagnosis not present

## 2016-09-19 DIAGNOSIS — Z8719 Personal history of other diseases of the digestive system: Secondary | ICD-10-CM | POA: Diagnosis not present

## 2016-09-19 DIAGNOSIS — Z86010 Personal history of colonic polyps: Secondary | ICD-10-CM | POA: Diagnosis not present

## 2016-09-19 DIAGNOSIS — K582 Mixed irritable bowel syndrome: Secondary | ICD-10-CM | POA: Diagnosis not present

## 2016-09-27 ENCOUNTER — Other Ambulatory Visit: Payer: Self-pay | Admitting: Gastroenterology

## 2016-09-27 ENCOUNTER — Ambulatory Visit
Admission: RE | Admit: 2016-09-27 | Discharge: 2016-09-27 | Disposition: A | Discharge status: Home / Self Care | Payer: Medicare Other | Source: Ambulatory Visit | Attending: Gastroenterology | Admitting: Gastroenterology

## 2016-09-27 DIAGNOSIS — K5900 Constipation, unspecified: Secondary | ICD-10-CM

## 2016-09-27 DIAGNOSIS — R110 Nausea: Secondary | ICD-10-CM

## 2016-09-27 DIAGNOSIS — R1084 Generalized abdominal pain: Secondary | ICD-10-CM

## 2016-09-30 ENCOUNTER — Ambulatory Visit
Admission: RE | Admit: 2016-09-30 | Discharge: 2016-09-30 | Disposition: A | Discharge status: Home / Self Care | Payer: Medicare Other | Source: Ambulatory Visit | Attending: Gastroenterology | Admitting: Gastroenterology

## 2016-09-30 DIAGNOSIS — K5900 Constipation, unspecified: Secondary | ICD-10-CM | POA: Diagnosis not present

## 2016-09-30 DIAGNOSIS — R1084 Generalized abdominal pain: Secondary | ICD-10-CM

## 2016-09-30 MED ORDER — IOPAMIDOL (ISOVUE-300) INJECTION 61%
100.0000 mL | Freq: Once | INTRAVENOUS | Status: AC | PRN
  Administered 2016-09-30: 100 mL via INTRAVENOUS

## 2016-10-03 DIAGNOSIS — H2513 Age-related nuclear cataract, bilateral: Secondary | ICD-10-CM | POA: Diagnosis not present

## 2016-10-03 DIAGNOSIS — H1131 Conjunctival hemorrhage, right eye: Secondary | ICD-10-CM | POA: Diagnosis not present

## 2016-10-15 DIAGNOSIS — Z8619 Personal history of other infectious and parasitic diseases: Secondary | ICD-10-CM | POA: Diagnosis not present

## 2016-10-15 DIAGNOSIS — K581 Irritable bowel syndrome with constipation: Secondary | ICD-10-CM | POA: Diagnosis not present

## 2016-10-15 DIAGNOSIS — Z8719 Personal history of other diseases of the digestive system: Secondary | ICD-10-CM | POA: Diagnosis not present

## 2016-10-15 DIAGNOSIS — R140 Abdominal distension (gaseous): Secondary | ICD-10-CM | POA: Diagnosis not present

## 2016-10-15 DIAGNOSIS — E7800 Pure hypercholesterolemia, unspecified: Secondary | ICD-10-CM | POA: Diagnosis not present

## 2016-10-15 DIAGNOSIS — Z86010 Personal history of colonic polyps: Secondary | ICD-10-CM | POA: Diagnosis not present

## 2016-10-15 DIAGNOSIS — I10 Essential (primary) hypertension: Secondary | ICD-10-CM | POA: Diagnosis not present

## 2016-10-28 ENCOUNTER — Encounter (HOSPITAL_COMMUNITY)
Admission: RE | Disposition: A | Discharge status: Home / Self Care | Payer: Self-pay | Source: Ambulatory Visit | Attending: Gastroenterology

## 2016-10-28 ENCOUNTER — Ambulatory Visit (HOSPITAL_COMMUNITY)
Admission: RE | Admit: 2016-10-28 | Discharge: 2016-10-28 | Disposition: A | Discharge status: Home / Self Care | Payer: Medicare Other | Source: Ambulatory Visit | Attending: Gastroenterology | Admitting: Gastroenterology

## 2016-10-28 SURGERY — CANCELLED PROCEDURE

## 2016-10-31 ENCOUNTER — Telehealth: Payer: Self-pay | Admitting: *Deleted

## 2016-10-31 DIAGNOSIS — M8580 Other specified disorders of bone density and structure, unspecified site: Secondary | ICD-10-CM

## 2016-10-31 NOTE — Telephone Encounter (Signed)
Pt called requesting to have Dexa next year scheduled at this office, last done in [place] at imaging center. Are you okay with this? Please advise

## 2016-11-01 NOTE — Telephone Encounter (Signed)
Pt informed order placed.

## 2016-11-01 NOTE — Telephone Encounter (Signed)
Okay 

## 2016-11-05 DIAGNOSIS — R109 Unspecified abdominal pain: Secondary | ICD-10-CM | POA: Diagnosis not present

## 2016-11-05 DIAGNOSIS — R51 Headache: Secondary | ICD-10-CM | POA: Diagnosis not present

## 2016-11-05 DIAGNOSIS — R110 Nausea: Secondary | ICD-10-CM | POA: Diagnosis not present

## 2016-11-05 DIAGNOSIS — J069 Acute upper respiratory infection, unspecified: Secondary | ICD-10-CM | POA: Diagnosis not present

## 2016-11-05 DIAGNOSIS — Z299 Encounter for prophylactic measures, unspecified: Secondary | ICD-10-CM | POA: Diagnosis not present

## 2016-11-06 ENCOUNTER — Ambulatory Visit (HOSPITAL_COMMUNITY)
Admission: RE | Admit: 2016-11-06 | Discharge: 2016-11-06 | Disposition: A | Discharge status: Home / Self Care | Payer: Medicare Other | Source: Ambulatory Visit | Attending: Gastroenterology | Admitting: Gastroenterology

## 2016-11-06 ENCOUNTER — Encounter (HOSPITAL_COMMUNITY)
Admission: RE | Disposition: A | Discharge status: Home / Self Care | Payer: Self-pay | Source: Ambulatory Visit | Attending: Gastroenterology

## 2016-11-06 DIAGNOSIS — K5909 Other constipation: Secondary | ICD-10-CM | POA: Insufficient documentation

## 2016-11-06 HISTORY — PX: ANAL RECTAL MANOMETRY: SHX6358

## 2016-11-06 SURGERY — MANOMETRY, ANORECTAL

## 2016-11-06 NOTE — OR Nursing (Signed)
Anal rectal manometry done per protocol.  Pt. Tolerated well without complication.  Study to be sent to Dr. Marcello Moores to be read.  Laverta Baltimore, RN

## 2016-11-07 ENCOUNTER — Encounter (HOSPITAL_COMMUNITY): Payer: Self-pay | Admitting: Gastroenterology

## 2016-11-07 NOTE — H&P (Signed)
Patient does not  require H and P from me for ano- rectal manometry.  Otis Brace, MD Rosalita Chessman

## 2016-11-13 DIAGNOSIS — K5900 Constipation, unspecified: Secondary | ICD-10-CM | POA: Diagnosis not present

## 2016-11-14 DIAGNOSIS — Z1211 Encounter for screening for malignant neoplasm of colon: Secondary | ICD-10-CM | POA: Diagnosis not present

## 2016-11-14 DIAGNOSIS — Z1389 Encounter for screening for other disorder: Secondary | ICD-10-CM | POA: Diagnosis not present

## 2016-11-14 DIAGNOSIS — Z299 Encounter for prophylactic measures, unspecified: Secondary | ICD-10-CM | POA: Diagnosis not present

## 2016-11-14 DIAGNOSIS — E7800 Pure hypercholesterolemia, unspecified: Secondary | ICD-10-CM | POA: Diagnosis not present

## 2016-11-14 DIAGNOSIS — Z0000 Encounter for general adult medical examination without abnormal findings: Secondary | ICD-10-CM | POA: Diagnosis not present

## 2016-11-14 DIAGNOSIS — Z789 Other specified health status: Secondary | ICD-10-CM | POA: Diagnosis not present

## 2016-11-14 DIAGNOSIS — I10 Essential (primary) hypertension: Secondary | ICD-10-CM | POA: Diagnosis not present

## 2016-11-14 DIAGNOSIS — Z7189 Other specified counseling: Secondary | ICD-10-CM | POA: Diagnosis not present

## 2016-11-15 DIAGNOSIS — E7800 Pure hypercholesterolemia, unspecified: Secondary | ICD-10-CM | POA: Diagnosis not present

## 2016-11-15 DIAGNOSIS — Z79899 Other long term (current) drug therapy: Secondary | ICD-10-CM | POA: Diagnosis not present

## 2016-11-15 DIAGNOSIS — R5383 Other fatigue: Secondary | ICD-10-CM | POA: Diagnosis not present

## 2016-11-18 DIAGNOSIS — M810 Age-related osteoporosis without current pathological fracture: Secondary | ICD-10-CM

## 2016-11-18 HISTORY — DX: Age-related osteoporosis without current pathological fracture: M81.0

## 2016-11-26 DIAGNOSIS — G43909 Migraine, unspecified, not intractable, without status migrainosus: Secondary | ICD-10-CM | POA: Diagnosis not present

## 2016-11-28 DIAGNOSIS — E7800 Pure hypercholesterolemia, unspecified: Secondary | ICD-10-CM | POA: Diagnosis not present

## 2016-11-28 DIAGNOSIS — I10 Essential (primary) hypertension: Secondary | ICD-10-CM | POA: Diagnosis not present

## 2016-12-02 DIAGNOSIS — K581 Irritable bowel syndrome with constipation: Secondary | ICD-10-CM | POA: Diagnosis not present

## 2016-12-02 DIAGNOSIS — Z86010 Personal history of colonic polyps: Secondary | ICD-10-CM | POA: Diagnosis not present

## 2016-12-02 DIAGNOSIS — Z8619 Personal history of other infectious and parasitic diseases: Secondary | ICD-10-CM | POA: Diagnosis not present

## 2016-12-02 DIAGNOSIS — R140 Abdominal distension (gaseous): Secondary | ICD-10-CM | POA: Diagnosis not present

## 2016-12-02 DIAGNOSIS — Z8719 Personal history of other diseases of the digestive system: Secondary | ICD-10-CM | POA: Diagnosis not present

## 2016-12-04 IMAGING — CT CT ABD-PELV W/ CM
2 of 5 series · 16 of 46 positions shown, 18 images · IV contrast (Omnipaque 300)
Comparison: 03/31/2015 and prior CTs

CLINICAL DATA: 68-year-old female with generalized abdominal and
pelvic pain for 2 weeks. Recently started on antibiotics for
recurrent diverticulitis but no improvement and pain, nausea or
vomiting.

EXAM:
CT ABDOMEN AND PELVIS WITH CONTRAST
TECHNIQUE: Multidetector CT imaging of the abdomen and pelvis was performed
using the standard protocol following bolus administration of
intravenous contrast.
CONTRAST:  100mL OMNIPAQUE IOHEXOL 300 MG/ML  SOLN

[Series 2: abd_pel_with 5.0 b40f · axial · 0.73mm/px · z∈[-432,-42]mm · 13 of 88 slices shown, 15 images]
[im 5/88  soft-tissue]
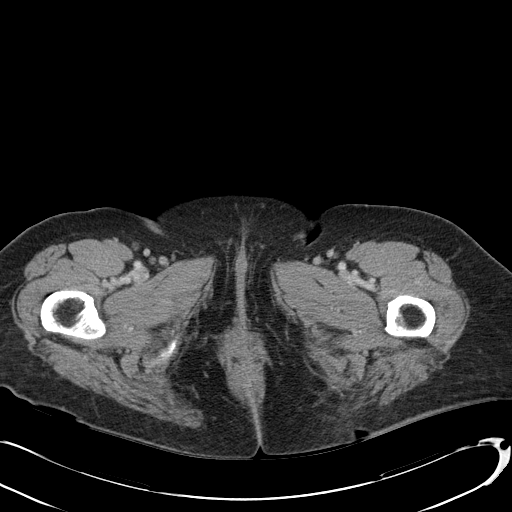
[im 5/88  bone]
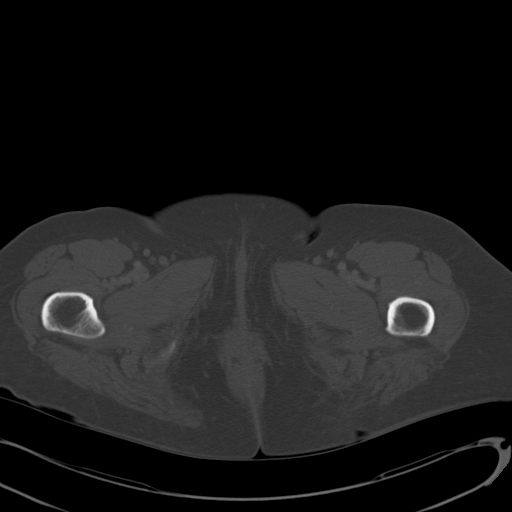
[im 10/88  soft-tissue]
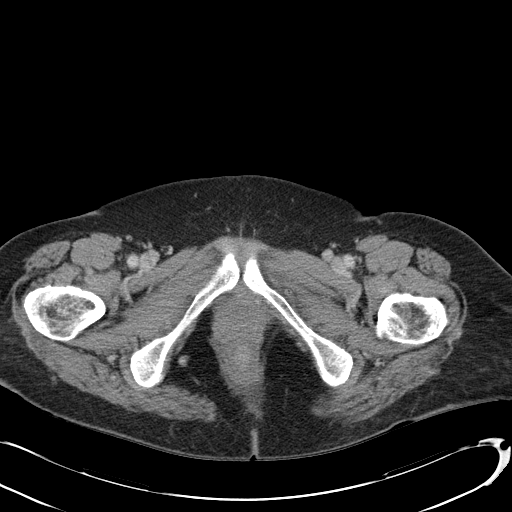
[im 20/88  soft-tissue]
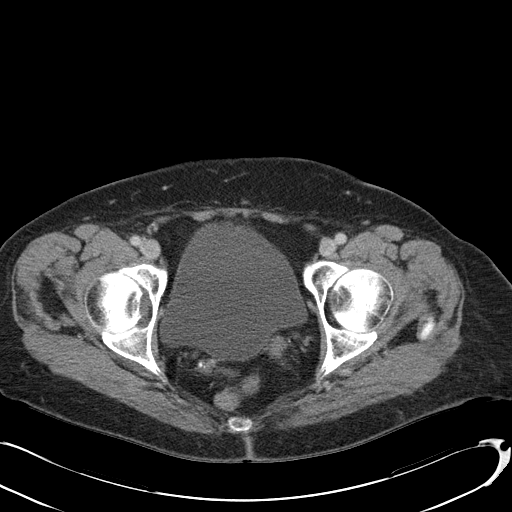
[im 25/88  soft-tissue]
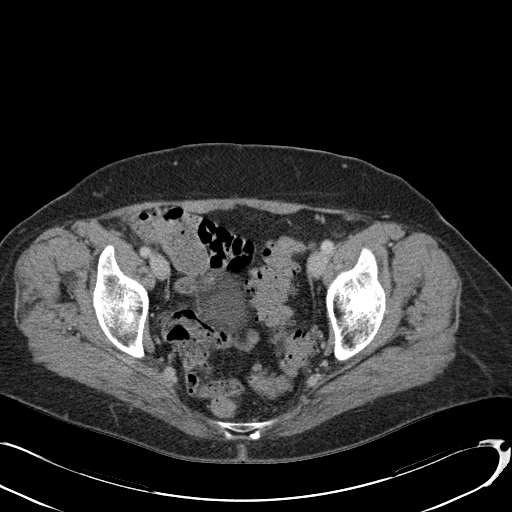
[im 30/88  soft-tissue]
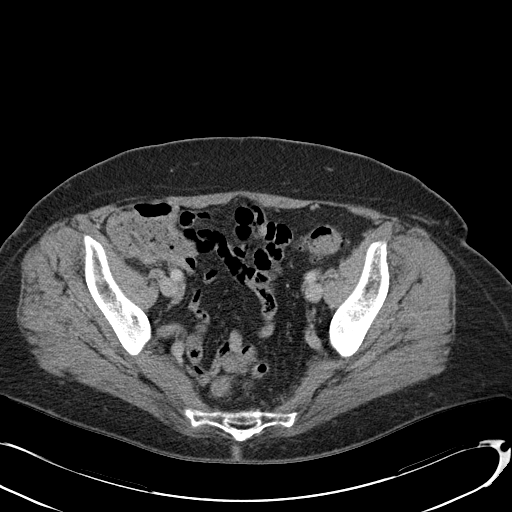
[im 39/88  soft-tissue]
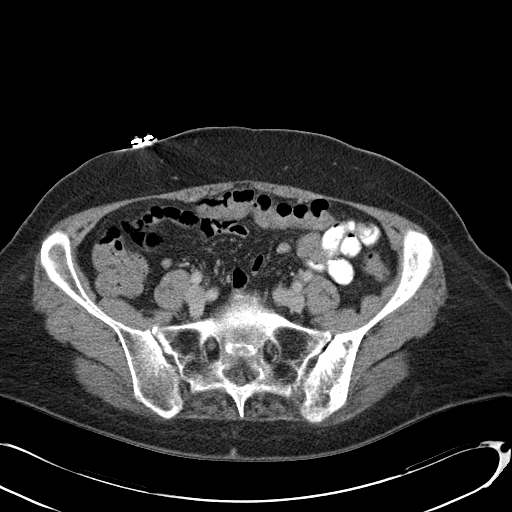
[im 44/88  soft-tissue]
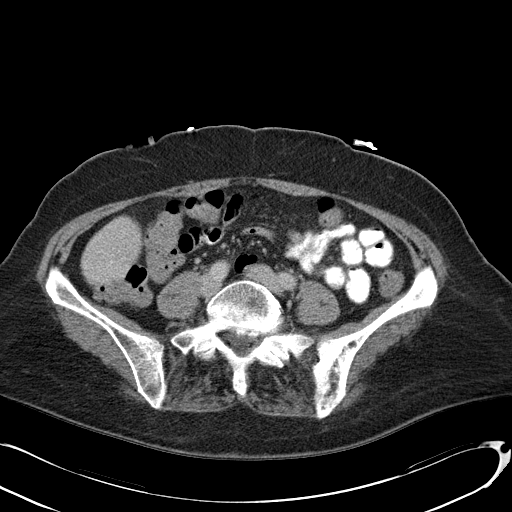
[im 49/88  soft-tissue]
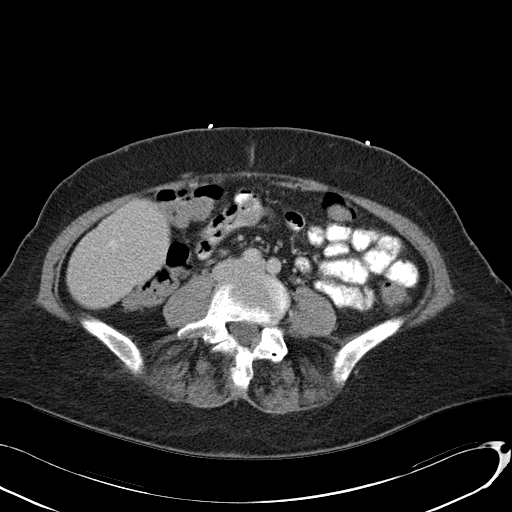
[im 59/88  soft-tissue]
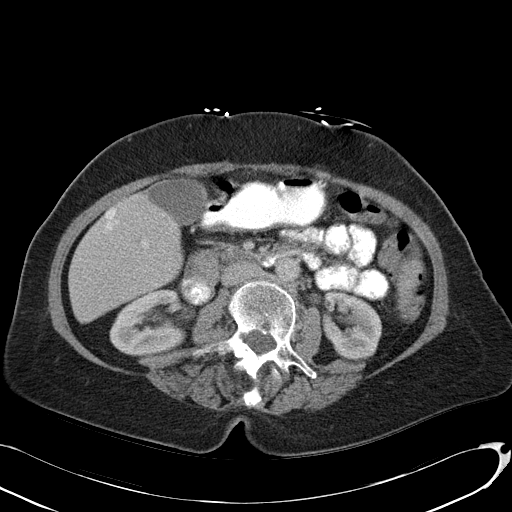
[im 59/88  bone]
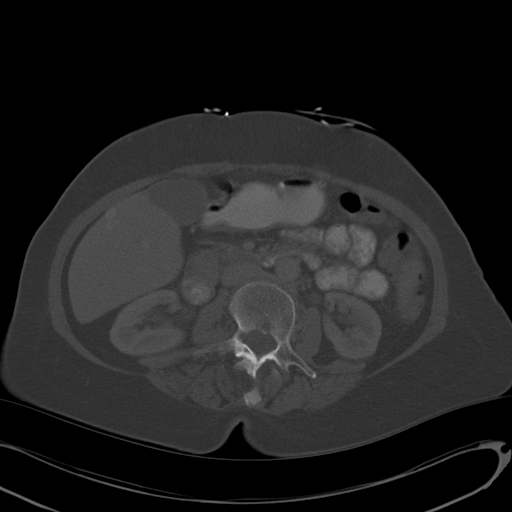
[im 63/88  soft-tissue]
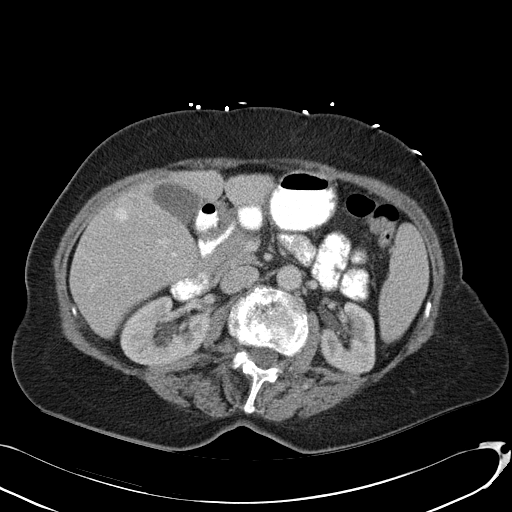
[im 68/88  soft-tissue]
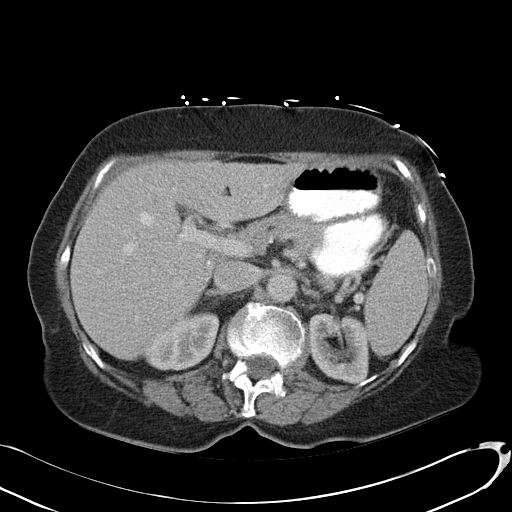
[im 78/88  soft-tissue]
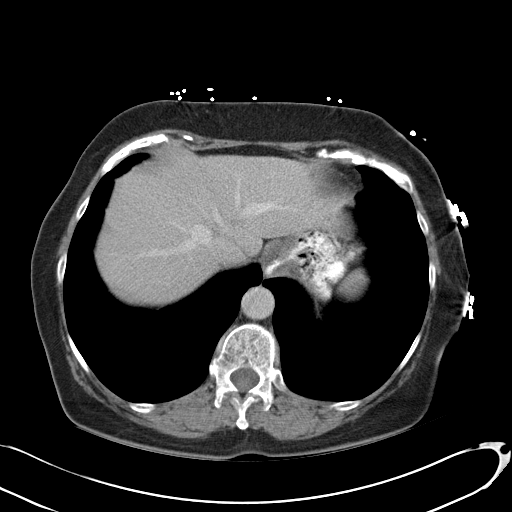
[im 83/88  soft-tissue]
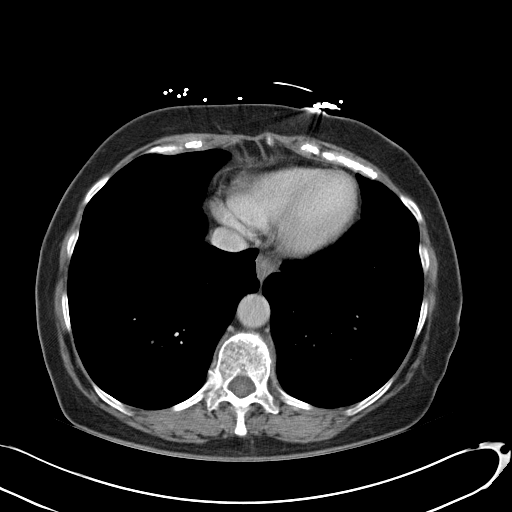

[Series 4: abd_pel_with 3.0 spo cor · coronal · 0.70mm/px · 3 of 71 slices shown]
[im 24/71  soft-tissue]
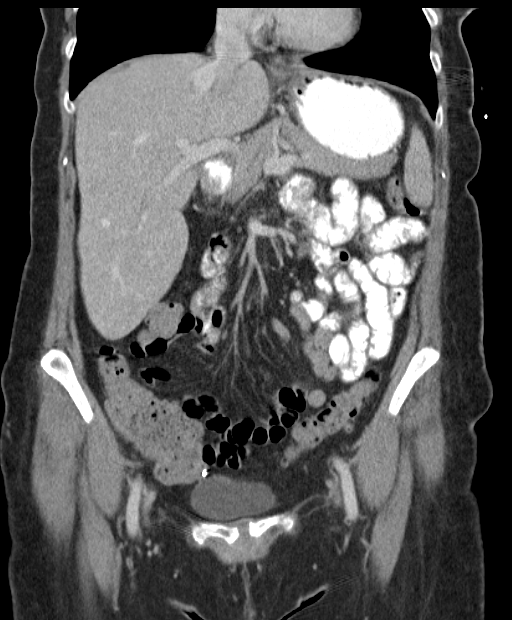
[im 32/71  soft-tissue]
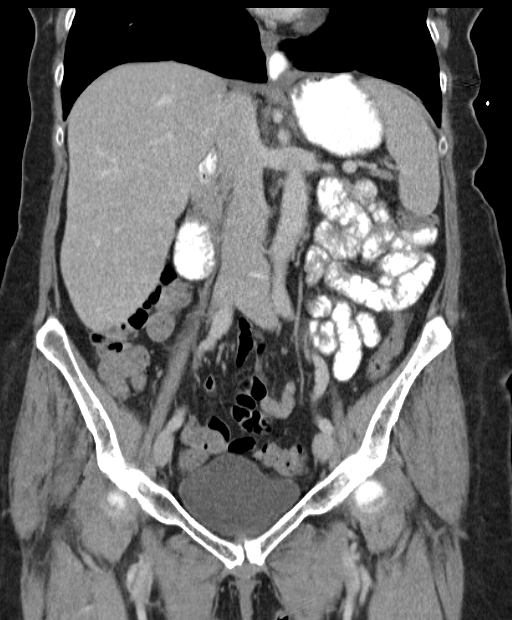
[im 39/71  soft-tissue]
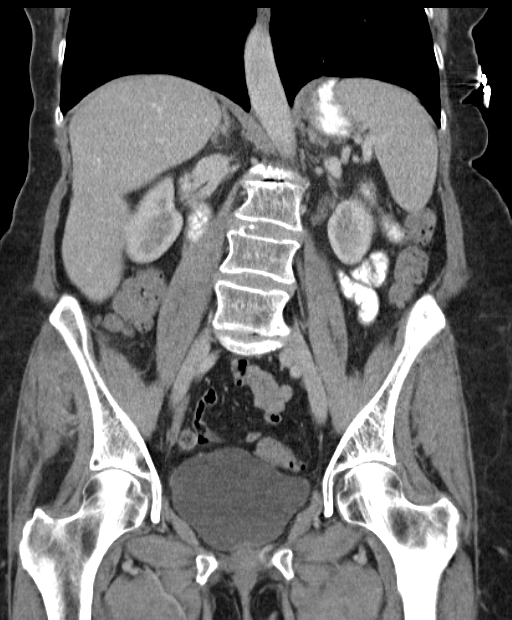

[16 of 46 positions shown; findings below may reference images not displayed]

FINDINGS: Lower chest:  Unremarkable

Hepatobiliary: The liver and gallbladder are unremarkable. There is
no evidence of biliary dilatation.

Pancreas: Unremarkable

Spleen: Unremarkable

Adrenals/Urinary Tract: Mild bilateral renal cortical thinning
noted. The kidneys, adrenal glands and bladder are otherwise
unremarkable.

Stomach/Bowel: There has been interval resolution of inflammation
and wall thickening of the sigmoid colon. Descending and sigmoid
colonic diverticulosis noted without evidence of diverticulitis.
There is no evidence of focal wall thickening or bowel obstruction.
A small hiatal hernia is noted.

Vascular/Lymphatic: Unremarkable. No enlarged lymph nodes or
abdominal aortic aneurysm.

Reproductive: The patient is status post hysterectomy. There are no
adnexal masses identified.

Other: No free fluid, abscess or pneumoperitoneum.

Musculoskeletal: Changes of Paget's disease within the left iliac
bone again noted. Degenerative changes and scoliosis of the lumbar
spine again noted. No acute or suspicious bony abnormalities are
identified.
IMPRESSION: Interval resolution of sigmoid diverticulitis from the prior study.
No evidence of acute abnormality.

Small hiatal hernia.

## 2016-12-10 ENCOUNTER — Other Ambulatory Visit: Payer: Self-pay | Admitting: Gynecology

## 2016-12-10 ENCOUNTER — Ambulatory Visit (INDEPENDENT_AMBULATORY_CARE_PROVIDER_SITE_OTHER): Payer: Medicare Other

## 2016-12-10 DIAGNOSIS — M818 Other osteoporosis without current pathological fracture: Secondary | ICD-10-CM

## 2016-12-10 DIAGNOSIS — M810 Age-related osteoporosis without current pathological fracture: Secondary | ICD-10-CM | POA: Diagnosis not present

## 2016-12-10 DIAGNOSIS — M8580 Other specified disorders of bone density and structure, unspecified site: Secondary | ICD-10-CM

## 2016-12-11 ENCOUNTER — Telehealth: Payer: Self-pay | Admitting: Gynecology

## 2016-12-11 ENCOUNTER — Encounter: Payer: Self-pay | Admitting: Gynecology

## 2016-12-11 NOTE — Telephone Encounter (Signed)
Tell patient that her most recent bone density is consistent with osteoporosis. Recommend office visit to discuss treatment options.

## 2016-12-11 NOTE — Telephone Encounter (Signed)
Pt informed with the below note, transferred to front desk to schedule.

## 2016-12-12 ENCOUNTER — Ambulatory Visit (INDEPENDENT_AMBULATORY_CARE_PROVIDER_SITE_OTHER): Payer: Medicare Other | Admitting: Gynecology

## 2016-12-12 ENCOUNTER — Encounter: Payer: Self-pay | Admitting: Gynecology

## 2016-12-12 VITALS — BP 122/74

## 2016-12-12 DIAGNOSIS — M810 Age-related osteoporosis without current pathological fracture: Secondary | ICD-10-CM | POA: Diagnosis not present

## 2016-12-12 MED ORDER — ALENDRONATE SODIUM 70 MG PO TABS: 70.0000 mg | ORAL_TABLET | ORAL | 11 refills | Status: AC

## 2016-12-12 NOTE — Patient Instructions (Signed)
Start on the Fosamax as we discussed. Call me if you have any issues.  Alendronate tablets What is this medicine? ALENDRONATE (a LEN droe nate) slows calcium loss from bones. It helps to make normal healthy bone and to slow bone loss in people with Paget's disease and osteoporosis. It may be used in others at risk for bone loss. This medicine may be used for other purposes; ask your health care provider or pharmacist if you have questions. COMMON BRAND NAME(S): Fosamax What should I tell my health care provider before I take this medicine? They need to know if you have any of these conditions: -dental disease -esophagus, stomach, or intestine problems, like acid reflux or GERD -kidney disease -low blood calcium -low vitamin D -problems sitting or standing 30 minutes -trouble swallowing -an unusual or allergic reaction to alendronate, other medicines, foods, dyes, or preservatives -pregnant or trying to get pregnant -breast-feeding How should I use this medicine? You must take this medicine exactly as directed or you will lower the amount of the medicine you absorb into your body or you may cause yourself harm. Take this medicine by mouth first thing in the morning, after you are up for the day. Do not eat or drink anything before you take your medicine. Swallow the tablet with a full glass (6 to 8 fluid ounces) of plain water. Do not take this medicine with any other drink. Do not chew or crush the tablet. After taking this medicine, do not eat breakfast, drink, or take any medicines or vitamins for at least 30 minutes. Sit or stand up for at least 30 minutes after you take this medicine; do not lie down. Do not take your medicine more often than directed. Talk to your pediatrician regarding the use of this medicine in children. Special care may be needed. Overdosage: If you think you have taken too much of this medicine contact a poison control center or emergency room at once. NOTE: This  medicine is only for you. Do not share this medicine with others. What if I miss a dose? If you miss a dose, do not take it later in the day. Continue your normal schedule starting the next morning. Do not take double or extra doses. What may interact with this medicine? -aluminum hydroxide -antacids -aspirin -calcium supplements -drugs for inflammation like ibuprofen, naproxen, and others -iron supplements -magnesium supplements -vitamins with minerals This list may not describe all possible interactions. Give your health care provider a list of all the medicines, herbs, non-prescription drugs, or dietary supplements you use. Also tell them if you smoke, drink alcohol, or use illegal drugs. Some items may interact with your medicine. What should I watch for while using this medicine? Visit your doctor or health care professional for regular checks ups. It may be some time before you see benefit from this medicine. Do not stop taking your medicine except on your doctor's advice. Your doctor or health care professional may order blood tests and other tests to see how you are doing. You should make sure you get enough calcium and vitamin D while you are taking this medicine, unless your doctor tells you not to. Discuss the foods you eat and the vitamins you take with your health care professional. Some people who take this medicine have severe bone, joint, and/or muscle pain. This medicine may also increase your risk for a broken thigh bone. Tell your doctor right away if you have pain in your upper leg or groin. Tell your  doctor if you have any pain that does not go away or that gets worse. This medicine can make you more sensitive to the sun. If you get a rash while taking this medicine, sunlight may cause the rash to get worse. Keep out of the sun. If you cannot avoid being in the sun, wear protective clothing and use sunscreen. Do not use sun lamps or tanning beds/booths. What side effects may I  notice from receiving this medicine? Side effects that you should report to your doctor or health care professional as soon as possible: -allergic reactions like skin rash, itching or hives, swelling of the face, lips, or tongue -black or tarry stools -bone, muscle or joint pain -changes in vision -chest pain -heartburn or stomach pain -jaw pain, especially after dental work -pain or trouble when swallowing -redness, blistering, peeling or loosening of the skin, including inside the mouth Side effects that usually do not require medical attention (report to your doctor or health care professional if they continue or are bothersome): -changes in taste -diarrhea or constipation -eye pain or itching -headache -nausea or vomiting -stomach gas or fullness This list may not describe all possible side effects. Call your doctor for medical advice about side effects. You may report side effects to FDA at 1-800-FDA-1088. Where should I keep my medicine? Keep out of the reach of children. Store at room temperature of 15 and 30 degrees C (59 and 86 degrees F). Throw away any unused medicine after the expiration date. NOTE: This sheet is a summary. It may not cover all possible information. If you have questions about this medicine, talk to your doctor, pharmacist, or health care provider.  2017 Elsevier/Gold Standard (2011-05-03 08:56:09)

## 2016-12-12 NOTE — Progress Notes (Signed)
    Kaitlin May [birth year] [id number]        70 y.o.  G2P2 presents with her husband to discuss her most recent DEXA which shows osteoporosis T score -2.8 [facility] spine. All other sites measured were osteopenic. This T-scores normal. Recent lab work brought with her shows a normal calcium of 9.3 and a normal TSH. Has not had a vitamin D level checked by her history  Past medical history,surgical history, problem list, medications, allergies, family history and social history were all reviewed and documented in the EPIC chart.  Directed ROS with pertinent positives and negatives documented in the history of present illness/assessment and plan.  Exam: Vitals:   12/12/16 1001  BP: 122/74   General appearance:  Normal   Assessment/Plan:  71 y.o. G2P2 With newly diagnosed osteoporosis. I reviewed the situation with the patient and her husband. Increased risk of fracture reviewed with potential for devastating sequelae. Options for management discussed. Maximizing calcium and vitamin D as well as exercising on a regular basis reviewed. Fall prevention measures discussed. Medication treatment options also reviewed to include bisphosphonates, Prolia, Evista. Risks reviewed to include GERD, osteonecrosis of the jaw, atypical tractors particularly with prolonged use rashes and infections. Patient does have some issue with GERD. After lengthy discussion she wants a trial of alendronate recognizing it may exacerbate her GERD. If she does well with this then she will continue. If she does have issues them will plan on Prolia. We'll check vitamin D level today. Prescription for alendronate provided.  Greater than 50% of my time was spent in direct face to face counseling and coordination of care with the patient.     Kaitlin Auerbach MD, 10:28 AM 12/12/2016

## 2016-12-13 ENCOUNTER — Other Ambulatory Visit: Payer: Self-pay | Admitting: Gynecology

## 2016-12-13 LAB — VITAMIN D 25 HYDROXY (VIT D DEFICIENCY, FRACTURES): Vit D, 25-Hydroxy: 14 ng/mL — ABNORMAL LOW (ref 30–100)

## 2016-12-13 MED ORDER — VITAMIN D (ERGOCALCIFEROL) 1.25 MG (50000 UNIT) PO CAPS: 50000.0000 [IU] | ORAL_CAPSULE | ORAL | 0 refills | Status: DC

## 2016-12-27 DIAGNOSIS — K219 Gastro-esophageal reflux disease without esophagitis: Secondary | ICD-10-CM | POA: Diagnosis not present

## 2016-12-27 DIAGNOSIS — Z299 Encounter for prophylactic measures, unspecified: Secondary | ICD-10-CM | POA: Diagnosis not present

## 2016-12-27 DIAGNOSIS — E663 Overweight: Secondary | ICD-10-CM | POA: Diagnosis not present

## 2016-12-27 DIAGNOSIS — R109 Unspecified abdominal pain: Secondary | ICD-10-CM | POA: Diagnosis not present

## 2016-12-27 DIAGNOSIS — R110 Nausea: Secondary | ICD-10-CM | POA: Diagnosis not present

## 2017-01-14 DIAGNOSIS — E7800 Pure hypercholesterolemia, unspecified: Secondary | ICD-10-CM | POA: Diagnosis not present

## 2017-01-14 DIAGNOSIS — I10 Essential (primary) hypertension: Secondary | ICD-10-CM | POA: Diagnosis not present

## 2017-01-15 ENCOUNTER — Encounter: Payer: Self-pay | Admitting: Gynecology

## 2017-01-15 ENCOUNTER — Ambulatory Visit (INDEPENDENT_AMBULATORY_CARE_PROVIDER_SITE_OTHER): Payer: Medicare Other | Admitting: Gynecology

## 2017-01-15 VITALS — BP 124/80 | Ht 68.0 in | Wt 177.0 lb

## 2017-01-15 DIAGNOSIS — Z01411 Encounter for gynecological examination (general) (routine) with abnormal findings: Secondary | ICD-10-CM | POA: Diagnosis not present

## 2017-01-15 DIAGNOSIS — N952 Postmenopausal atrophic vaginitis: Secondary | ICD-10-CM | POA: Diagnosis not present

## 2017-01-15 DIAGNOSIS — M810 Age-related osteoporosis without current pathological fracture: Secondary | ICD-10-CM | POA: Diagnosis not present

## 2017-01-15 NOTE — Progress Notes (Signed)
    Kaitlin May [birth year] [id number]        71 y.o.  G2P2 for breast and pelvic exam. Patient also wants to rediscuss her osteoporosis and treatment options.  Past medical history,surgical history, problem list, medications, allergies, family history and social history were all reviewed and documented as reviewed in the EPIC chart.  ROS:  Performed with pertinent positives and negatives included in the history, assessment and plan.   Additional significant findings :  None   Exam: Kaitlin May assistant Vitals:   01/15/17 0931  BP: 124/80  Weight: 177 lb (80.3 kg)  Height: 5\' 8"  (1.727 m)   Body mass index is 26.91 kg/m.  General appearance:  Normal affect, orientation and appearance. Skin: Grossly normal HEENT: Without gross lesions.  No cervical or supraclavicular adenopathy. Thyroid normal.  Lungs:  Clear without wheezing, rales or rhonchi Cardiac: RR, without RMG Abdominal:  Soft, nontender, without masses, guarding, rebound, organomegaly or hernia Breasts:  Examined lying and sitting without masses, retractions, discharge or axillary adenopathy. Pelvic:  Ext, BUS, Vagina: With atrophic changes  Adnexa: Without masses or tenderness    Anus and perineum: Normal   Rectovaginal: Normal sphincter tone without palpated masses or tenderness.    Assessment/Plan:  71 y.o. G2P2 female for breast and pelvic exam. Status post TAH, LSO for bleeding and questionable endometrial hyperplasia in the past.  1. Osteoporosis. DEXA 11/2016 T score -2.8. Previously prescribed alendronate but she never started this for fear of gastrointestinal symptoms. She does have a history of bloating and reflux. She started on vitamin D 50,000 units weekly and is taking that now. She is to have a follow up vitamin D level checked whenever she completes the course. I reviewed again with her in detail all the treatment options to include Prolia possibility. Risks to include osteonecrosis of the jaw,  atypical fractures, rashes and increased risk of infections discussed. 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 Possible flare of infection discussed. At this point I strongly recommended patient give a trial of alendronate to see how she does. If she does have GI affects she will call and we'll consider starting Prolia. Patient agrees with the plan. She has a supply at home of the alendronate. 2. Postmenopausal/atrophic genital changes. No significant hot flushes, night sweats, vaginal dryness. 3. History of anal fissure. Doing well using OTC products. No evidence of fissure today. 4. Colonoscopy 2016. Follow up at her gastroenterologist's recommendation. 5. Mammography coming due in April and I reminded her to schedule this. SBE monthly reviewed. 6. Pap smear 2016. No Pap smear done today. No history of significant abnormal Pap smears. Options to stop screening per current screening guidelines reviewed based on age and hysterectomy history. 7. Health maintenance. No routine lab work done as this is done at her primary physician's office. Follow up 1 year, sooner if any issues with her alendronate.  Additional time in excess of her breast and pelvic exam was spent in direct face to face counseling and coordination of care in regards to her osteoporosis, treatment options and ultimate decision to initiate alendronate.    [name] MD, 9:58 AM 01/15/2017

## 2017-01-15 NOTE — Patient Instructions (Signed)
Try the Fosamax as we discussed. Call me if you have any issues with this from your gastrointestinal tract.

## 2017-01-17 ENCOUNTER — Telehealth: Payer: Self-pay

## 2017-01-17 NOTE — Telephone Encounter (Addendum)
You had recommended she give the Alendronate a try.  Patient said every morning when she gets up her stomach is sick and she takes a Phenergan before she eats.  She also have to take a Prevacid 30 minutes prior to eating.  She asked how she would work the Alendronate with these meds?  Pt. Informed that Dr. Loetta Rough off today and I will call her Monday with recommendation.

## 2017-01-20 NOTE — Telephone Encounter (Signed)
Patient informed. She wants to think about the Reclast and she will let us know.

## 2017-01-20 NOTE — Telephone Encounter (Signed)
I would then recommend Reclast annually if she is willing to proceed with this then we can arrange for this.

## 2017-01-20 NOTE — Telephone Encounter (Signed)
Left message to call.

## 2017-02-03 DIAGNOSIS — M19071 Primary osteoarthritis, right ankle and foot: Secondary | ICD-10-CM | POA: Diagnosis not present

## 2017-02-03 DIAGNOSIS — M19072 Primary osteoarthritis, left ankle and foot: Secondary | ICD-10-CM | POA: Diagnosis not present

## 2017-02-03 DIAGNOSIS — F419 Anxiety disorder, unspecified: Secondary | ICD-10-CM | POA: Diagnosis not present

## 2017-02-03 DIAGNOSIS — Z6828 Body mass index (BMI) 28.0-28.9, adult: Secondary | ICD-10-CM | POA: Diagnosis not present

## 2017-02-03 DIAGNOSIS — Z713 Dietary counseling and surveillance: Secondary | ICD-10-CM | POA: Diagnosis not present

## 2017-02-03 DIAGNOSIS — I10 Essential (primary) hypertension: Secondary | ICD-10-CM | POA: Diagnosis not present

## 2017-02-03 DIAGNOSIS — R51 Headache: Secondary | ICD-10-CM | POA: Diagnosis not present

## 2017-02-03 DIAGNOSIS — R110 Nausea: Secondary | ICD-10-CM | POA: Diagnosis not present

## 2017-02-25 DIAGNOSIS — E7800 Pure hypercholesterolemia, unspecified: Secondary | ICD-10-CM | POA: Diagnosis not present

## 2017-02-25 DIAGNOSIS — I10 Essential (primary) hypertension: Secondary | ICD-10-CM | POA: Diagnosis not present

## 2017-03-04 DIAGNOSIS — J069 Acute upper respiratory infection, unspecified: Secondary | ICD-10-CM | POA: Diagnosis not present

## 2017-03-04 DIAGNOSIS — Z299 Encounter for prophylactic measures, unspecified: Secondary | ICD-10-CM | POA: Diagnosis not present

## 2017-03-04 DIAGNOSIS — Z6828 Body mass index (BMI) 28.0-28.9, adult: Secondary | ICD-10-CM | POA: Diagnosis not present

## 2017-03-04 DIAGNOSIS — I10 Essential (primary) hypertension: Secondary | ICD-10-CM | POA: Diagnosis not present

## 2017-03-04 DIAGNOSIS — Z713 Dietary counseling and surveillance: Secondary | ICD-10-CM | POA: Diagnosis not present

## 2017-03-07 DIAGNOSIS — R05 Cough: Secondary | ICD-10-CM | POA: Diagnosis not present

## 2017-03-07 DIAGNOSIS — R918 Other nonspecific abnormal finding of lung field: Secondary | ICD-10-CM | POA: Diagnosis not present

## 2017-03-10 DIAGNOSIS — J209 Acute bronchitis, unspecified: Secondary | ICD-10-CM | POA: Diagnosis not present

## 2017-03-10 DIAGNOSIS — F419 Anxiety disorder, unspecified: Secondary | ICD-10-CM | POA: Diagnosis not present

## 2017-03-10 DIAGNOSIS — D333 Benign neoplasm of cranial nerves: Secondary | ICD-10-CM | POA: Diagnosis not present

## 2017-03-10 DIAGNOSIS — I10 Essential (primary) hypertension: Secondary | ICD-10-CM | POA: Diagnosis not present

## 2017-03-10 DIAGNOSIS — Z6827 Body mass index (BMI) 27.0-27.9, adult: Secondary | ICD-10-CM | POA: Diagnosis not present

## 2017-03-10 DIAGNOSIS — Z299 Encounter for prophylactic measures, unspecified: Secondary | ICD-10-CM | POA: Diagnosis not present

## 2017-03-12 DIAGNOSIS — R110 Nausea: Secondary | ICD-10-CM | POA: Diagnosis not present

## 2017-03-12 DIAGNOSIS — Z713 Dietary counseling and surveillance: Secondary | ICD-10-CM | POA: Diagnosis not present

## 2017-03-12 DIAGNOSIS — Z6827 Body mass index (BMI) 27.0-27.9, adult: Secondary | ICD-10-CM | POA: Diagnosis not present

## 2017-03-12 DIAGNOSIS — Z299 Encounter for prophylactic measures, unspecified: Secondary | ICD-10-CM | POA: Diagnosis not present

## 2017-03-18 DIAGNOSIS — R140 Abdominal distension (gaseous): Secondary | ICD-10-CM | POA: Diagnosis not present

## 2017-03-18 DIAGNOSIS — K5909 Other constipation: Secondary | ICD-10-CM | POA: Diagnosis not present

## 2017-03-20 ENCOUNTER — Other Ambulatory Visit: Payer: Medicare Other

## 2017-03-20 ENCOUNTER — Other Ambulatory Visit: Payer: Self-pay | Admitting: *Deleted

## 2017-03-20 DIAGNOSIS — E559 Vitamin D deficiency, unspecified: Secondary | ICD-10-CM | POA: Diagnosis not present

## 2017-03-21 LAB — VITAMIN D 25 HYDROXY (VIT D DEFICIENCY, FRACTURES): Vit D, 25-Hydroxy: 42 ng/mL (ref 30–100)

## 2017-03-25 DIAGNOSIS — R278 Other lack of coordination: Secondary | ICD-10-CM | POA: Diagnosis not present

## 2017-03-25 DIAGNOSIS — M6281 Muscle weakness (generalized): Secondary | ICD-10-CM | POA: Diagnosis not present

## 2017-03-25 DIAGNOSIS — K5900 Constipation, unspecified: Secondary | ICD-10-CM | POA: Diagnosis not present

## 2017-04-07 DIAGNOSIS — E7800 Pure hypercholesterolemia, unspecified: Secondary | ICD-10-CM | POA: Diagnosis not present

## 2017-04-07 DIAGNOSIS — I10 Essential (primary) hypertension: Secondary | ICD-10-CM | POA: Diagnosis not present

## 2017-04-16 DIAGNOSIS — N816 Rectocele: Secondary | ICD-10-CM | POA: Diagnosis not present

## 2017-04-16 DIAGNOSIS — K5909 Other constipation: Secondary | ICD-10-CM | POA: Diagnosis not present

## 2017-04-16 DIAGNOSIS — N815 Vaginal enterocele: Secondary | ICD-10-CM | POA: Diagnosis not present

## 2017-04-22 DIAGNOSIS — I10 Essential (primary) hypertension: Secondary | ICD-10-CM | POA: Diagnosis not present

## 2017-04-22 DIAGNOSIS — R51 Headache: Secondary | ICD-10-CM | POA: Diagnosis not present

## 2017-04-22 DIAGNOSIS — F419 Anxiety disorder, unspecified: Secondary | ICD-10-CM | POA: Diagnosis not present

## 2017-04-22 DIAGNOSIS — Z6827 Body mass index (BMI) 27.0-27.9, adult: Secondary | ICD-10-CM | POA: Diagnosis not present

## 2017-04-22 DIAGNOSIS — Z299 Encounter for prophylactic measures, unspecified: Secondary | ICD-10-CM | POA: Diagnosis not present

## 2017-04-28 DIAGNOSIS — K649 Unspecified hemorrhoids: Secondary | ICD-10-CM | POA: Diagnosis not present

## 2017-04-28 DIAGNOSIS — K469 Unspecified abdominal hernia without obstruction or gangrene: Secondary | ICD-10-CM | POA: Diagnosis not present

## 2017-04-28 DIAGNOSIS — M62838 Other muscle spasm: Secondary | ICD-10-CM | POA: Diagnosis not present

## 2017-04-29 DIAGNOSIS — R278 Other lack of coordination: Secondary | ICD-10-CM | POA: Diagnosis not present

## 2017-04-29 DIAGNOSIS — M6281 Muscle weakness (generalized): Secondary | ICD-10-CM | POA: Diagnosis not present

## 2017-04-29 DIAGNOSIS — K5900 Constipation, unspecified: Secondary | ICD-10-CM | POA: Diagnosis not present

## 2017-05-06 DIAGNOSIS — G43909 Migraine, unspecified, not intractable, without status migrainosus: Secondary | ICD-10-CM | POA: Diagnosis not present

## 2017-05-06 DIAGNOSIS — I10 Essential (primary) hypertension: Secondary | ICD-10-CM | POA: Diagnosis not present

## 2017-05-06 DIAGNOSIS — Z299 Encounter for prophylactic measures, unspecified: Secondary | ICD-10-CM | POA: Diagnosis not present

## 2017-05-06 DIAGNOSIS — R110 Nausea: Secondary | ICD-10-CM | POA: Diagnosis not present

## 2017-05-06 DIAGNOSIS — Z6827 Body mass index (BMI) 27.0-27.9, adult: Secondary | ICD-10-CM | POA: Diagnosis not present

## 2017-05-06 DIAGNOSIS — F419 Anxiety disorder, unspecified: Secondary | ICD-10-CM | POA: Diagnosis not present

## 2017-05-29 DIAGNOSIS — K5900 Constipation, unspecified: Secondary | ICD-10-CM | POA: Diagnosis not present

## 2017-05-29 DIAGNOSIS — M6281 Muscle weakness (generalized): Secondary | ICD-10-CM | POA: Diagnosis not present

## 2017-05-29 DIAGNOSIS — R278 Other lack of coordination: Secondary | ICD-10-CM | POA: Diagnosis not present

## 2017-07-01 DIAGNOSIS — R140 Abdominal distension (gaseous): Secondary | ICD-10-CM | POA: Diagnosis not present

## 2017-07-01 DIAGNOSIS — K5909 Other constipation: Secondary | ICD-10-CM | POA: Diagnosis not present

## 2017-07-01 DIAGNOSIS — R109 Unspecified abdominal pain: Secondary | ICD-10-CM | POA: Diagnosis not present

## 2017-07-01 DIAGNOSIS — R278 Other lack of coordination: Secondary | ICD-10-CM | POA: Diagnosis not present

## 2017-07-01 DIAGNOSIS — K5900 Constipation, unspecified: Secondary | ICD-10-CM | POA: Diagnosis not present

## 2017-07-01 DIAGNOSIS — M6281 Muscle weakness (generalized): Secondary | ICD-10-CM | POA: Diagnosis not present

## 2017-07-08 DIAGNOSIS — G43909 Migraine, unspecified, not intractable, without status migrainosus: Secondary | ICD-10-CM | POA: Diagnosis not present

## 2017-07-08 DIAGNOSIS — Z713 Dietary counseling and surveillance: Secondary | ICD-10-CM | POA: Diagnosis not present

## 2017-07-08 DIAGNOSIS — K5900 Constipation, unspecified: Secondary | ICD-10-CM | POA: Diagnosis not present

## 2017-07-08 DIAGNOSIS — E663 Overweight: Secondary | ICD-10-CM | POA: Diagnosis not present

## 2017-07-08 DIAGNOSIS — I10 Essential (primary) hypertension: Secondary | ICD-10-CM | POA: Diagnosis not present

## 2017-07-08 DIAGNOSIS — R110 Nausea: Secondary | ICD-10-CM | POA: Diagnosis not present

## 2017-07-08 DIAGNOSIS — Z299 Encounter for prophylactic measures, unspecified: Secondary | ICD-10-CM | POA: Diagnosis not present

## 2017-07-08 DIAGNOSIS — E7800 Pure hypercholesterolemia, unspecified: Secondary | ICD-10-CM | POA: Diagnosis not present

## 2017-07-08 DIAGNOSIS — F419 Anxiety disorder, unspecified: Secondary | ICD-10-CM | POA: Diagnosis not present

## 2017-07-16 DIAGNOSIS — E7800 Pure hypercholesterolemia, unspecified: Secondary | ICD-10-CM | POA: Diagnosis not present

## 2017-07-16 DIAGNOSIS — I10 Essential (primary) hypertension: Secondary | ICD-10-CM | POA: Diagnosis not present

## 2017-07-24 ENCOUNTER — Encounter: Payer: Self-pay | Admitting: Gynecology

## 2017-07-24 ENCOUNTER — Ambulatory Visit (INDEPENDENT_AMBULATORY_CARE_PROVIDER_SITE_OTHER): Payer: Medicare Other | Admitting: Gynecology

## 2017-07-24 VITALS — BP 134/80

## 2017-07-24 DIAGNOSIS — R3989 Other symptoms and signs involving the genitourinary system: Secondary | ICD-10-CM

## 2017-07-24 DIAGNOSIS — N898 Other specified noninflammatory disorders of vagina: Secondary | ICD-10-CM | POA: Diagnosis not present

## 2017-07-24 LAB — WET PREP FOR TRICH, YEAST, CLUE

## 2017-07-24 LAB — URINALYSIS W MICROSCOPIC + REFLEX CULTURE
BACTERIA UA: NONE SEEN /HPF
Bilirubin Urine: NEGATIVE
Glucose, UA: NEGATIVE
HYALINE CAST: NONE SEEN /LPF
Hgb urine dipstick: NEGATIVE
Ketones, ur: NEGATIVE
Leukocyte Esterase: NEGATIVE
Nitrites, Initial: NEGATIVE
PH: 7 (ref 5.0–8.0)
Protein, ur: NEGATIVE
RBC / HPF: NONE SEEN /HPF (ref 0–2)
SPECIFIC GRAVITY, URINE: 1.01 (ref 1.001–1.03)
WBC, UA: NONE SEEN /HPF (ref 0–5)

## 2017-07-24 LAB — NO CULTURE INDICATED

## 2017-07-24 NOTE — Addendum Note (Signed)
Addended by: Nelva Nay on: 07/24/2017 10:40 AM   Modules accepted: Orders

## 2017-07-24 NOTE — Progress Notes (Signed)
    Kaitlin May Nov 02, 1946 [id number]        71 y.o.   presents complaining of several weeks of vaginal discomfort with irritation.  Also complaining of bladder pressure. No frequency, dysuria, urgency, low back pain or fever or chills. Recently underwent evaluation at [facility] with a number of tests per her history. From what I can gather she sorry your gynecologist who is recommending a vaginal support type surgery with mesh due to rectocele and enterocele. I have not seen these records to review. Patient was asking me many questions pertaining to what they said.  Past medical history,surgical history, problem list, medications, allergies, family history and social history were all reviewed and documented in the EPIC chart.  Directed ROS with pertinent positives and negatives documented in the history of present illness/assessment and plan.  Exam: [name] assistant Vitals:   07/24/17 0941  BP: 134/80   General appearance:  Normal Abdomen soft nontender without masses guarding rebound Pelvic external BUS vagina with atrophic changes. Mild enterocele and mild rectocele noted. No significant cystocele. Bimanual without masses or tenderness. Rectal exam confirms mild rectocele.  Assessment/Plan:  71 y.o. G2P2 with vaginal burning and irritation and bladder pressure. Her urine analysis is negative. Her wet prep also is negative. Exam without evidence of overt infection. She does relate having similar symptoms years ago and she used Monistat cream which helped. I recommended OTC Monistat now and see if this doesn't help to resolve her symptoms. I discussed with her that I did not have the records to review to discuss what they told her at [facility]. I did discuss with her though her symptoms of main complaint are nausea abdominal bloating and abdominal discomfort. Also having constipation. I discussed with her that these do not sound like signs of enterocele and rectocele and I am unsure that surgery  for the enterocele rectocele is going to relieve her GI symptoms. She does not appear to be bothered by vaginal pressure or something protruding from the vagina. I did tell her that I will review her records through care everywhere and see if I can see what they did and what they told her and then I will follow up with her. I also strongly recommended she continue to follow up with gastroenterology in reference to her GI complaints.    [name] MD, 10:21 AM 07/24/2017

## 2017-07-24 NOTE — Patient Instructions (Signed)
Use the over-the-counter Monistat vaginal cream to see if this doesn't help with the irritation.

## 2017-07-25 ENCOUNTER — Telehealth: Payer: Self-pay | Admitting: Gynecology

## 2017-07-25 NOTE — Telephone Encounter (Signed)
Tell patient I reviewed her outside records. From the studies that she had done it appears that she does have some enterocele where the intestines bulge down on the upper part of the vagina and rectocele where the rectum bulges into the posterior aspect of the vagina, but I am not sure that is causing her abdominal symptoms. Usually not associated with nausea vomiting and abdominal bloating. Enteroceles and rectoceles normally would cause pressure in the vagina symptoms. I still feel given her history that her abdominal symptoms are probably more GI related and not gynecologic.  I'm not sure that if she had the surgery that would relieve her symptoms.

## 2017-07-28 NOTE — Telephone Encounter (Signed)
LEFT MESSAGE FOR PT TO CALL.

## 2017-07-28 NOTE — Telephone Encounter (Signed)
Pt informed with the below. 

## 2017-08-21 DIAGNOSIS — I10 Essential (primary) hypertension: Secondary | ICD-10-CM | POA: Diagnosis not present

## 2017-08-21 DIAGNOSIS — E7800 Pure hypercholesterolemia, unspecified: Secondary | ICD-10-CM | POA: Diagnosis not present

## 2017-09-19 DIAGNOSIS — G43909 Migraine, unspecified, not intractable, without status migrainosus: Secondary | ICD-10-CM | POA: Diagnosis not present

## 2017-09-19 DIAGNOSIS — Z713 Dietary counseling and surveillance: Secondary | ICD-10-CM | POA: Diagnosis not present

## 2017-09-19 DIAGNOSIS — R110 Nausea: Secondary | ICD-10-CM | POA: Diagnosis not present

## 2017-09-19 DIAGNOSIS — K5900 Constipation, unspecified: Secondary | ICD-10-CM | POA: Diagnosis not present

## 2017-09-19 DIAGNOSIS — R51 Headache: Secondary | ICD-10-CM | POA: Diagnosis not present

## 2017-09-19 DIAGNOSIS — F419 Anxiety disorder, unspecified: Secondary | ICD-10-CM | POA: Diagnosis not present

## 2017-09-19 DIAGNOSIS — Z6828 Body mass index (BMI) 28.0-28.9, adult: Secondary | ICD-10-CM | POA: Diagnosis not present

## 2017-09-19 DIAGNOSIS — Z299 Encounter for prophylactic measures, unspecified: Secondary | ICD-10-CM | POA: Diagnosis not present

## 2017-09-30 DIAGNOSIS — I10 Essential (primary) hypertension: Secondary | ICD-10-CM | POA: Diagnosis not present

## 2017-09-30 DIAGNOSIS — E7800 Pure hypercholesterolemia, unspecified: Secondary | ICD-10-CM | POA: Diagnosis not present

## 2017-10-06 ENCOUNTER — Encounter: Payer: Self-pay | Admitting: Podiatry

## 2017-10-06 ENCOUNTER — Other Ambulatory Visit: Payer: Self-pay | Admitting: Podiatry

## 2017-10-06 ENCOUNTER — Ambulatory Visit (INDEPENDENT_AMBULATORY_CARE_PROVIDER_SITE_OTHER): Payer: Medicare Other

## 2017-10-06 ENCOUNTER — Ambulatory Visit (INDEPENDENT_AMBULATORY_CARE_PROVIDER_SITE_OTHER): Payer: Medicare Other | Admitting: Podiatry

## 2017-10-06 DIAGNOSIS — M722 Plantar fascial fibromatosis: Secondary | ICD-10-CM

## 2017-10-06 DIAGNOSIS — M779 Enthesopathy, unspecified: Secondary | ICD-10-CM

## 2017-10-06 DIAGNOSIS — M7751 Other enthesopathy of right foot: Secondary | ICD-10-CM | POA: Diagnosis not present

## 2017-10-06 DIAGNOSIS — L84 Corns and callosities: Secondary | ICD-10-CM | POA: Diagnosis not present

## 2017-10-06 DIAGNOSIS — M79671 Pain in right foot: Secondary | ICD-10-CM | POA: Diagnosis not present

## 2017-10-06 MED ORDER — TRIAMCINOLONE ACETONIDE 10 MG/ML IJ SUSP
10.0000 mg | Freq: Once | INTRAMUSCULAR | Status: AC
  Administered 2017-10-06: 10 mg

## 2017-10-06 NOTE — Progress Notes (Signed)
   Subjective:    Patient ID: Kaitlin May, female    DOB: 02/10/1946, 71 y.o.   MRN: [id number]  HPI    Review of Systems  All other systems reviewed and are negative.      Objective:   Physical Exam        Assessment & Plan:

## 2017-10-10 NOTE — Progress Notes (Signed)
Subjective:    Patient ID: Kaitlin May, female   DOB: 71 y.o.   MRN: [id number]   HPI patient presents stating she has a lot of pain in the plantar aspect of the right heel with inflammation fluid buildup and corn callus formation that's very tender. States it's been present for several months. Patient does not smoke and likes to be active    Review of Systems  All other systems reviewed and are negative.       Objective:  Physical Exam  Constitutional: She appears well-developed and well-nourished.  Cardiovascular: Intact distal pulses.  Pulmonary/Chest: Effort normal.  Musculoskeletal: Normal range of motion.  Neurological: She is alert.  Skin: Skin is warm.  Nursing note and vitals reviewed.  neurovascular status intact muscle strength adequate range of motion within normal limits with patient noted to have keratotic lesion sub-heel right with inflammation fluid buildup and pain with fluid around the plantar heel with irritation of the tendon itself. 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 Patient will be seen back as needed and hopefully this will reduce the inflammation pain she is experiencing  X-rays indicate spur with no indications of stress fracture advanced arthritis

## 2017-10-13 DIAGNOSIS — R278 Other lack of coordination: Secondary | ICD-10-CM | POA: Diagnosis not present

## 2017-10-13 DIAGNOSIS — M6281 Muscle weakness (generalized): Secondary | ICD-10-CM | POA: Diagnosis not present

## 2017-10-13 DIAGNOSIS — K5900 Constipation, unspecified: Secondary | ICD-10-CM | POA: Diagnosis not present

## 2017-10-29 DIAGNOSIS — G43909 Migraine, unspecified, not intractable, without status migrainosus: Secondary | ICD-10-CM | POA: Diagnosis not present

## 2017-10-29 DIAGNOSIS — Z299 Encounter for prophylactic measures, unspecified: Secondary | ICD-10-CM | POA: Diagnosis not present

## 2017-10-29 DIAGNOSIS — Z713 Dietary counseling and surveillance: Secondary | ICD-10-CM | POA: Diagnosis not present

## 2017-10-29 DIAGNOSIS — Z6828 Body mass index (BMI) 28.0-28.9, adult: Secondary | ICD-10-CM | POA: Diagnosis not present

## 2017-10-29 DIAGNOSIS — K219 Gastro-esophageal reflux disease without esophagitis: Secondary | ICD-10-CM | POA: Diagnosis not present

## 2017-11-19 DIAGNOSIS — R51 Headache: Secondary | ICD-10-CM | POA: Diagnosis not present

## 2017-11-19 DIAGNOSIS — Z79899 Other long term (current) drug therapy: Secondary | ICD-10-CM | POA: Diagnosis not present

## 2017-11-19 DIAGNOSIS — Z1339 Encounter for screening examination for other mental health and behavioral disorders: Secondary | ICD-10-CM | POA: Diagnosis not present

## 2017-11-19 DIAGNOSIS — I10 Essential (primary) hypertension: Secondary | ICD-10-CM | POA: Diagnosis not present

## 2017-11-19 DIAGNOSIS — Z1331 Encounter for screening for depression: Secondary | ICD-10-CM | POA: Diagnosis not present

## 2017-11-19 DIAGNOSIS — E7800 Pure hypercholesterolemia, unspecified: Secondary | ICD-10-CM | POA: Diagnosis not present

## 2017-11-19 DIAGNOSIS — Z7189 Other specified counseling: Secondary | ICD-10-CM | POA: Diagnosis not present

## 2017-11-19 DIAGNOSIS — R5383 Other fatigue: Secondary | ICD-10-CM | POA: Diagnosis not present

## 2017-11-19 DIAGNOSIS — Z299 Encounter for prophylactic measures, unspecified: Secondary | ICD-10-CM | POA: Diagnosis not present

## 2017-11-19 DIAGNOSIS — Z1211 Encounter for screening for malignant neoplasm of colon: Secondary | ICD-10-CM | POA: Diagnosis not present

## 2017-11-19 DIAGNOSIS — Z789 Other specified health status: Secondary | ICD-10-CM | POA: Diagnosis not present

## 2017-11-19 DIAGNOSIS — Z0000 Encounter for general adult medical examination without abnormal findings: Secondary | ICD-10-CM | POA: Diagnosis not present

## 2017-11-19 DIAGNOSIS — Z6826 Body mass index (BMI) 26.0-26.9, adult: Secondary | ICD-10-CM | POA: Diagnosis not present

## 2017-12-29 DIAGNOSIS — I10 Essential (primary) hypertension: Secondary | ICD-10-CM | POA: Diagnosis not present

## 2017-12-29 DIAGNOSIS — Z6829 Body mass index (BMI) 29.0-29.9, adult: Secondary | ICD-10-CM | POA: Diagnosis not present

## 2017-12-29 DIAGNOSIS — R51 Headache: Secondary | ICD-10-CM | POA: Diagnosis not present

## 2017-12-29 DIAGNOSIS — K219 Gastro-esophageal reflux disease without esophagitis: Secondary | ICD-10-CM | POA: Diagnosis not present

## 2017-12-29 DIAGNOSIS — E7800 Pure hypercholesterolemia, unspecified: Secondary | ICD-10-CM | POA: Diagnosis not present

## 2017-12-29 DIAGNOSIS — Z299 Encounter for prophylactic measures, unspecified: Secondary | ICD-10-CM | POA: Diagnosis not present

## 2017-12-29 DIAGNOSIS — F419 Anxiety disorder, unspecified: Secondary | ICD-10-CM | POA: Diagnosis not present

## 2018-01-16 ENCOUNTER — Ambulatory Visit (INDEPENDENT_AMBULATORY_CARE_PROVIDER_SITE_OTHER): Payer: Medicare Other | Admitting: Gynecology

## 2018-01-16 ENCOUNTER — Encounter: Payer: Self-pay | Admitting: Gynecology

## 2018-01-16 VITALS — BP 122/82 | Ht 66.0 in | Wt 185.0 lb

## 2018-01-16 DIAGNOSIS — Z01411 Encounter for gynecological examination (general) (routine) with abnormal findings: Secondary | ICD-10-CM

## 2018-01-16 DIAGNOSIS — N8189 Other female genital prolapse: Secondary | ICD-10-CM | POA: Diagnosis not present

## 2018-01-16 DIAGNOSIS — N952 Postmenopausal atrophic vaginitis: Secondary | ICD-10-CM | POA: Diagnosis not present

## 2018-01-16 DIAGNOSIS — M810 Age-related osteoporosis without current pathological fracture: Secondary | ICD-10-CM

## 2018-01-16 NOTE — Patient Instructions (Signed)
Follow-up with your gastroenterologist for ongoing care of your GI complaints.  Consider Prolia for treatment of your osteoporosis.  Call the office if you want to initiate treatment.  It involves a shot twice a year.

## 2018-01-16 NOTE — Progress Notes (Signed)
Kaitlin May Apr 09, 1946 [id number]        71 y.o.  G2P2 for breast and pelvic exam.  History of osteoporosis with prescription for alendronate provided.  She never started taking for fear of GI side effects.  Also having a lot of issues with her GI tract as discussed below.  Past medical history,surgical history, problem list, medications, allergies, family history and social history were all reviewed and documented as reviewed in the EPIC chart.  ROS:  Performed with pertinent positives and negatives included in the history, assessment and plan.   Additional significant findings : None   Exam: [name] assistant Vitals:   01/16/18 1001  BP: 122/82  Weight: 185 lb (83.9 kg)  Height: 5\' 6"  (1.676 m)   Body mass index is 29.86 kg/m.  General appearance:  Normal affect, orientation and appearance. Skin: Grossly normal HEENT: Without gross lesions.  No cervical or supraclavicular adenopathy. Thyroid normal.  Lungs:  Clear without wheezing, rales or rhonchi Cardiac: RR, without RMG Abdominal:  Soft, nontender, without masses, guarding, rebound, organomegaly or hernia Breasts:  Examined lying and sitting without masses, retractions, discharge or axillary adenopathy. Pelvic:  Ext, BUS, Vagina: With atrophic changes.  Mild rectocele/high enterocele.  Cuff well supported.  No significant cystocele.  Adnexa: Without masses or tenderness    Anus and perineum: External thrombosed hemorrhoid with older hemorrhoids.  Rectovaginal: Normal sphincter tone without palpated masses or tenderness.    Assessment/Plan:  72 y.o. G2P2 female for breast and pelvic exam status post TAH LSO in the past for bleeding and questionable endometrial hyperplasia..   1. Postmenopausal/atrophic genital changes.  No significant hot flushes, night sweats, vaginal dryness. 2. Abdominal complaints/pelvic relaxation.  Patient continues to have a lot of GI complaints to include nausea bloating constipation  alternating with diarrhea and bloody stools.  Reports having been referred to [facility].  Is still having issues despite medication trials.  Felt that correction of her pelvic relaxation/rectocele would help with her symptoms.  Saw y urogynecologist who discussed using mesh to repair her rectocele.  Patient and I discussed today extensively her symptoms and my feeling that it is primarily GI related and I am not sure correcting her mild rectocele would alleviate her symptoms.  I think she needs to be more aggressive from the GI treatment standpoint and return to the physician from [facility].  She notes this is a long way to travel and I have suggested then she follow-up with her [facility] gastroenterologist for their suggestion for ongoing management.  She does have an external thrombosed hemorrhoid which I think may contribute to bleeding with her bowel movements.  Regardless I stressed the need to follow-up with gastroenterology as I do not think that it is GYN related.  If she has ongoing questions as far as her vaginal surgery repair than she should follow-up with her urogynecologist. 3. Osteoporosis.  DEXA 2018 T score -2.8.  Had discussed treatment options in the past and elected to start alendronate.  She never started it because of fear of her GI tract.  We reviewed the diagnosis of osteoporosis and her increased fracture risk.  Given her GI symptoms I think avoiding oral medication may be prudent and I recommended that she start on Prolia.  We discussed the medication, risks and side effects to include osteonecrosis of the jaw, atypical fracture, rash and increased risk of infections.  Benefits as far as decreasing fracture risk, mildly increasing bone density and decreasing ongoing bone  loss all reviewed.  Patient is very reluctant to consider medication stating my bones do not hurt.  I reviewed that osteoporosis is asymptomatic until fracture.  Devastating sequela I of fracture reviewed.  After a lengthy  discussion the patient is reluctant to initiate any treatment at this point but would rather get her GI tract under control and then will follow-up for discussion clearly understanding the risks of nontreatment for fracture and for ongoing bone loss. 4. Mammography overdue and I reminded patient she needs to schedule this.  Breast exam normal today. 5. Pap smear 2016.  No Pap smear done today.  No history of significant abnormal Pap smears.  We discussed current screening guidelines and based upon age and hysterectomy history we both agree to stop screening. 6. Colonoscopy 2016.  Follow-up with gastroenterology for their recommendation for repeat interval. 7. Health maintenance.  No routine lab work done as patient does this elsewhere.  Follow-up 1 year, sooner as needed.  An additional 10 minutes plus in addition to her breast and pelvic exam was spent in direct face to face counseling and coordination of care in regards to her abdominal complaints/pelvic relaxation and osteoporosis.    [name] MD, 11:04 AM 01/16/2018

## 2018-02-09 DIAGNOSIS — I10 Essential (primary) hypertension: Secondary | ICD-10-CM | POA: Diagnosis not present

## 2018-02-09 DIAGNOSIS — Z713 Dietary counseling and surveillance: Secondary | ICD-10-CM | POA: Diagnosis not present

## 2018-02-09 DIAGNOSIS — Z6830 Body mass index (BMI) 30.0-30.9, adult: Secondary | ICD-10-CM | POA: Diagnosis not present

## 2018-02-09 DIAGNOSIS — G43909 Migraine, unspecified, not intractable, without status migrainosus: Secondary | ICD-10-CM | POA: Diagnosis not present

## 2018-02-09 DIAGNOSIS — Z299 Encounter for prophylactic measures, unspecified: Secondary | ICD-10-CM | POA: Diagnosis not present

## 2018-03-20 DIAGNOSIS — M25551 Pain in right hip: Secondary | ICD-10-CM | POA: Diagnosis not present

## 2018-03-20 DIAGNOSIS — M47816 Spondylosis without myelopathy or radiculopathy, lumbar region: Secondary | ICD-10-CM | POA: Diagnosis not present

## 2018-03-20 DIAGNOSIS — S79911A Unspecified injury of right hip, initial encounter: Secondary | ICD-10-CM | POA: Diagnosis not present

## 2018-03-20 DIAGNOSIS — I10 Essential (primary) hypertension: Secondary | ICD-10-CM | POA: Diagnosis not present

## 2018-03-20 DIAGNOSIS — Z299 Encounter for prophylactic measures, unspecified: Secondary | ICD-10-CM | POA: Diagnosis not present

## 2018-03-20 DIAGNOSIS — M533 Sacrococcygeal disorders, not elsewhere classified: Secondary | ICD-10-CM | POA: Diagnosis not present

## 2018-03-20 DIAGNOSIS — Z6830 Body mass index (BMI) 30.0-30.9, adult: Secondary | ICD-10-CM | POA: Diagnosis not present

## 2018-03-20 DIAGNOSIS — M545 Low back pain: Secondary | ICD-10-CM | POA: Diagnosis not present

## 2018-03-20 DIAGNOSIS — M419 Scoliosis, unspecified: Secondary | ICD-10-CM | POA: Diagnosis not present

## 2018-03-20 DIAGNOSIS — M47814 Spondylosis without myelopathy or radiculopathy, thoracic region: Secondary | ICD-10-CM | POA: Diagnosis not present

## 2018-03-20 DIAGNOSIS — S3992XA Unspecified injury of lower back, initial encounter: Secondary | ICD-10-CM | POA: Diagnosis not present

## 2018-03-27 DIAGNOSIS — Z6830 Body mass index (BMI) 30.0-30.9, adult: Secondary | ICD-10-CM | POA: Diagnosis not present

## 2018-03-27 DIAGNOSIS — I10 Essential (primary) hypertension: Secondary | ICD-10-CM | POA: Diagnosis not present

## 2018-03-27 DIAGNOSIS — M533 Sacrococcygeal disorders, not elsewhere classified: Secondary | ICD-10-CM | POA: Diagnosis not present

## 2018-03-27 DIAGNOSIS — M25551 Pain in right hip: Secondary | ICD-10-CM | POA: Diagnosis not present

## 2018-03-27 DIAGNOSIS — Z299 Encounter for prophylactic measures, unspecified: Secondary | ICD-10-CM | POA: Diagnosis not present

## 2018-03-27 DIAGNOSIS — G43909 Migraine, unspecified, not intractable, without status migrainosus: Secondary | ICD-10-CM | POA: Diagnosis not present

## 2018-03-27 DIAGNOSIS — D333 Benign neoplasm of cranial nerves: Secondary | ICD-10-CM | POA: Diagnosis not present

## 2018-03-27 DIAGNOSIS — F419 Anxiety disorder, unspecified: Secondary | ICD-10-CM | POA: Diagnosis not present

## 2018-04-16 DIAGNOSIS — R110 Nausea: Secondary | ICD-10-CM | POA: Diagnosis not present

## 2018-04-16 DIAGNOSIS — M545 Low back pain: Secondary | ICD-10-CM | POA: Diagnosis not present

## 2018-04-16 DIAGNOSIS — F419 Anxiety disorder, unspecified: Secondary | ICD-10-CM | POA: Diagnosis not present

## 2018-04-16 DIAGNOSIS — K219 Gastro-esophageal reflux disease without esophagitis: Secondary | ICD-10-CM | POA: Diagnosis not present

## 2018-04-30 DIAGNOSIS — R110 Nausea: Secondary | ICD-10-CM | POA: Diagnosis not present

## 2018-04-30 DIAGNOSIS — Z299 Encounter for prophylactic measures, unspecified: Secondary | ICD-10-CM | POA: Diagnosis not present

## 2018-04-30 DIAGNOSIS — I10 Essential (primary) hypertension: Secondary | ICD-10-CM | POA: Diagnosis not present

## 2018-04-30 DIAGNOSIS — Z6831 Body mass index (BMI) 31.0-31.9, adult: Secondary | ICD-10-CM | POA: Diagnosis not present

## 2018-04-30 DIAGNOSIS — R109 Unspecified abdominal pain: Secondary | ICD-10-CM | POA: Diagnosis not present

## 2018-05-01 ENCOUNTER — Other Ambulatory Visit (HOSPITAL_COMMUNITY): Payer: Self-pay | Admitting: Nurse Practitioner

## 2018-05-01 DIAGNOSIS — R109 Unspecified abdominal pain: Secondary | ICD-10-CM

## 2018-05-01 DIAGNOSIS — R110 Nausea: Secondary | ICD-10-CM

## 2018-05-04 DIAGNOSIS — I10 Essential (primary) hypertension: Secondary | ICD-10-CM | POA: Diagnosis not present

## 2018-05-04 DIAGNOSIS — F419 Anxiety disorder, unspecified: Secondary | ICD-10-CM | POA: Diagnosis not present

## 2018-05-04 DIAGNOSIS — Z299 Encounter for prophylactic measures, unspecified: Secondary | ICD-10-CM | POA: Diagnosis not present

## 2018-05-04 DIAGNOSIS — R58 Hemorrhage, not elsewhere classified: Secondary | ICD-10-CM | POA: Diagnosis not present

## 2018-05-04 DIAGNOSIS — R21 Rash and other nonspecific skin eruption: Secondary | ICD-10-CM | POA: Diagnosis not present

## 2018-05-04 DIAGNOSIS — Z6830 Body mass index (BMI) 30.0-30.9, adult: Secondary | ICD-10-CM | POA: Diagnosis not present

## 2018-05-05 ENCOUNTER — Ambulatory Visit (HOSPITAL_COMMUNITY)
Admission: RE | Admit: 2018-05-05 | Discharge: 2018-05-05 | Disposition: A | Discharge status: Home / Self Care | Payer: Medicare Other | Source: Ambulatory Visit | Attending: Nurse Practitioner | Admitting: Nurse Practitioner

## 2018-05-05 DIAGNOSIS — R160 Hepatomegaly, not elsewhere classified: Secondary | ICD-10-CM | POA: Insufficient documentation

## 2018-05-05 DIAGNOSIS — R110 Nausea: Secondary | ICD-10-CM | POA: Diagnosis not present

## 2018-05-05 DIAGNOSIS — R109 Unspecified abdominal pain: Secondary | ICD-10-CM

## 2018-05-05 DIAGNOSIS — K760 Fatty (change of) liver, not elsewhere classified: Secondary | ICD-10-CM | POA: Diagnosis not present

## 2018-07-01 DIAGNOSIS — F419 Anxiety disorder, unspecified: Secondary | ICD-10-CM | POA: Diagnosis not present

## 2018-07-01 DIAGNOSIS — H1010 Acute atopic conjunctivitis, unspecified eye: Secondary | ICD-10-CM | POA: Diagnosis not present

## 2018-07-01 DIAGNOSIS — I10 Essential (primary) hypertension: Secondary | ICD-10-CM | POA: Diagnosis not present

## 2018-07-01 DIAGNOSIS — Z299 Encounter for prophylactic measures, unspecified: Secondary | ICD-10-CM | POA: Diagnosis not present

## 2018-07-01 DIAGNOSIS — G43909 Migraine, unspecified, not intractable, without status migrainosus: Secondary | ICD-10-CM | POA: Diagnosis not present

## 2018-07-13 DIAGNOSIS — Z789 Other specified health status: Secondary | ICD-10-CM | POA: Diagnosis not present

## 2018-07-13 DIAGNOSIS — Z6830 Body mass index (BMI) 30.0-30.9, adult: Secondary | ICD-10-CM | POA: Diagnosis not present

## 2018-07-13 DIAGNOSIS — I10 Essential (primary) hypertension: Secondary | ICD-10-CM | POA: Diagnosis not present

## 2018-07-13 DIAGNOSIS — G43909 Migraine, unspecified, not intractable, without status migrainosus: Secondary | ICD-10-CM | POA: Diagnosis not present

## 2018-07-13 DIAGNOSIS — Z299 Encounter for prophylactic measures, unspecified: Secondary | ICD-10-CM | POA: Diagnosis not present

## 2018-07-16 DIAGNOSIS — I10 Essential (primary) hypertension: Secondary | ICD-10-CM | POA: Diagnosis not present

## 2018-07-16 DIAGNOSIS — N390 Urinary tract infection, site not specified: Secondary | ICD-10-CM | POA: Diagnosis not present

## 2018-07-16 DIAGNOSIS — Z6830 Body mass index (BMI) 30.0-30.9, adult: Secondary | ICD-10-CM | POA: Diagnosis not present

## 2018-07-16 DIAGNOSIS — R300 Dysuria: Secondary | ICD-10-CM | POA: Diagnosis not present

## 2018-07-16 DIAGNOSIS — Z299 Encounter for prophylactic measures, unspecified: Secondary | ICD-10-CM | POA: Diagnosis not present

## 2018-07-21 DIAGNOSIS — G43909 Migraine, unspecified, not intractable, without status migrainosus: Secondary | ICD-10-CM | POA: Diagnosis not present

## 2018-07-21 DIAGNOSIS — K219 Gastro-esophageal reflux disease without esophagitis: Secondary | ICD-10-CM | POA: Diagnosis not present

## 2018-07-21 DIAGNOSIS — I10 Essential (primary) hypertension: Secondary | ICD-10-CM | POA: Diagnosis not present

## 2018-07-21 DIAGNOSIS — F419 Anxiety disorder, unspecified: Secondary | ICD-10-CM | POA: Diagnosis not present

## 2018-07-21 DIAGNOSIS — Z6829 Body mass index (BMI) 29.0-29.9, adult: Secondary | ICD-10-CM | POA: Diagnosis not present

## 2018-07-21 DIAGNOSIS — Z299 Encounter for prophylactic measures, unspecified: Secondary | ICD-10-CM | POA: Diagnosis not present

## 2018-09-16 DIAGNOSIS — Z299 Encounter for prophylactic measures, unspecified: Secondary | ICD-10-CM | POA: Diagnosis not present

## 2018-09-16 DIAGNOSIS — K210 Gastro-esophageal reflux disease with esophagitis: Secondary | ICD-10-CM | POA: Diagnosis not present

## 2018-09-16 DIAGNOSIS — I10 Essential (primary) hypertension: Secondary | ICD-10-CM | POA: Diagnosis not present

## 2018-09-16 DIAGNOSIS — Z6829 Body mass index (BMI) 29.0-29.9, adult: Secondary | ICD-10-CM | POA: Diagnosis not present

## 2018-09-16 DIAGNOSIS — K589 Irritable bowel syndrome without diarrhea: Secondary | ICD-10-CM | POA: Diagnosis not present

## 2018-09-16 DIAGNOSIS — J069 Acute upper respiratory infection, unspecified: Secondary | ICD-10-CM | POA: Diagnosis not present

## 2018-09-17 DIAGNOSIS — I10 Essential (primary) hypertension: Secondary | ICD-10-CM | POA: Diagnosis not present

## 2018-09-17 DIAGNOSIS — E7800 Pure hypercholesterolemia, unspecified: Secondary | ICD-10-CM | POA: Diagnosis not present

## 2018-09-21 DIAGNOSIS — Z6829 Body mass index (BMI) 29.0-29.9, adult: Secondary | ICD-10-CM | POA: Diagnosis not present

## 2018-09-21 DIAGNOSIS — R079 Chest pain, unspecified: Secondary | ICD-10-CM | POA: Diagnosis not present

## 2018-09-21 DIAGNOSIS — I10 Essential (primary) hypertension: Secondary | ICD-10-CM | POA: Diagnosis not present

## 2018-09-21 DIAGNOSIS — J040 Acute laryngitis: Secondary | ICD-10-CM | POA: Diagnosis not present

## 2018-09-21 DIAGNOSIS — Z299 Encounter for prophylactic measures, unspecified: Secondary | ICD-10-CM | POA: Diagnosis not present

## 2018-09-21 DIAGNOSIS — R51 Headache: Secondary | ICD-10-CM | POA: Diagnosis not present

## 2018-10-14 DIAGNOSIS — E7800 Pure hypercholesterolemia, unspecified: Secondary | ICD-10-CM | POA: Diagnosis not present

## 2018-10-14 DIAGNOSIS — I10 Essential (primary) hypertension: Secondary | ICD-10-CM | POA: Diagnosis not present

## 2018-10-16 DIAGNOSIS — I10 Essential (primary) hypertension: Secondary | ICD-10-CM | POA: Diagnosis not present

## 2018-10-16 DIAGNOSIS — F419 Anxiety disorder, unspecified: Secondary | ICD-10-CM | POA: Diagnosis not present

## 2018-10-16 DIAGNOSIS — G43909 Migraine, unspecified, not intractable, without status migrainosus: Secondary | ICD-10-CM | POA: Diagnosis not present

## 2018-10-16 DIAGNOSIS — Z6828 Body mass index (BMI) 28.0-28.9, adult: Secondary | ICD-10-CM | POA: Diagnosis not present

## 2018-10-16 DIAGNOSIS — R197 Diarrhea, unspecified: Secondary | ICD-10-CM | POA: Diagnosis not present

## 2018-10-16 DIAGNOSIS — Z299 Encounter for prophylactic measures, unspecified: Secondary | ICD-10-CM | POA: Diagnosis not present

## 2018-10-16 DIAGNOSIS — K5900 Constipation, unspecified: Secondary | ICD-10-CM | POA: Diagnosis not present

## 2018-11-25 DIAGNOSIS — I10 Essential (primary) hypertension: Secondary | ICD-10-CM | POA: Diagnosis not present

## 2018-11-25 DIAGNOSIS — Z299 Encounter for prophylactic measures, unspecified: Secondary | ICD-10-CM | POA: Diagnosis not present

## 2018-11-25 DIAGNOSIS — Z6828 Body mass index (BMI) 28.0-28.9, adult: Secondary | ICD-10-CM | POA: Diagnosis not present

## 2018-11-25 DIAGNOSIS — Z7189 Other specified counseling: Secondary | ICD-10-CM | POA: Diagnosis not present

## 2018-11-25 DIAGNOSIS — Z79899 Other long term (current) drug therapy: Secondary | ICD-10-CM | POA: Diagnosis not present

## 2018-11-25 DIAGNOSIS — K219 Gastro-esophageal reflux disease without esophagitis: Secondary | ICD-10-CM | POA: Diagnosis not present

## 2018-11-25 DIAGNOSIS — Z1339 Encounter for screening examination for other mental health and behavioral disorders: Secondary | ICD-10-CM | POA: Diagnosis not present

## 2018-11-25 DIAGNOSIS — Z789 Other specified health status: Secondary | ICD-10-CM | POA: Diagnosis not present

## 2018-11-25 DIAGNOSIS — Z1331 Encounter for screening for depression: Secondary | ICD-10-CM | POA: Diagnosis not present

## 2018-11-25 DIAGNOSIS — R5383 Other fatigue: Secondary | ICD-10-CM | POA: Diagnosis not present

## 2018-11-25 DIAGNOSIS — Z0000 Encounter for general adult medical examination without abnormal findings: Secondary | ICD-10-CM | POA: Diagnosis not present

## 2018-11-25 DIAGNOSIS — Z1211 Encounter for screening for malignant neoplasm of colon: Secondary | ICD-10-CM | POA: Diagnosis not present

## 2018-11-25 DIAGNOSIS — E7800 Pure hypercholesterolemia, unspecified: Secondary | ICD-10-CM | POA: Diagnosis not present

## 2018-11-26 DIAGNOSIS — I10 Essential (primary) hypertension: Secondary | ICD-10-CM | POA: Diagnosis not present

## 2018-11-26 DIAGNOSIS — E7800 Pure hypercholesterolemia, unspecified: Secondary | ICD-10-CM | POA: Diagnosis not present

## 2019-01-22 ENCOUNTER — Encounter: Payer: Self-pay | Admitting: Gynecology

## 2019-01-22 ENCOUNTER — Ambulatory Visit (INDEPENDENT_AMBULATORY_CARE_PROVIDER_SITE_OTHER): Payer: Medicare Other | Admitting: Gynecology

## 2019-01-22 VITALS — BP 120/74 | Ht 66.0 in | Wt 194.0 lb

## 2019-01-22 DIAGNOSIS — N8189 Other female genital prolapse: Secondary | ICD-10-CM | POA: Diagnosis not present

## 2019-01-22 DIAGNOSIS — Z01419 Encounter for gynecological examination (general) (routine) without abnormal findings: Secondary | ICD-10-CM

## 2019-01-22 DIAGNOSIS — M810 Age-related osteoporosis without current pathological fracture: Secondary | ICD-10-CM | POA: Diagnosis not present

## 2019-01-22 DIAGNOSIS — N952 Postmenopausal atrophic vaginitis: Secondary | ICD-10-CM | POA: Diagnosis not present

## 2019-01-22 NOTE — Patient Instructions (Signed)
Call to Schedule your mammogram  Facilities in [place]: 1)  [facility]. [address] [place] Phone: [phone]  2)  Dr. Isaiah Blakes at [facility] [address] Phone: [phone]

## 2019-01-22 NOTE — Progress Notes (Signed)
    Kaitlin May 06-Dec-1945 [id number]        72 y.o.  G2P2 for breast and pelvic exam.  Several issues noted below  Past medical history,surgical history, problem list, medications, allergies, family history and social history were all reviewed and documented as reviewed in the EPIC chart.  ROS:  Performed with pertinent positives and negatives included in the history, assessment and plan.   Additional significant findings : None   Exam: [name] assistant Vitals:   01/22/19 1102  BP: 120/74  Weight: 194 lb (88 kg)  Height: 5\' 6"  (1.676 m)   Body mass index is 31.31 kg/m.  General appearance:  Normal affect, orientation and appearance. Skin: Grossly normal HEENT: Without gross lesions.  No cervical or supraclavicular adenopathy. Thyroid normal.  Lungs:  Clear without wheezing, rales or rhonchi Cardiac: RR, without RMG Abdominal:  Soft, nontender, without masses, guarding, rebound, organomegaly or hernia Breasts:  Examined lying and sitting without masses, retractions, discharge or axillary adenopathy. Pelvic:  Ext, BUS, Vagina: With atrophic changes.  Mild rectocele/high enterocele noted.  Cuff well supported.  No significant cystocele.  Adnexa: Without masses or tenderness    Anus and perineum: Normal   Rectovaginal: Normal sphincter tone without palpated masses or tenderness.    Assessment/Plan:  73 y.o. G2P2 female for breast and pelvic exam  1. Postmenopausal.  No significant menopausal symptoms.  Status post TAH LSO in the past for bleeding and questionable endometrial hyperplasia. 2. Pelvic relaxation.  Mild rectocele/high enterocele.  Stable on serial exams. 3. Constipation/diarrhea.  Patient having issues with her stooling which is causing her rectal pain.  Recommended she follow-up with her gastroenterologist at [facility].  Colonoscopy 2016. 4. Osteoporosis.  DEXA 2018 T score -2.8.  We discussed recommendations for treatment on several occasions with my  strong recommendation to start medication.  We again reviewed this today.  She is not willing to start any medication at this time.  She understands the risks of fracture.  Recommended she consider another DEXA now is baseline and patient declines.  She clearly understands the issues and risks but at this point is not ready to move forward with medication or further studies. 5. Pap smear 2016.  No Pap smear done today.  No history of significant abnormal Pap smears.  Per current screening guidelines we both agree to stop screening. 6. Mammography 2017.  I again strongly recommended patient schedule a screening mammogram.  Most common cancer in women reviewed.  Benefits of early detection discussed.  Names and numbers provided for her to call.  Breast exam normal today. 7. Health maintenance.  No routine lab work done as patient does this elsewhere.  Follow-up 1 year, sooner as needed.   [name] MD, 11:47 AM 01/22/2019

## 2019-01-25 DIAGNOSIS — Z299 Encounter for prophylactic measures, unspecified: Secondary | ICD-10-CM | POA: Diagnosis not present

## 2019-01-25 DIAGNOSIS — I10 Essential (primary) hypertension: Secondary | ICD-10-CM | POA: Diagnosis not present

## 2019-01-25 DIAGNOSIS — G43909 Migraine, unspecified, not intractable, without status migrainosus: Secondary | ICD-10-CM | POA: Diagnosis not present

## 2019-01-25 DIAGNOSIS — F419 Anxiety disorder, unspecified: Secondary | ICD-10-CM | POA: Diagnosis not present

## 2019-01-25 DIAGNOSIS — Z6829 Body mass index (BMI) 29.0-29.9, adult: Secondary | ICD-10-CM | POA: Diagnosis not present

## 2019-01-25 DIAGNOSIS — K2900 Acute gastritis without bleeding: Secondary | ICD-10-CM | POA: Diagnosis not present

## 2019-04-26 DIAGNOSIS — F419 Anxiety disorder, unspecified: Secondary | ICD-10-CM | POA: Diagnosis not present

## 2019-04-26 DIAGNOSIS — Z299 Encounter for prophylactic measures, unspecified: Secondary | ICD-10-CM | POA: Diagnosis not present

## 2019-04-26 DIAGNOSIS — G43909 Migraine, unspecified, not intractable, without status migrainosus: Secondary | ICD-10-CM | POA: Diagnosis not present

## 2019-04-26 DIAGNOSIS — R109 Unspecified abdominal pain: Secondary | ICD-10-CM | POA: Diagnosis not present

## 2019-04-26 DIAGNOSIS — I10 Essential (primary) hypertension: Secondary | ICD-10-CM | POA: Diagnosis not present

## 2019-04-26 DIAGNOSIS — Z6831 Body mass index (BMI) 31.0-31.9, adult: Secondary | ICD-10-CM | POA: Diagnosis not present

## 2019-04-28 DIAGNOSIS — R110 Nausea: Secondary | ICD-10-CM | POA: Diagnosis not present

## 2019-04-28 DIAGNOSIS — Z6831 Body mass index (BMI) 31.0-31.9, adult: Secondary | ICD-10-CM | POA: Diagnosis not present

## 2019-04-28 DIAGNOSIS — M25532 Pain in left wrist: Secondary | ICD-10-CM | POA: Diagnosis not present

## 2019-04-28 DIAGNOSIS — Z299 Encounter for prophylactic measures, unspecified: Secondary | ICD-10-CM | POA: Diagnosis not present

## 2019-04-28 DIAGNOSIS — E7800 Pure hypercholesterolemia, unspecified: Secondary | ICD-10-CM | POA: Diagnosis not present

## 2019-04-28 DIAGNOSIS — S6992XA Unspecified injury of left wrist, hand and finger(s), initial encounter: Secondary | ICD-10-CM | POA: Diagnosis not present

## 2019-04-28 DIAGNOSIS — M79642 Pain in left hand: Secondary | ICD-10-CM | POA: Diagnosis not present

## 2019-05-20 DIAGNOSIS — B3781 Candidal esophagitis: Secondary | ICD-10-CM | POA: Diagnosis not present

## 2019-05-20 DIAGNOSIS — F419 Anxiety disorder, unspecified: Secondary | ICD-10-CM | POA: Diagnosis not present

## 2019-05-20 DIAGNOSIS — G43909 Migraine, unspecified, not intractable, without status migrainosus: Secondary | ICD-10-CM | POA: Diagnosis not present

## 2019-05-20 DIAGNOSIS — I10 Essential (primary) hypertension: Secondary | ICD-10-CM | POA: Diagnosis not present

## 2019-05-20 DIAGNOSIS — Z6831 Body mass index (BMI) 31.0-31.9, adult: Secondary | ICD-10-CM | POA: Diagnosis not present

## 2019-05-20 DIAGNOSIS — D333 Benign neoplasm of cranial nerves: Secondary | ICD-10-CM | POA: Diagnosis not present

## 2019-05-20 DIAGNOSIS — Z299 Encounter for prophylactic measures, unspecified: Secondary | ICD-10-CM | POA: Diagnosis not present

## 2019-07-27 DIAGNOSIS — D333 Benign neoplasm of cranial nerves: Secondary | ICD-10-CM | POA: Diagnosis not present

## 2019-07-27 DIAGNOSIS — Z299 Encounter for prophylactic measures, unspecified: Secondary | ICD-10-CM | POA: Diagnosis not present

## 2019-07-27 DIAGNOSIS — I10 Essential (primary) hypertension: Secondary | ICD-10-CM | POA: Diagnosis not present

## 2019-07-27 DIAGNOSIS — F419 Anxiety disorder, unspecified: Secondary | ICD-10-CM | POA: Diagnosis not present

## 2019-07-27 DIAGNOSIS — G43909 Migraine, unspecified, not intractable, without status migrainosus: Secondary | ICD-10-CM | POA: Diagnosis not present

## 2019-08-17 DIAGNOSIS — I10 Essential (primary) hypertension: Secondary | ICD-10-CM | POA: Diagnosis not present

## 2019-08-19 DIAGNOSIS — Z713 Dietary counseling and surveillance: Secondary | ICD-10-CM | POA: Diagnosis not present

## 2019-08-19 DIAGNOSIS — I10 Essential (primary) hypertension: Secondary | ICD-10-CM | POA: Diagnosis not present

## 2019-08-19 DIAGNOSIS — G43909 Migraine, unspecified, not intractable, without status migrainosus: Secondary | ICD-10-CM | POA: Diagnosis not present

## 2019-08-19 DIAGNOSIS — Z299 Encounter for prophylactic measures, unspecified: Secondary | ICD-10-CM | POA: Diagnosis not present

## 2019-08-19 DIAGNOSIS — Z6830 Body mass index (BMI) 30.0-30.9, adult: Secondary | ICD-10-CM | POA: Diagnosis not present

## 2019-08-26 ENCOUNTER — Encounter: Payer: Self-pay | Admitting: Gynecology

## 2019-08-27 DIAGNOSIS — I10 Essential (primary) hypertension: Secondary | ICD-10-CM | POA: Diagnosis not present

## 2019-08-27 DIAGNOSIS — Z713 Dietary counseling and surveillance: Secondary | ICD-10-CM | POA: Diagnosis not present

## 2019-08-27 DIAGNOSIS — Z299 Encounter for prophylactic measures, unspecified: Secondary | ICD-10-CM | POA: Diagnosis not present

## 2019-08-27 DIAGNOSIS — Z6830 Body mass index (BMI) 30.0-30.9, adult: Secondary | ICD-10-CM | POA: Diagnosis not present

## 2019-09-23 DIAGNOSIS — H109 Unspecified conjunctivitis: Secondary | ICD-10-CM | POA: Diagnosis not present

## 2019-09-23 DIAGNOSIS — R519 Headache, unspecified: Secondary | ICD-10-CM | POA: Diagnosis not present

## 2019-09-23 DIAGNOSIS — Z299 Encounter for prophylactic measures, unspecified: Secondary | ICD-10-CM | POA: Diagnosis not present

## 2019-09-23 DIAGNOSIS — Z6830 Body mass index (BMI) 30.0-30.9, adult: Secondary | ICD-10-CM | POA: Diagnosis not present

## 2019-09-23 DIAGNOSIS — H1010 Acute atopic conjunctivitis, unspecified eye: Secondary | ICD-10-CM | POA: Diagnosis not present

## 2019-10-07 DIAGNOSIS — I10 Essential (primary) hypertension: Secondary | ICD-10-CM | POA: Diagnosis not present

## 2019-10-07 DIAGNOSIS — R519 Headache, unspecified: Secondary | ICD-10-CM | POA: Diagnosis not present

## 2019-10-07 DIAGNOSIS — J019 Acute sinusitis, unspecified: Secondary | ICD-10-CM | POA: Diagnosis not present

## 2019-10-07 DIAGNOSIS — Z6829 Body mass index (BMI) 29.0-29.9, adult: Secondary | ICD-10-CM | POA: Diagnosis not present

## 2019-10-07 DIAGNOSIS — Z299 Encounter for prophylactic measures, unspecified: Secondary | ICD-10-CM | POA: Diagnosis not present

## 2019-10-20 DIAGNOSIS — R110 Nausea: Secondary | ICD-10-CM | POA: Diagnosis not present

## 2019-10-20 DIAGNOSIS — Z6829 Body mass index (BMI) 29.0-29.9, adult: Secondary | ICD-10-CM | POA: Diagnosis not present

## 2019-10-20 DIAGNOSIS — G43909 Migraine, unspecified, not intractable, without status migrainosus: Secondary | ICD-10-CM | POA: Diagnosis not present

## 2019-10-20 DIAGNOSIS — F419 Anxiety disorder, unspecified: Secondary | ICD-10-CM | POA: Diagnosis not present

## 2019-10-20 DIAGNOSIS — Z299 Encounter for prophylactic measures, unspecified: Secondary | ICD-10-CM | POA: Diagnosis not present

## 2019-10-20 DIAGNOSIS — K5900 Constipation, unspecified: Secondary | ICD-10-CM | POA: Diagnosis not present

## 2019-10-20 DIAGNOSIS — I10 Essential (primary) hypertension: Secondary | ICD-10-CM | POA: Diagnosis not present

## 2019-12-02 DIAGNOSIS — Z0000 Encounter for general adult medical examination without abnormal findings: Secondary | ICD-10-CM | POA: Diagnosis not present

## 2019-12-02 DIAGNOSIS — I10 Essential (primary) hypertension: Secondary | ICD-10-CM | POA: Diagnosis not present

## 2019-12-02 DIAGNOSIS — Z7189 Other specified counseling: Secondary | ICD-10-CM | POA: Diagnosis not present

## 2019-12-02 DIAGNOSIS — E7800 Pure hypercholesterolemia, unspecified: Secondary | ICD-10-CM | POA: Diagnosis not present

## 2019-12-02 DIAGNOSIS — Z1331 Encounter for screening for depression: Secondary | ICD-10-CM | POA: Diagnosis not present

## 2019-12-02 DIAGNOSIS — Z1339 Encounter for screening examination for other mental health and behavioral disorders: Secondary | ICD-10-CM | POA: Diagnosis not present

## 2019-12-02 DIAGNOSIS — R5383 Other fatigue: Secondary | ICD-10-CM | POA: Diagnosis not present

## 2019-12-02 DIAGNOSIS — Z299 Encounter for prophylactic measures, unspecified: Secondary | ICD-10-CM | POA: Diagnosis not present

## 2019-12-02 DIAGNOSIS — Z6829 Body mass index (BMI) 29.0-29.9, adult: Secondary | ICD-10-CM | POA: Diagnosis not present

## 2019-12-02 DIAGNOSIS — Z1211 Encounter for screening for malignant neoplasm of colon: Secondary | ICD-10-CM | POA: Diagnosis not present

## 2020-01-17 DIAGNOSIS — D333 Benign neoplasm of cranial nerves: Secondary | ICD-10-CM | POA: Diagnosis not present

## 2020-01-17 DIAGNOSIS — I10 Essential (primary) hypertension: Secondary | ICD-10-CM | POA: Diagnosis not present

## 2020-01-17 DIAGNOSIS — F419 Anxiety disorder, unspecified: Secondary | ICD-10-CM | POA: Diagnosis not present

## 2020-01-17 DIAGNOSIS — K219 Gastro-esophageal reflux disease without esophagitis: Secondary | ICD-10-CM | POA: Diagnosis not present

## 2020-01-17 DIAGNOSIS — Z6830 Body mass index (BMI) 30.0-30.9, adult: Secondary | ICD-10-CM | POA: Diagnosis not present

## 2020-01-17 DIAGNOSIS — G43909 Migraine, unspecified, not intractable, without status migrainosus: Secondary | ICD-10-CM | POA: Diagnosis not present

## 2020-01-17 DIAGNOSIS — Z299 Encounter for prophylactic measures, unspecified: Secondary | ICD-10-CM | POA: Diagnosis not present

## 2020-01-25 ENCOUNTER — Encounter: Payer: Medicare Other | Admitting: Obstetrics and Gynecology

## 2020-04-19 DIAGNOSIS — K589 Irritable bowel syndrome without diarrhea: Secondary | ICD-10-CM | POA: Diagnosis not present

## 2020-04-19 DIAGNOSIS — Z299 Encounter for prophylactic measures, unspecified: Secondary | ICD-10-CM | POA: Diagnosis not present

## 2020-04-19 DIAGNOSIS — I10 Essential (primary) hypertension: Secondary | ICD-10-CM | POA: Diagnosis not present

## 2020-04-19 DIAGNOSIS — K2100 Gastro-esophageal reflux disease with esophagitis, without bleeding: Secondary | ICD-10-CM | POA: Diagnosis not present

## 2020-04-19 DIAGNOSIS — Z6830 Body mass index (BMI) 30.0-30.9, adult: Secondary | ICD-10-CM | POA: Diagnosis not present

## 2020-04-19 DIAGNOSIS — G43909 Migraine, unspecified, not intractable, without status migrainosus: Secondary | ICD-10-CM | POA: Diagnosis not present

## 2020-04-19 DIAGNOSIS — F419 Anxiety disorder, unspecified: Secondary | ICD-10-CM | POA: Diagnosis not present

## 2020-07-20 DIAGNOSIS — Z6829 Body mass index (BMI) 29.0-29.9, adult: Secondary | ICD-10-CM | POA: Diagnosis not present

## 2020-07-20 DIAGNOSIS — G43909 Migraine, unspecified, not intractable, without status migrainosus: Secondary | ICD-10-CM | POA: Diagnosis not present

## 2020-07-20 DIAGNOSIS — F419 Anxiety disorder, unspecified: Secondary | ICD-10-CM | POA: Diagnosis not present

## 2020-07-20 DIAGNOSIS — I10 Essential (primary) hypertension: Secondary | ICD-10-CM | POA: Diagnosis not present

## 2020-07-20 DIAGNOSIS — R110 Nausea: Secondary | ICD-10-CM | POA: Diagnosis not present

## 2020-07-20 DIAGNOSIS — Z299 Encounter for prophylactic measures, unspecified: Secondary | ICD-10-CM | POA: Diagnosis not present

## 2020-09-26 DIAGNOSIS — F32A Depression, unspecified: Secondary | ICD-10-CM | POA: Diagnosis not present

## 2020-09-26 DIAGNOSIS — R519 Headache, unspecified: Secondary | ICD-10-CM | POA: Diagnosis not present

## 2020-09-26 DIAGNOSIS — Z299 Encounter for prophylactic measures, unspecified: Secondary | ICD-10-CM | POA: Diagnosis not present

## 2020-09-26 DIAGNOSIS — I10 Essential (primary) hypertension: Secondary | ICD-10-CM | POA: Diagnosis not present

## 2020-09-26 DIAGNOSIS — J329 Chronic sinusitis, unspecified: Secondary | ICD-10-CM | POA: Diagnosis not present

## 2020-10-19 DIAGNOSIS — K219 Gastro-esophageal reflux disease without esophagitis: Secondary | ICD-10-CM | POA: Diagnosis not present

## 2020-10-19 DIAGNOSIS — Z299 Encounter for prophylactic measures, unspecified: Secondary | ICD-10-CM | POA: Diagnosis not present

## 2020-10-19 DIAGNOSIS — I10 Essential (primary) hypertension: Secondary | ICD-10-CM | POA: Diagnosis not present

## 2020-10-19 DIAGNOSIS — R519 Headache, unspecified: Secondary | ICD-10-CM | POA: Diagnosis not present

## 2020-10-19 DIAGNOSIS — R110 Nausea: Secondary | ICD-10-CM | POA: Diagnosis not present

## 2020-11-15 DIAGNOSIS — R519 Headache, unspecified: Secondary | ICD-10-CM | POA: Diagnosis not present

## 2020-11-15 DIAGNOSIS — J309 Allergic rhinitis, unspecified: Secondary | ICD-10-CM | POA: Diagnosis not present

## 2020-11-15 DIAGNOSIS — Z299 Encounter for prophylactic measures, unspecified: Secondary | ICD-10-CM | POA: Diagnosis not present

## 2020-11-15 DIAGNOSIS — I10 Essential (primary) hypertension: Secondary | ICD-10-CM | POA: Diagnosis not present

## 2020-11-22 DIAGNOSIS — Z6829 Body mass index (BMI) 29.0-29.9, adult: Secondary | ICD-10-CM | POA: Diagnosis not present

## 2020-11-22 DIAGNOSIS — Z299 Encounter for prophylactic measures, unspecified: Secondary | ICD-10-CM | POA: Diagnosis not present

## 2020-11-22 DIAGNOSIS — J040 Acute laryngitis: Secondary | ICD-10-CM | POA: Diagnosis not present

## 2020-11-22 DIAGNOSIS — Z789 Other specified health status: Secondary | ICD-10-CM | POA: Diagnosis not present

## 2020-11-22 DIAGNOSIS — F419 Anxiety disorder, unspecified: Secondary | ICD-10-CM | POA: Diagnosis not present

## 2020-11-22 DIAGNOSIS — I10 Essential (primary) hypertension: Secondary | ICD-10-CM | POA: Diagnosis not present

## 2020-11-22 DIAGNOSIS — D333 Benign neoplasm of cranial nerves: Secondary | ICD-10-CM | POA: Diagnosis not present

## 2020-11-24 DIAGNOSIS — U071 COVID-19: Secondary | ICD-10-CM | POA: Diagnosis not present

## 2020-11-30 DIAGNOSIS — F32A Depression, unspecified: Secondary | ICD-10-CM | POA: Diagnosis not present

## 2020-11-30 DIAGNOSIS — I10 Essential (primary) hypertension: Secondary | ICD-10-CM | POA: Diagnosis not present

## 2020-11-30 DIAGNOSIS — F419 Anxiety disorder, unspecified: Secondary | ICD-10-CM | POA: Diagnosis not present

## 2020-11-30 DIAGNOSIS — Z299 Encounter for prophylactic measures, unspecified: Secondary | ICD-10-CM | POA: Diagnosis not present

## 2020-11-30 DIAGNOSIS — U071 COVID-19: Secondary | ICD-10-CM | POA: Diagnosis not present

## 2020-12-21 DIAGNOSIS — Z789 Other specified health status: Secondary | ICD-10-CM | POA: Diagnosis not present

## 2020-12-21 DIAGNOSIS — Z0000 Encounter for general adult medical examination without abnormal findings: Secondary | ICD-10-CM | POA: Diagnosis not present

## 2020-12-21 DIAGNOSIS — E7800 Pure hypercholesterolemia, unspecified: Secondary | ICD-10-CM | POA: Diagnosis not present

## 2020-12-21 DIAGNOSIS — Z7189 Other specified counseling: Secondary | ICD-10-CM | POA: Diagnosis not present

## 2020-12-21 DIAGNOSIS — Z79899 Other long term (current) drug therapy: Secondary | ICD-10-CM | POA: Diagnosis not present

## 2020-12-21 DIAGNOSIS — Z1331 Encounter for screening for depression: Secondary | ICD-10-CM | POA: Diagnosis not present

## 2020-12-21 DIAGNOSIS — R5383 Other fatigue: Secondary | ICD-10-CM | POA: Diagnosis not present

## 2020-12-21 DIAGNOSIS — I10 Essential (primary) hypertension: Secondary | ICD-10-CM | POA: Diagnosis not present

## 2020-12-21 DIAGNOSIS — Z299 Encounter for prophylactic measures, unspecified: Secondary | ICD-10-CM | POA: Diagnosis not present

## 2020-12-21 DIAGNOSIS — Z1339 Encounter for screening examination for other mental health and behavioral disorders: Secondary | ICD-10-CM | POA: Diagnosis not present

## 2020-12-21 DIAGNOSIS — Z6827 Body mass index (BMI) 27.0-27.9, adult: Secondary | ICD-10-CM | POA: Diagnosis not present

## 2020-12-21 DIAGNOSIS — F419 Anxiety disorder, unspecified: Secondary | ICD-10-CM | POA: Diagnosis not present

## 2021-01-17 DIAGNOSIS — K589 Irritable bowel syndrome without diarrhea: Secondary | ICD-10-CM | POA: Diagnosis not present

## 2021-01-17 DIAGNOSIS — Z299 Encounter for prophylactic measures, unspecified: Secondary | ICD-10-CM | POA: Diagnosis not present

## 2021-01-17 DIAGNOSIS — R110 Nausea: Secondary | ICD-10-CM | POA: Diagnosis not present

## 2021-01-17 DIAGNOSIS — G43909 Migraine, unspecified, not intractable, without status migrainosus: Secondary | ICD-10-CM | POA: Diagnosis not present

## 2021-01-17 DIAGNOSIS — I10 Essential (primary) hypertension: Secondary | ICD-10-CM | POA: Diagnosis not present

## 2021-01-17 DIAGNOSIS — F419 Anxiety disorder, unspecified: Secondary | ICD-10-CM | POA: Diagnosis not present

## 2021-04-19 DIAGNOSIS — N1831 Chronic kidney disease, stage 3a: Secondary | ICD-10-CM | POA: Diagnosis not present

## 2021-04-19 DIAGNOSIS — G43909 Migraine, unspecified, not intractable, without status migrainosus: Secondary | ICD-10-CM | POA: Diagnosis not present

## 2021-04-19 DIAGNOSIS — I10 Essential (primary) hypertension: Secondary | ICD-10-CM | POA: Diagnosis not present

## 2021-04-19 DIAGNOSIS — F419 Anxiety disorder, unspecified: Secondary | ICD-10-CM | POA: Diagnosis not present

## 2021-04-19 DIAGNOSIS — Z299 Encounter for prophylactic measures, unspecified: Secondary | ICD-10-CM | POA: Diagnosis not present

## 2021-07-20 DIAGNOSIS — Z299 Encounter for prophylactic measures, unspecified: Secondary | ICD-10-CM | POA: Diagnosis not present

## 2021-07-20 DIAGNOSIS — M2550 Pain in unspecified joint: Secondary | ICD-10-CM | POA: Diagnosis not present

## 2021-07-20 DIAGNOSIS — I10 Essential (primary) hypertension: Secondary | ICD-10-CM | POA: Diagnosis not present

## 2021-07-20 DIAGNOSIS — M545 Low back pain, unspecified: Secondary | ICD-10-CM | POA: Diagnosis not present

## 2021-07-20 DIAGNOSIS — G43909 Migraine, unspecified, not intractable, without status migrainosus: Secondary | ICD-10-CM | POA: Diagnosis not present

## 2021-08-27 DIAGNOSIS — H1033 Unspecified acute conjunctivitis, bilateral: Secondary | ICD-10-CM | POA: Diagnosis not present

## 2021-08-27 DIAGNOSIS — H2513 Age-related nuclear cataract, bilateral: Secondary | ICD-10-CM | POA: Diagnosis not present

## 2021-09-27 DIAGNOSIS — H02204 Unspecified lagophthalmos left upper eyelid: Secondary | ICD-10-CM | POA: Diagnosis not present

## 2021-09-27 DIAGNOSIS — H5203 Hypermetropia, bilateral: Secondary | ICD-10-CM | POA: Diagnosis not present

## 2021-09-27 DIAGNOSIS — H524 Presbyopia: Secondary | ICD-10-CM | POA: Diagnosis not present

## 2021-09-27 DIAGNOSIS — H2513 Age-related nuclear cataract, bilateral: Secondary | ICD-10-CM | POA: Diagnosis not present

## 2021-09-27 DIAGNOSIS — H02201 Unspecified lagophthalmos right upper eyelid: Secondary | ICD-10-CM | POA: Diagnosis not present

## 2021-10-19 DIAGNOSIS — M545 Low back pain, unspecified: Secondary | ICD-10-CM | POA: Diagnosis not present

## 2021-10-19 DIAGNOSIS — I10 Essential (primary) hypertension: Secondary | ICD-10-CM | POA: Diagnosis not present

## 2021-10-19 DIAGNOSIS — Z299 Encounter for prophylactic measures, unspecified: Secondary | ICD-10-CM | POA: Diagnosis not present

## 2021-10-19 DIAGNOSIS — R110 Nausea: Secondary | ICD-10-CM | POA: Diagnosis not present

## 2021-10-19 DIAGNOSIS — G43909 Migraine, unspecified, not intractable, without status migrainosus: Secondary | ICD-10-CM | POA: Diagnosis not present

## 2021-11-22 DIAGNOSIS — Z20828 Contact with and (suspected) exposure to other viral communicable diseases: Secondary | ICD-10-CM | POA: Diagnosis not present

## 2021-12-25 DIAGNOSIS — Z6829 Body mass index (BMI) 29.0-29.9, adult: Secondary | ICD-10-CM | POA: Diagnosis not present

## 2021-12-25 DIAGNOSIS — Z299 Encounter for prophylactic measures, unspecified: Secondary | ICD-10-CM | POA: Diagnosis not present

## 2021-12-25 DIAGNOSIS — Z1331 Encounter for screening for depression: Secondary | ICD-10-CM | POA: Diagnosis not present

## 2021-12-25 DIAGNOSIS — Z1339 Encounter for screening examination for other mental health and behavioral disorders: Secondary | ICD-10-CM | POA: Diagnosis not present

## 2021-12-25 DIAGNOSIS — M545 Low back pain, unspecified: Secondary | ICD-10-CM | POA: Diagnosis not present

## 2021-12-25 DIAGNOSIS — I10 Essential (primary) hypertension: Secondary | ICD-10-CM | POA: Diagnosis not present

## 2021-12-25 DIAGNOSIS — Z0000 Encounter for general adult medical examination without abnormal findings: Secondary | ICD-10-CM | POA: Diagnosis not present

## 2021-12-25 DIAGNOSIS — M7989 Other specified soft tissue disorders: Secondary | ICD-10-CM | POA: Diagnosis not present

## 2021-12-25 DIAGNOSIS — Z7189 Other specified counseling: Secondary | ICD-10-CM | POA: Diagnosis not present

## 2021-12-25 DIAGNOSIS — E7800 Pure hypercholesterolemia, unspecified: Secondary | ICD-10-CM | POA: Diagnosis not present

## 2021-12-25 DIAGNOSIS — Z79899 Other long term (current) drug therapy: Secondary | ICD-10-CM | POA: Diagnosis not present

## 2021-12-25 DIAGNOSIS — R5383 Other fatigue: Secondary | ICD-10-CM | POA: Diagnosis not present

## 2021-12-31 DIAGNOSIS — R609 Edema, unspecified: Secondary | ICD-10-CM | POA: Diagnosis not present

## 2021-12-31 DIAGNOSIS — R600 Localized edema: Secondary | ICD-10-CM | POA: Diagnosis not present

## 2022-01-17 DIAGNOSIS — I10 Essential (primary) hypertension: Secondary | ICD-10-CM | POA: Diagnosis not present

## 2022-01-17 DIAGNOSIS — R519 Headache, unspecified: Secondary | ICD-10-CM | POA: Diagnosis not present

## 2022-01-17 DIAGNOSIS — K219 Gastro-esophageal reflux disease without esophagitis: Secondary | ICD-10-CM | POA: Diagnosis not present

## 2022-01-17 DIAGNOSIS — Z299 Encounter for prophylactic measures, unspecified: Secondary | ICD-10-CM | POA: Diagnosis not present

## 2022-01-17 DIAGNOSIS — M25472 Effusion, left ankle: Secondary | ICD-10-CM | POA: Diagnosis not present

## 2022-01-23 DIAGNOSIS — J309 Allergic rhinitis, unspecified: Secondary | ICD-10-CM | POA: Diagnosis not present

## 2022-01-23 DIAGNOSIS — I10 Essential (primary) hypertension: Secondary | ICD-10-CM | POA: Diagnosis not present

## 2022-01-23 DIAGNOSIS — Z299 Encounter for prophylactic measures, unspecified: Secondary | ICD-10-CM | POA: Diagnosis not present

## 2022-01-23 DIAGNOSIS — H109 Unspecified conjunctivitis: Secondary | ICD-10-CM | POA: Diagnosis not present

## 2022-05-01 DIAGNOSIS — M545 Low back pain, unspecified: Secondary | ICD-10-CM | POA: Diagnosis not present

## 2022-05-01 DIAGNOSIS — I10 Essential (primary) hypertension: Secondary | ICD-10-CM | POA: Diagnosis not present

## 2022-05-01 DIAGNOSIS — Z299 Encounter for prophylactic measures, unspecified: Secondary | ICD-10-CM | POA: Diagnosis not present

## 2022-05-01 DIAGNOSIS — G43909 Migraine, unspecified, not intractable, without status migrainosus: Secondary | ICD-10-CM | POA: Diagnosis not present

## 2022-05-01 DIAGNOSIS — F419 Anxiety disorder, unspecified: Secondary | ICD-10-CM | POA: Diagnosis not present

## 2022-06-25 DIAGNOSIS — L98499 Non-pressure chronic ulcer of skin of other sites with unspecified severity: Secondary | ICD-10-CM | POA: Diagnosis not present

## 2022-06-25 DIAGNOSIS — Z299 Encounter for prophylactic measures, unspecified: Secondary | ICD-10-CM | POA: Diagnosis not present

## 2022-06-25 DIAGNOSIS — D333 Benign neoplasm of cranial nerves: Secondary | ICD-10-CM | POA: Diagnosis not present

## 2022-07-11 DIAGNOSIS — I10 Essential (primary) hypertension: Secondary | ICD-10-CM | POA: Diagnosis not present

## 2022-07-11 DIAGNOSIS — Z299 Encounter for prophylactic measures, unspecified: Secondary | ICD-10-CM | POA: Diagnosis not present

## 2022-07-11 DIAGNOSIS — R609 Edema, unspecified: Secondary | ICD-10-CM | POA: Diagnosis not present

## 2022-07-11 DIAGNOSIS — N611 Abscess of the breast and nipple: Secondary | ICD-10-CM | POA: Diagnosis not present

## 2022-08-29 DIAGNOSIS — M545 Low back pain, unspecified: Secondary | ICD-10-CM | POA: Diagnosis not present

## 2022-08-29 DIAGNOSIS — R110 Nausea: Secondary | ICD-10-CM | POA: Diagnosis not present

## 2022-08-29 DIAGNOSIS — Z299 Encounter for prophylactic measures, unspecified: Secondary | ICD-10-CM | POA: Diagnosis not present

## 2022-08-29 DIAGNOSIS — I10 Essential (primary) hypertension: Secondary | ICD-10-CM | POA: Diagnosis not present

## 2022-08-29 DIAGNOSIS — F419 Anxiety disorder, unspecified: Secondary | ICD-10-CM | POA: Diagnosis not present

## 2022-09-11 DIAGNOSIS — R519 Headache, unspecified: Secondary | ICD-10-CM | POA: Diagnosis not present

## 2022-09-11 DIAGNOSIS — I10 Essential (primary) hypertension: Secondary | ICD-10-CM | POA: Diagnosis not present

## 2022-09-11 DIAGNOSIS — Z299 Encounter for prophylactic measures, unspecified: Secondary | ICD-10-CM | POA: Diagnosis not present

## 2022-10-22 DIAGNOSIS — N816 Rectocele: Secondary | ICD-10-CM | POA: Diagnosis not present

## 2022-10-22 DIAGNOSIS — Z299 Encounter for prophylactic measures, unspecified: Secondary | ICD-10-CM | POA: Diagnosis not present

## 2022-10-22 DIAGNOSIS — M545 Low back pain, unspecified: Secondary | ICD-10-CM | POA: Diagnosis not present

## 2022-10-22 DIAGNOSIS — N8110 Cystocele, unspecified: Secondary | ICD-10-CM | POA: Diagnosis not present

## 2022-10-22 DIAGNOSIS — I10 Essential (primary) hypertension: Secondary | ICD-10-CM | POA: Diagnosis not present

## 2022-10-25 ENCOUNTER — Encounter: Payer: Medicare Other | Admitting: Obstetrics & Gynecology

## 2022-11-07 DIAGNOSIS — I10 Essential (primary) hypertension: Secondary | ICD-10-CM | POA: Diagnosis not present

## 2022-11-07 DIAGNOSIS — N390 Urinary tract infection, site not specified: Secondary | ICD-10-CM | POA: Diagnosis not present

## 2022-11-07 DIAGNOSIS — Z299 Encounter for prophylactic measures, unspecified: Secondary | ICD-10-CM | POA: Diagnosis not present

## 2022-11-19 DIAGNOSIS — K5909 Other constipation: Secondary | ICD-10-CM | POA: Diagnosis not present

## 2022-11-19 DIAGNOSIS — N8110 Cystocele, unspecified: Secondary | ICD-10-CM | POA: Diagnosis not present

## 2022-11-19 DIAGNOSIS — N816 Rectocele: Secondary | ICD-10-CM | POA: Diagnosis not present

## 2022-11-21 ENCOUNTER — Encounter: Payer: Medicare Other | Admitting: Obstetrics & Gynecology

## 2022-11-28 DIAGNOSIS — Z7409 Other reduced mobility: Secondary | ICD-10-CM | POA: Diagnosis not present

## 2022-11-28 DIAGNOSIS — N816 Rectocele: Secondary | ICD-10-CM | POA: Diagnosis not present

## 2022-11-28 DIAGNOSIS — N8110 Cystocele, unspecified: Secondary | ICD-10-CM | POA: Diagnosis not present

## 2022-11-28 DIAGNOSIS — R278 Other lack of coordination: Secondary | ICD-10-CM | POA: Diagnosis not present

## 2022-11-28 DIAGNOSIS — K5909 Other constipation: Secondary | ICD-10-CM | POA: Diagnosis not present

## 2022-12-04 DIAGNOSIS — R278 Other lack of coordination: Secondary | ICD-10-CM | POA: Diagnosis not present

## 2022-12-04 DIAGNOSIS — N8110 Cystocele, unspecified: Secondary | ICD-10-CM | POA: Diagnosis not present

## 2022-12-04 DIAGNOSIS — N816 Rectocele: Secondary | ICD-10-CM | POA: Diagnosis not present

## 2022-12-04 DIAGNOSIS — K5909 Other constipation: Secondary | ICD-10-CM | POA: Diagnosis not present

## 2022-12-04 DIAGNOSIS — Z7409 Other reduced mobility: Secondary | ICD-10-CM | POA: Diagnosis not present

## 2022-12-06 ENCOUNTER — Ambulatory Visit (INDEPENDENT_AMBULATORY_CARE_PROVIDER_SITE_OTHER): Payer: Medicare Other | Admitting: Obstetrics and Gynecology

## 2022-12-06 ENCOUNTER — Encounter: Payer: Self-pay | Admitting: Obstetrics and Gynecology

## 2022-12-06 VITALS — BP 155/82 | HR 95 | Wt 188.0 lb

## 2022-12-06 DIAGNOSIS — R159 Full incontinence of feces: Secondary | ICD-10-CM | POA: Diagnosis not present

## 2022-12-06 DIAGNOSIS — N816 Rectocele: Secondary | ICD-10-CM

## 2022-12-06 DIAGNOSIS — N993 Prolapse of vaginal vault after hysterectomy: Secondary | ICD-10-CM | POA: Diagnosis not present

## 2022-12-06 DIAGNOSIS — R350 Frequency of micturition: Secondary | ICD-10-CM | POA: Diagnosis not present

## 2022-12-06 LAB — POCT URINALYSIS DIP (CLINITEK)
Bilirubin, UA: NEGATIVE
Blood, UA: NEGATIVE
Glucose, UA: NEGATIVE mg/dL
Ketones, POC UA: NEGATIVE mg/dL
Nitrite, UA: NEGATIVE
POC PROTEIN,UA: NEGATIVE
Spec Grav, UA: 1.01 (ref 1.010–1.025)
Urobilinogen, UA: 0.2 E.U./dL
pH, UA: 7 (ref 5.0–8.0)

## 2022-12-06 NOTE — Patient Instructions (Addendum)
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  We discussed the fact that it is not life threatening but there are several treatment options. For treatment of pelvic organ prolapse, we discussed options for management including expectant management, conservative management, and surgical management, such as Kegels, a pessary, pelvic floor physical therapy, and specific surgical procedures.

## 2022-12-06 NOTE — Progress Notes (Signed)
[facility] New Patient Evaluation and Consultation  Referring Provider: Tylene Fantasia, NP PCP: Glenda Chroman, MD Date of Service: 12/06/2022  SUBJECTIVE Chief Complaint: New Patient (Initial Visit)  History of Present Illness: Kaitlin May is a 77 y.o. White or Caucasian female seen in consultation at the request of NP Bryan Lemma for evaluation of prolapse.    Review of records significant for: Has increased vaginal and rectal pressure. Rectocele noted on exam. History of colon resection for diverticulitis in 2017.   Urinary Symptoms: Does not leak urine.   Day time voids 3-4.  Nocturia: 3-4 times per night to void. Voiding dysfunction: she empties her bladder well.  does not use a catheter to empty bladder.  When urinating, she feels a weak stream  UTIs:  0  UTI's in the last year.   Denies history of blood in urine and kidney or bladder stones  Pelvic Organ Prolapse Symptoms:                  She Admits to a feeling of a bulge the vaginal area. It has been worsening for the last few months. She Denies seeing a bulge.  This bulge is bothersome. Has been referred to pelvic PT which she feels is helping some.  Has been lifting  more since her husband had been in rehab.   Bowel Symptom: Bowel movements: needs to take linzess to have a BM since her colon surgery in 2017 Stool consistency: loose Straining: no.  Splinting: no.  Incomplete evacuation: no.  She Admits to accidental bowel leakage / fecal incontinence  Occurs: when she takes the linzess  Consistency with leakage: loose Taking Linzess every other day. Prior to her surgery she took metamucil and miralax.   Sexual Function Sexually active: no.   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 Gala Romney   C.  difficile colitis    Diverticula of colon    Diverticulitis     Negative CTs in 2004, 2009, 2011, 2013. Positive for sigmoid diverticulitis in 03/2009   GERD (gastroesophageal reflux disease)    HTN (hypertension)    Migraines    uses demerol and phenergan for migraines due to severity of pain, nausea   Osteoporosis 11/2016   T score - 2.8   Rectal fistula    Seasonal allergies      Past Surgical History:   Past Surgical History:  Procedure Laterality Date   ABDOMINAL HYSTERECTOMY     endometrial hyperplasia, irreg bleeding   ANAL RECTAL MANOMETRY N/A 11/06/2016   Procedure: ANO RECTAL MANOMETRY;  Surgeon: Otis Brace, MD;  Location: [facility];  Service: Gastroenterology;  Laterality: N/A;   APPENDECTOMY     COLONOSCOPY  03/25/11   Dr. Elwyn Reach sided diverticula, normal rectum, tubular adenoma. Due for next TCS 03/2018.    COLONOSCOPY WITH PROPOFOL N/A 07/13/2015   Dr.Rourk- marked left colon colonic diverticulosis. no evidence of colonic neoplasm. single colonic polyp at the splenic flexure. bx= tubular adenoma   ESOPHAGEAL DILATION N/A 07/13/2015   Procedure: ESOPHAGEAL DILATION [place], [place];  Surgeon: Daneil Dolin, MD;  Location: [facility];  Service: Endoscopy;  Laterality: N/A;   ESOPHAGOGASTRODUODENOSCOPY (EGD) WITH PROPOFOL N/A 07/13/2015   Dr.Rourk- prominent schatzki's ring, o/w  normal appearing esohagus, hiatal hernia, multiple benign-appearing gastric polyps bx= benign fundic gland polyp   LAPAROSCOPIC SIGMOID COLECTOMY N/A 05/10/2016   Procedure: LAPAROSCOPIC SIGMOID COLECTOMY;  Surgeon: Excell Seltzer, MD;  Location: [facility];  Service: General;  Laterality: N/A;   OOPHORECTOMY     LSO   POLYPECTOMY N/A 07/13/2015   Procedure: POLYPECTOMY;  Surgeon: Daneil Dolin, MD;  Location: [facility];  Service: Endoscopy;  Laterality: N/A;   TONSILLECTOMY       Past OB/GYN History: OB History  Gravida Para Term Preterm AB Living  '2 2       2  '$ SAB IAB Ectopic Multiple  Live Births               # Outcome Date GA Lbr Len/2nd Weight Sex Delivery Anes PTL Lv  2 Para           1 Para             Vaginal deliveries: 2- believes she had forceps for one delivery, Cesarean section: 0 S/p hysterectomy   Medications: She has a current medication list which includes the following prescription(s): butalbital-acetaminophen-caffeine, dicyclomine, diltiazem, linaclotide, lorazepam, ondansetron, polyethylene glycol, promethazine, rabeprazole, and alendronate.   Allergies: Patient is allergic to other, dilaudid [hydromorphone hcl], and prednisone.   Social History:  Social History   Tobacco Use   Smoking status: Never   Smokeless tobacco: Never   Tobacco comments:    Never smoked  Vaping Use   Vaping Use: Never used  Substance Use Topics   Alcohol use: No    Alcohol/week: 0.0 standard drinks of alcohol   Drug use: No    Relationship status: married Regular exercise: Yes:     Family History:   Family History  Problem Relation Age of Onset   Melanoma Father        deceased at 82   Diabetes Mother    Dementia Mother        deceased at 95   Diabetes Brother    Heart failure Brother    Colon cancer Neg Hx      Review of Systems: Review of Systems  Constitutional:  Negative for fever, malaise/fatigue and weight loss.  Respiratory:  Negative for cough, shortness of breath and wheezing.   Cardiovascular:  Negative for chest pain, palpitations and leg swelling.  Gastrointestinal:  Negative for abdominal pain and blood in stool.  Genitourinary:  Negative for dysuria.  Musculoskeletal:  Negative for myalgias.  Skin:  Negative for rash.  Neurological:  Positive for headaches. Negative for dizziness.  Endo/Heme/Allergies:  Does not bruise/bleed easily.  Psychiatric/Behavioral:  Negative for depression. The patient is not nervous/anxious.      OBJECTIVE Physical Exam: Vitals:   12/06/22 1408 12/06/22 1444  BP: (!) 160/84 (!) 155/82  Pulse: 95    Weight: 188 lb (85.3 kg)     Physical Exam Constitutional:      General: She is not in acute distress. Pulmonary:     Effort: Pulmonary effort is normal.  Abdominal:     General: There is no distension.     Palpations: Abdomen is soft.     Tenderness: There is no abdominal tenderness. There is no rebound.  Musculoskeletal:        General: No swelling. Normal range of motion.  Skin:    General: Skin is warm and dry.     Findings: No rash.  Neurological:     Mental Status: She is alert and oriented to  person, place, and time.  Psychiatric:        Mood and Affect: Mood normal.        Behavior: Behavior normal.      GU / Detailed Urogynecologic Evaluation:  Pelvic Exam: Normal external female genitalia; Bartholin's and Skene's glands normal in appearance; urethral meatus normal in appearance, no urethral masses or discharge.   CST: negativ  s/p hysterectomy: Speculum exam reveals normal vaginal mucosa with  atrophy and normal vaginal cuff.  Adnexa no mass, fullness, tenderness.    Pelvic floor strength I/V, puborectalis II/V external anal sphincter II/V  Pelvic floor musculature: Right levator non-tender, Right obturator non-tender, Left levator non-tender, Left obturator non-tender  POP-Q:   POP-Q  -2                                            Aa   -2                                           Ba  -5.5                                              C   3                                            Gh  4                                            Pb  8                                            tvl   0                                            Ap  0                                            Bp                                                 D      Rectal Exam:  Normal sphincter tone, moderate distal rectocele, enterocoele not present, no rectal masses, no sign of dyssynergia when asking the patient to bear down.  Post-Void Residual (PVR) by Bladder  Scan: In order to evaluate bladder emptying, we discussed obtaining a postvoid residual and she agreed to this procedure.  Procedure: The ultrasound unit was placed on the patient's abdomen in the suprapubic region after the patient had voided. A PVR of 35 ml was obtained by bladder scan.  Laboratory Results: POC urine: trace leukocytes, otherwise negative   ASSESSMENT AND PLAN Ms. Moree is a 77 y.o. with:  1. Prolapse of posterior vaginal wall   2. Vaginal vault prolapse after hysterectomy   3. Frequency of urination   4. Incontinence of feces, unspecified fecal incontinence type    Stage I anterior, Stage II posterior, Stage I apical prolapse - For treatment of pelvic organ prolapse, we discussed options for management including expectant management, conservative management, and surgical management, such as Kegels, a pessary, pelvic floor physical therapy, and specific surgical procedures. - She is interested in a pessary and will return for a fitting.  - Handouts provided abt surgical options- recommend posterior repair with SSLF.   2. Fecal incontinence - Loose stools due to linzess use. Encouraged pt to follow up with PCP to try alternatives.  - She is working on pelvic PT  Return for pessary fitting.   Jaquita Folds, MD

## 2022-12-09 NOTE — Progress Notes (Addendum)
[facility]   Subjective:     Chief Complaint: Pessary Fitting Kaitlin May is a 77 y.o. female is here for pessary fitting due to uterine prolapse.)  History of Present Illness: Kaitlin May is a 77 y.o. female with stage II pelvic organ prolapse who presents today for a pessary fitting.    Past Medical History: Patient  has a past medical history of Acoustic neuroma ([place]), Adenoma of large intestine (03/25/11), C. difficile colitis, Diverticula of colon, Diverticulitis ( ), GERD (gastroesophageal reflux disease), HTN (hypertension), Migraines, Osteoporosis (11/2016), Rectal fistula, and Seasonal allergies.   Past Surgical History: She  has a past surgical history that includes Tonsillectomy; Appendectomy; Colonoscopy (03/25/11); Oophorectomy; Abdominal hysterectomy; Colonoscopy with propofol (N/A, 07/13/2015); Esophagogastroduodenoscopy (egd) with propofol (N/A, 07/13/2015); Esophageal dilation (N/A, 07/13/2015); polypectomy (N/A, 07/13/2015); Laparoscopic sigmoid colectomy (N/A, 05/10/2016); and Anal Rectal manometry (N/A, 11/06/2016).   Medications: She has a current medication list which includes the following prescription(s): alendronate, butalbital-acetaminophen-caffeine, dicyclomine, diltiazem, linaclotide, lorazepam, ondansetron, polyethylene glycol, promethazine, and rabeprazole.   Allergies: Patient is allergic to other, dilaudid [hydromorphone hcl], and prednisone.   Social History: Patient  reports that she has never smoked. She has never used smokeless tobacco. She reports that she does not drink alcohol and does not use drugs.      Objective:    BP 129/83   Pulse 88  Gen: No apparent distress, A&O x 3. Pelvic Exam: Normal external female genitalia; Bartholin's and Skene's glands normal in appearance; urethral meatus normal in appearance, no urethral masses or discharge.  Attempted a size 0 ring with knob which came out with urination Attempted a size  0 cube which came out with attempted BM  A size #2 cube pessary (Lot [id number]) was fitted. It was comfortable, stayed in place with valsalva and was an appropriate size on examination, with one finger fitting between the pessary and the vaginal walls.    Assessment/Plan:    Assessment: Kaitlin May is a 77 y.o. with stage II pelvic organ prolapse who presents for a pessary fitting.  An in depth discussion on where she was feeling her pain during fitting was had. At the introitus she was having a reported stinging pain. She felt this was the rectum and we talked through that the vaginal tissue at her opening was very sensitive and this was what she was feeling. Once the pessary was past the introitus she was able to tolerate insertion of the #2 cube pessary.   We also discussed her constipation and I encouraged her to drink more water and consider adding a fiber supplement such as benefiber as she seems to eat a high protein and high carb diet that is low in fiber based on what she reports during her visit.   We discussed the fact that the patient has vaginal dryness and needs lubrication with the pessary in place. Due to the vaginal atrophy a prescription for vaginal estrogen cream was sent in. She is to use this every night for two weeks and then twice weekly after.   Plan: She was fitted with a #2 cube pessary. She will keep the pessary in place until next visit. She will use estrogen.   Follow-up in 3 weeks for a pessary check or sooner as needed.  All questions were answered.    Berton Mount, NP

## 2022-12-13 ENCOUNTER — Encounter: Payer: Self-pay | Admitting: Obstetrics and Gynecology

## 2022-12-13 ENCOUNTER — Ambulatory Visit (INDEPENDENT_AMBULATORY_CARE_PROVIDER_SITE_OTHER): Payer: Medicare Other | Admitting: Obstetrics and Gynecology

## 2022-12-13 VITALS — BP 129/83 | HR 88

## 2022-12-13 DIAGNOSIS — N993 Prolapse of vaginal vault after hysterectomy: Secondary | ICD-10-CM | POA: Diagnosis not present

## 2022-12-13 DIAGNOSIS — K5904 Chronic idiopathic constipation: Secondary | ICD-10-CM | POA: Diagnosis not present

## 2022-12-13 DIAGNOSIS — N952 Postmenopausal atrophic vaginitis: Secondary | ICD-10-CM

## 2022-12-13 DIAGNOSIS — N816 Rectocele: Secondary | ICD-10-CM

## 2022-12-13 MED ORDER — ESTRADIOL 0.1 MG/GM VA CREA: 0.5000 g | TOPICAL_CREAM | VAGINAL | 11 refills | Status: DC

## 2022-12-13 NOTE — Patient Instructions (Signed)
Use the estrogen cream every night for two weeks and then use it twice a week.   You can call the office if you have concerns.   Follow up in 3 weeks for a pessary check. If everything is working well we will have you follow up in 3 months after that.

## 2022-12-16 ENCOUNTER — Telehealth: Payer: Self-pay | Admitting: Obstetrics and Gynecology

## 2022-12-16 NOTE — Telephone Encounter (Signed)
Called and spoke to patient as she had a difficult time Friday being fitted for a pessary. She reports she is able to do housework easier but does feel a slight pressure related to needing to have a bowel movement. We discussed that once she has a bowel movement that she should feel less pressure as the cube pessary is trying to support the rectocele on the posterior vaginal wall. Overall she feels she is doing well but she has not used her estrogen cream yet. We discussed the importance of using the vaginal estrogen cream for the health of her vaginal tissue and she reported she will use it this morning.

## 2022-12-17 DIAGNOSIS — K5909 Other constipation: Secondary | ICD-10-CM | POA: Diagnosis not present

## 2022-12-17 DIAGNOSIS — Z7409 Other reduced mobility: Secondary | ICD-10-CM | POA: Diagnosis not present

## 2022-12-17 DIAGNOSIS — N8110 Cystocele, unspecified: Secondary | ICD-10-CM | POA: Diagnosis not present

## 2022-12-17 DIAGNOSIS — N816 Rectocele: Secondary | ICD-10-CM | POA: Diagnosis not present

## 2022-12-17 DIAGNOSIS — R278 Other lack of coordination: Secondary | ICD-10-CM | POA: Diagnosis not present

## 2022-12-18 ENCOUNTER — Telehealth: Payer: Self-pay | Admitting: Obstetrics and Gynecology

## 2022-12-18 NOTE — Telephone Encounter (Signed)
Patient reports she is having difficulty still with her bowel movements. She states she is taking Linzess and is still only having small bits of stool come out. Patient reports she did Miralax this morning. Reports she is drinking more water.  Encouraged her to do some warm prune juice and another dose of Miralax as it is promising she had some movement.  Also encouraged her to do her estrogen cream nightly.

## 2022-12-31 DIAGNOSIS — E7800 Pure hypercholesterolemia, unspecified: Secondary | ICD-10-CM | POA: Diagnosis not present

## 2022-12-31 DIAGNOSIS — Z0000 Encounter for general adult medical examination without abnormal findings: Secondary | ICD-10-CM | POA: Diagnosis not present

## 2022-12-31 DIAGNOSIS — Z79899 Other long term (current) drug therapy: Secondary | ICD-10-CM | POA: Diagnosis not present

## 2022-12-31 DIAGNOSIS — I10 Essential (primary) hypertension: Secondary | ICD-10-CM | POA: Diagnosis not present

## 2022-12-31 DIAGNOSIS — R5383 Other fatigue: Secondary | ICD-10-CM | POA: Diagnosis not present

## 2022-12-31 DIAGNOSIS — Z7189 Other specified counseling: Secondary | ICD-10-CM | POA: Diagnosis not present

## 2022-12-31 DIAGNOSIS — Z1339 Encounter for screening examination for other mental health and behavioral disorders: Secondary | ICD-10-CM | POA: Diagnosis not present

## 2022-12-31 DIAGNOSIS — Z299 Encounter for prophylactic measures, unspecified: Secondary | ICD-10-CM | POA: Diagnosis not present

## 2022-12-31 DIAGNOSIS — Z1331 Encounter for screening for depression: Secondary | ICD-10-CM | POA: Diagnosis not present

## 2022-12-31 NOTE — Progress Notes (Signed)
[facility]   Subjective:     Chief Complaint:  Chief Complaint  Patient presents with   Pessary Check    Kaitlin May is a 77 y.o. female is here for a pessary check.   History of Present Illness: Kaitlin May is a 77 y.o. female with stage II pelvic organ prolapse who presents for a pessary check. She is using a size #2 cube pessary. The pessary has been working well and she has no complaints. She is using vaginal estrogen. She denies vaginal bleeding.  Overall she reports she is doing well. She feels like she is potentially having some easier bowel movements but "feels something protruding from the rectum".   Endorses she has been functionally able to do more around the house. She is able to do housework and overall feels things have improved.   Past Medical History: Patient  has a past medical history of Acoustic neuroma ([place]), Adenoma of large intestine (03/25/11), C. difficile colitis, Diverticula of colon, Diverticulitis ( ), GERD (gastroesophageal reflux disease), HTN (hypertension), Migraines, Osteoporosis (11/2016), Rectal fistula, and Seasonal allergies.   Past Surgical History: She  has a past surgical history that includes Tonsillectomy; Appendectomy; Colonoscopy (03/25/11); Oophorectomy; Abdominal hysterectomy; Colonoscopy with propofol (N/A, 07/13/2015); Esophagogastroduodenoscopy (egd) with propofol (N/A, 07/13/2015); Esophageal dilation (N/A, 07/13/2015); polypectomy (N/A, 07/13/2015); Laparoscopic sigmoid colectomy (N/A, 05/10/2016); and Anal Rectal manometry (N/A, 11/06/2016).   Medications: She has a current medication list which includes the following prescription(s): alendronate, butalbital-acetaminophen-caffeine, dicyclomine, diltiazem, estradiol, hydrocortisone, linaclotide, lorazepam, ondansetron, polyethylene glycol, promethazine, and rabeprazole.   Allergies: Patient is allergic to other, dilaudid [hydromorphone hcl], and prednisone.   Social  History: Patient  reports that she has never smoked. She has never used smokeless tobacco. She reports that she does not drink alcohol and does not use drugs.      Objective:    Physical Exam: BP 136/79   Pulse 87  Gen: No apparent distress, A&O x 3. Detailed Urogynecologic Evaluation:  Pelvic Exam: Normal external female genitalia; Bartholin's and Skene's glands normal in appearance; urethral meatus normal in appearance, no urethral masses or discharge. The pessary was noted to be in place. It was removed and cleaned. Speculum exam revealed no lesions in the vagina. The pessary was replaced. It was comfortable to the patient and fit well.   Patient also noted to have a large hemorrhoid that is thrombosed in appearance. We discussed that this is normally managed by GI or general surgery.    Assessment/Plan:    Assessment: Kaitlin May is a 77 y.o. with stage II pelvic organ prolapse here for a pessary check. She is doing well.  Plan: She will keep the pessary in place until next visit. She will continue to use estrogen. She will follow-up in 3 months for a pessary check or sooner as needed.   Encouraged her to do some preparation H or suppositories for her hemorrhoid that is quite large. We used a mirror so she could see the hemorrhoid and understand the sensations she is feeling.    All questions were answered.

## 2023-01-03 ENCOUNTER — Ambulatory Visit (INDEPENDENT_AMBULATORY_CARE_PROVIDER_SITE_OTHER): Payer: Medicare Other | Admitting: Obstetrics and Gynecology

## 2023-01-03 ENCOUNTER — Encounter: Payer: Self-pay | Admitting: Obstetrics and Gynecology

## 2023-01-03 VITALS — BP 136/79 | HR 87

## 2023-01-03 DIAGNOSIS — K649 Unspecified hemorrhoids: Secondary | ICD-10-CM | POA: Diagnosis not present

## 2023-01-03 DIAGNOSIS — N952 Postmenopausal atrophic vaginitis: Secondary | ICD-10-CM

## 2023-01-03 DIAGNOSIS — N993 Prolapse of vaginal vault after hysterectomy: Secondary | ICD-10-CM

## 2023-01-03 DIAGNOSIS — N816 Rectocele: Secondary | ICD-10-CM

## 2023-01-03 DIAGNOSIS — K5904 Chronic idiopathic constipation: Secondary | ICD-10-CM

## 2023-01-03 DIAGNOSIS — Z4689 Encounter for fitting and adjustment of other specified devices: Secondary | ICD-10-CM

## 2023-01-03 MED ORDER — HYDROCORTISONE ACETATE 25 MG RE SUPP: 25.0000 mg | Freq: Two times a day (BID) | RECTAL | 0 refills | Status: DC

## 2023-01-03 NOTE — Patient Instructions (Addendum)
Place the suppository at night to help shrink the hemorrhoid you can use them up to twice a day if you have a large bowel movement. If the suppositories are too expensive you can use a cream that the drug store recommends.   Use the estrogen cream x2 weekly. (Tuesday and Saturday)   Come back in 3 months for a pessary check

## 2023-01-20 ENCOUNTER — Telehealth: Payer: Self-pay

## 2023-01-20 NOTE — Telephone Encounter (Signed)
Patient had taken suppositories two weeks ago and needed a cream to helping with hemorrhoid healing. A cream has been sent to patients pharmacy ([facility]  Hc cream).

## 2023-01-23 ENCOUNTER — Telehealth: Payer: Self-pay | Admitting: Obstetrics and Gynecology

## 2023-01-23 DIAGNOSIS — K649 Unspecified hemorrhoids: Secondary | ICD-10-CM

## 2023-01-23 MED ORDER — HYDROCORTISONE (PERIANAL) 2.5 % EX CREA: 1.0000 | TOPICAL_CREAM | Freq: Two times a day (BID) | CUTANEOUS | 0 refills | Status: DC

## 2023-01-23 NOTE — Telephone Encounter (Signed)
Patient called and stated that the pharmacy did not have a cream to give her for her hemorrhoids. CMA Laticia had spoken to pharmacist on Monday with NP present and a verbal order was given for hemorrhoid cream. Pharmacy reports they cannot find the order. New order sent for hemorrhoid cream to use nightly and after bowel movements.

## 2023-02-24 ENCOUNTER — Ambulatory Visit (INDEPENDENT_AMBULATORY_CARE_PROVIDER_SITE_OTHER): Payer: Medicare Other | Admitting: Obstetrics and Gynecology

## 2023-02-24 ENCOUNTER — Encounter: Payer: Self-pay | Admitting: Obstetrics and Gynecology

## 2023-02-24 VITALS — BP 110/71 | HR 83

## 2023-02-24 DIAGNOSIS — Z4689 Encounter for fitting and adjustment of other specified devices: Secondary | ICD-10-CM

## 2023-02-24 DIAGNOSIS — N993 Prolapse of vaginal vault after hysterectomy: Secondary | ICD-10-CM | POA: Diagnosis not present

## 2023-02-24 NOTE — Progress Notes (Signed)
[facility]   Subjective:     Chief Complaint:  Chief Complaint  Patient presents with   Follow-up    Kaitlin May is a 77 y.o. female is here for pessary issues.   History of Present Illness: Kaitlin May is a 77 y.o. female with stage II pelvic organ prolapse who presents for a pessary check. She is using a size #2 cube pessary. She has been feeling the pessary at the opening of the vagina. She has noticed some bleeding over the last 10 days- red and brown color. Not sure if the brown is stool. At times has more bleeding but not filling pads. Has improved since yesterday. She was using vaginal estrogen cream, but has not in the last week.   Past Medical History: Patient  has a past medical history of Acoustic neuroma, Adenoma of large intestine (03/25/11), C. difficile colitis, Diverticula of colon, Diverticulitis ( ), GERD (gastroesophageal reflux disease), HTN (hypertension), Migraines, Osteoporosis (11/2016), Rectal fistula, and Seasonal allergies.   Past Surgical History: She  has a past surgical history that includes Tonsillectomy; Appendectomy; Colonoscopy (03/25/11); Oophorectomy; Abdominal hysterectomy; Colonoscopy with propofol (N/A, 07/13/2015); Esophagogastroduodenoscopy (egd) with propofol (N/A, 07/13/2015); Esophageal dilation (N/A, 07/13/2015); polypectomy (N/A, 07/13/2015); Laparoscopic sigmoid colectomy (N/A, 05/10/2016); and Anal Rectal manometry (N/A, 11/06/2016).   Medications: She has a current medication list which includes the following prescription(s): alendronate, butalbital-acetaminophen-caffeine, dicyclomine, diltiazem, estradiol, hydrocortisone, linaclotide, lorazepam, ondansetron, polyethylene glycol, promethazine, and rabeprazole.   Allergies: Patient is allergic to other, dilaudid [hydromorphone hcl], and prednisone.   Social History: Patient  reports that she has never smoked. She has never used smokeless tobacco. She reports that she does not  drink alcohol and does not use drugs.      Objective:    Physical Exam: BP 110/71   Pulse 83  Gen: No apparent distress, A&O x 3. Detailed Urogynecologic Evaluation:  Pelvic Exam: Normal external female genitalia; Bartholin's and Skene's glands normal in appearance; urethral meatus normal in appearance, no urethral masses or discharge. The pessary was noted to be in place. It was removed and cleaned. Speculum exam revealed very mild excoriation at vaginal apex, no active bleeding but treated with silver nitrate The pessary was replaced. It was comfortable to the patient and fit well.   POP-Q (12/06/22)   -2                                            Aa   -2                                           Ba   -5.5                                              C    3                                            Gh   4  Pb   8                                            tvl    0                                            Ap   0                                            Bp                                                  D       Laboratory Results: POC urine: negative    Assessment/Plan:    Assessment: Kaitlin May is a 77 y.o. with stage II pelvic organ prolapse here for a pessary check.   Plan: - Reassurance provided. No significant bleeding noted on exam today and pessary was  in place. We discussed that if pessary does move down to the opening, she can gently push back up with her finger - continue with vaginal estrogen twice a week.   Return for regularly scheduled pessary check in a month. Then can resume q3 months if all is well.   Marguerita Beards, MD   Time spent: I spent 20 minutes dedicated to the care of this patient on the date of this encounter to include pre-visit review of records, face-to-face time with the patient and post visit documentation.

## 2023-03-07 DIAGNOSIS — W57XXXA Bitten or stung by nonvenomous insect and other nonvenomous arthropods, initial encounter: Secondary | ICD-10-CM | POA: Diagnosis not present

## 2023-03-07 DIAGNOSIS — Z299 Encounter for prophylactic measures, unspecified: Secondary | ICD-10-CM | POA: Diagnosis not present

## 2023-03-07 DIAGNOSIS — R590 Localized enlarged lymph nodes: Secondary | ICD-10-CM | POA: Diagnosis not present

## 2023-03-07 DIAGNOSIS — S1096XA Insect bite of unspecified part of neck, initial encounter: Secondary | ICD-10-CM | POA: Diagnosis not present

## 2023-04-03 ENCOUNTER — Ambulatory Visit: Payer: Medicare Other | Admitting: Obstetrics and Gynecology

## 2023-04-04 DIAGNOSIS — R110 Nausea: Secondary | ICD-10-CM | POA: Diagnosis not present

## 2023-04-04 DIAGNOSIS — M545 Low back pain, unspecified: Secondary | ICD-10-CM | POA: Diagnosis not present

## 2023-04-04 DIAGNOSIS — G43909 Migraine, unspecified, not intractable, without status migrainosus: Secondary | ICD-10-CM | POA: Diagnosis not present

## 2023-04-04 DIAGNOSIS — I10 Essential (primary) hypertension: Secondary | ICD-10-CM | POA: Diagnosis not present

## 2023-04-04 DIAGNOSIS — K219 Gastro-esophageal reflux disease without esophagitis: Secondary | ICD-10-CM | POA: Diagnosis not present

## 2023-04-04 DIAGNOSIS — Z299 Encounter for prophylactic measures, unspecified: Secondary | ICD-10-CM | POA: Diagnosis not present

## 2023-05-06 ENCOUNTER — Ambulatory Visit: Payer: Medicare Other | Admitting: Obstetrics and Gynecology

## 2023-05-07 ENCOUNTER — Ambulatory Visit: Payer: Medicare Other | Admitting: Obstetrics and Gynecology

## 2023-05-08 NOTE — Progress Notes (Signed)
[facility]   Subjective:     Chief Complaint:  Chief Complaint  Patient presents with   Pessary Check    Kaitlin May is a 77 y.o. female is here for pessary check.   History of Present Illness: Kaitlin May is a 77 y.o. female with stage II pelvic organ prolapse who presents for a pessary check. She is using a size #2 cube pessary. The pessary has been working well and she has no complaints. She is using vaginal estrogen but reports not consistently or . She denies vaginal bleeding.  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 difficile colitis, Diverticula of colon, Diverticulitis ( ), GERD (gastroesophageal reflux disease), HTN (hypertension), Migraines, Osteoporosis (11/2016), Rectal fistula, and Seasonal allergies.   Past Surgical History: She  has a past surgical history that includes Tonsillectomy; Appendectomy; Colonoscopy (03/25/11); Oophorectomy; Abdominal hysterectomy; Colonoscopy with propofol (N/A, 07/13/2015); Esophagogastroduodenoscopy (egd) with propofol (N/A, 07/13/2015); Esophageal dilation (N/A, 07/13/2015); polypectomy (N/A, 07/13/2015); Laparoscopic sigmoid colectomy (N/A, 05/10/2016); and Anal Rectal manometry (N/A, 11/06/2016).   Medications: She has a current medication list which includes the following prescription(s): alendronate, baclofen, butalbital-acetaminophen-caffeine, diltiazem, estradiol, hydrocortisone, linaclotide, lorazepam, ondansetron, polyethylene glycol, promethazine, rabeprazole, and sulfamethoxazole-trimethoprim.   Allergies: Patient is allergic to other, dilaudid [hydromorphone hcl], and prednisone.   Social History: Patient  reports that she has never smoked. She has never used smokeless tobacco. She reports that she does not drink alcohol and does not use drugs.      Objective:    Physical Exam: BP 125/76   Pulse 99  Gen: No apparent  distress, A&O x 3. Detailed Urogynecologic Evaluation:  Pelvic Exam: Normal external female genitalia; Bartholin's and Skene's glands normal in appearance; urethral meatus normal in appearance, no urethral masses or discharge. The pessary was noted to be in place. It was removed and cleaned. Speculum exam revealed erythema in the vagina. The pessary was not replaced.   Hemorrhoids have improved since last visit.   Laboratory Results: Urine dipstick shows:Positive for moderate blood, protein, and large leukocytes.     Assessment/Plan:    Assessment: [name] is a 77 y.o. with stage II pelvic organ prolapse here for a pessary check. She is doing well.  Plan:  Patient requested the pessary not be re-inserted. It was cleaned and placed in a baggie for her.   Encouraged her to use the estrogen cream twice weekly and to get it further into th vagina.   Encouraged her to use the Anusol cream for the rectum when her hemorrhoids are bulging out.   Patient has a positive POC urine for UTI. Will send in Bactrim x2 daily for 5 days.   Patient is unsure what she wants to do in regards to the prolapse. I discussed with her that I believe the lower abdominal/pelvic pain she is experiencing was more-so related to her UTI today but she did not want the pessary back in.   With her permission I called and spoke to her emergency contact, sister Olegario Messier, and explained the plan of care and concerns at this visit as she seemed confused about instruction. Her sister reports she will check on her and try to assist with her care.

## 2023-05-09 ENCOUNTER — Encounter: Payer: Self-pay | Admitting: Obstetrics and Gynecology

## 2023-05-09 ENCOUNTER — Ambulatory Visit (INDEPENDENT_AMBULATORY_CARE_PROVIDER_SITE_OTHER): Payer: Medicare Other | Admitting: Obstetrics and Gynecology

## 2023-05-09 ENCOUNTER — Other Ambulatory Visit (HOSPITAL_COMMUNITY)
Admission: RE | Admit: 2023-05-09 | Discharge: 2023-05-09 | Disposition: A | Discharge status: Home / Self Care | Payer: Medicare Other | Source: Other Acute Inpatient Hospital | Attending: Obstetrics and Gynecology | Admitting: Obstetrics and Gynecology

## 2023-05-09 VITALS — BP 125/76 | HR 99

## 2023-05-09 DIAGNOSIS — R82998 Other abnormal findings in urine: Secondary | ICD-10-CM | POA: Insufficient documentation

## 2023-05-09 DIAGNOSIS — R350 Frequency of micturition: Secondary | ICD-10-CM | POA: Diagnosis not present

## 2023-05-09 DIAGNOSIS — Z4689 Encounter for fitting and adjustment of other specified devices: Secondary | ICD-10-CM

## 2023-05-09 DIAGNOSIS — R319 Hematuria, unspecified: Secondary | ICD-10-CM

## 2023-05-09 LAB — POCT URINALYSIS DIPSTICK
Bilirubin, UA: NEGATIVE
Glucose, UA: NEGATIVE
Ketones, UA: NEGATIVE
Nitrite, UA: POSITIVE
Protein, UA: POSITIVE — AB
Spec Grav, UA: 1.015 (ref 1.010–1.025)
Urobilinogen, UA: 0.2 E.U./dL
pH, UA: 6 (ref 5.0–8.0)

## 2023-05-09 MED ORDER — SULFAMETHOXAZOLE-TRIMETHOPRIM 800-160 MG PO TABS: 1.0000 | ORAL_TABLET | Freq: Two times a day (BID) | ORAL | 0 refills | Status: AC

## 2023-05-09 NOTE — Patient Instructions (Addendum)
We will leave the pessary out but your prolapse will come down.  You have a uti today, I have sent some antibiotics to the pharmacy today.   The Anusol cream is for the rectum  The estrogen cream is for the vagina and you need to try to get it deeper in the vagina to help your tissues.

## 2023-05-10 LAB — URINE CULTURE

## 2023-05-11 HISTORY — PX: COLON SURGERY: SHX602

## 2023-05-11 LAB — URINE CULTURE: Culture: >=100000 — AB

## 2023-05-12 DIAGNOSIS — H2513 Age-related nuclear cataract, bilateral: Secondary | ICD-10-CM | POA: Diagnosis not present

## 2023-05-12 DIAGNOSIS — H43812 Vitreous degeneration, left eye: Secondary | ICD-10-CM | POA: Diagnosis not present

## 2023-05-19 ENCOUNTER — Ambulatory Visit: Payer: Medicare Other | Admitting: Obstetrics and Gynecology

## 2023-05-20 DIAGNOSIS — Z299 Encounter for prophylactic measures, unspecified: Secondary | ICD-10-CM | POA: Diagnosis not present

## 2023-05-20 DIAGNOSIS — R350 Frequency of micturition: Secondary | ICD-10-CM | POA: Diagnosis not present

## 2023-05-20 DIAGNOSIS — I10 Essential (primary) hypertension: Secondary | ICD-10-CM | POA: Diagnosis not present

## 2023-05-20 DIAGNOSIS — N390 Urinary tract infection, site not specified: Secondary | ICD-10-CM | POA: Diagnosis not present

## 2023-05-30 DIAGNOSIS — H2513 Age-related nuclear cataract, bilateral: Secondary | ICD-10-CM | POA: Diagnosis not present

## 2023-05-30 DIAGNOSIS — H25013 Cortical age-related cataract, bilateral: Secondary | ICD-10-CM | POA: Diagnosis not present

## 2023-05-30 DIAGNOSIS — H2511 Age-related nuclear cataract, right eye: Secondary | ICD-10-CM | POA: Diagnosis not present

## 2023-07-04 DIAGNOSIS — I10 Essential (primary) hypertension: Secondary | ICD-10-CM | POA: Diagnosis not present

## 2023-07-04 DIAGNOSIS — F419 Anxiety disorder, unspecified: Secondary | ICD-10-CM | POA: Diagnosis not present

## 2023-07-04 DIAGNOSIS — N816 Rectocele: Secondary | ICD-10-CM | POA: Diagnosis not present

## 2023-07-04 DIAGNOSIS — M545 Low back pain, unspecified: Secondary | ICD-10-CM | POA: Diagnosis not present

## 2023-07-04 DIAGNOSIS — R110 Nausea: Secondary | ICD-10-CM | POA: Diagnosis not present

## 2023-07-04 DIAGNOSIS — Z299 Encounter for prophylactic measures, unspecified: Secondary | ICD-10-CM | POA: Diagnosis not present

## 2023-08-04 DIAGNOSIS — N816 Rectocele: Secondary | ICD-10-CM | POA: Diagnosis not present

## 2023-08-04 DIAGNOSIS — N8110 Cystocele, unspecified: Secondary | ICD-10-CM | POA: Diagnosis not present

## 2023-08-05 DIAGNOSIS — I10 Essential (primary) hypertension: Secondary | ICD-10-CM | POA: Diagnosis not present

## 2023-08-05 DIAGNOSIS — G43909 Migraine, unspecified, not intractable, without status migrainosus: Secondary | ICD-10-CM | POA: Diagnosis not present

## 2023-08-05 DIAGNOSIS — Z299 Encounter for prophylactic measures, unspecified: Secondary | ICD-10-CM | POA: Diagnosis not present

## 2023-08-08 ENCOUNTER — Ambulatory Visit: Payer: Medicare Other | Admitting: Obstetrics and Gynecology

## 2023-08-21 DIAGNOSIS — H2511 Age-related nuclear cataract, right eye: Secondary | ICD-10-CM | POA: Diagnosis not present

## 2023-08-22 DIAGNOSIS — H2512 Age-related nuclear cataract, left eye: Secondary | ICD-10-CM | POA: Diagnosis not present

## 2023-08-22 DIAGNOSIS — H25012 Cortical age-related cataract, left eye: Secondary | ICD-10-CM | POA: Diagnosis not present

## 2023-09-04 DIAGNOSIS — H2512 Age-related nuclear cataract, left eye: Secondary | ICD-10-CM | POA: Diagnosis not present

## 2023-09-04 DIAGNOSIS — H25012 Cortical age-related cataract, left eye: Secondary | ICD-10-CM | POA: Diagnosis not present

## 2023-09-23 DIAGNOSIS — R110 Nausea: Secondary | ICD-10-CM | POA: Diagnosis not present

## 2023-09-23 DIAGNOSIS — Z299 Encounter for prophylactic measures, unspecified: Secondary | ICD-10-CM | POA: Diagnosis not present

## 2023-09-23 DIAGNOSIS — M25511 Pain in right shoulder: Secondary | ICD-10-CM | POA: Diagnosis not present

## 2023-09-23 DIAGNOSIS — I10 Essential (primary) hypertension: Secondary | ICD-10-CM | POA: Diagnosis not present

## 2023-09-23 DIAGNOSIS — F419 Anxiety disorder, unspecified: Secondary | ICD-10-CM | POA: Diagnosis not present

## 2023-09-26 DIAGNOSIS — H04121 Dry eye syndrome of right lacrimal gland: Secondary | ICD-10-CM | POA: Diagnosis not present

## 2023-10-03 DIAGNOSIS — H02053 Trichiasis without entropian right eye, unspecified eyelid: Secondary | ICD-10-CM | POA: Diagnosis not present

## 2023-10-03 DIAGNOSIS — H04121 Dry eye syndrome of right lacrimal gland: Secondary | ICD-10-CM | POA: Diagnosis not present

## 2023-10-03 DIAGNOSIS — Z961 Presence of intraocular lens: Secondary | ICD-10-CM | POA: Diagnosis not present

## 2024-01-13 DIAGNOSIS — Z1331 Encounter for screening for depression: Secondary | ICD-10-CM | POA: Diagnosis not present

## 2024-01-13 DIAGNOSIS — Z79899 Other long term (current) drug therapy: Secondary | ICD-10-CM | POA: Diagnosis not present

## 2024-01-13 DIAGNOSIS — R5383 Other fatigue: Secondary | ICD-10-CM | POA: Diagnosis not present

## 2024-01-13 DIAGNOSIS — Z299 Encounter for prophylactic measures, unspecified: Secondary | ICD-10-CM | POA: Diagnosis not present

## 2024-01-13 DIAGNOSIS — Z7189 Other specified counseling: Secondary | ICD-10-CM | POA: Diagnosis not present

## 2024-01-13 DIAGNOSIS — Z1339 Encounter for screening examination for other mental health and behavioral disorders: Secondary | ICD-10-CM | POA: Diagnosis not present

## 2024-01-13 DIAGNOSIS — Z6832 Body mass index (BMI) 32.0-32.9, adult: Secondary | ICD-10-CM | POA: Diagnosis not present

## 2024-01-13 DIAGNOSIS — E7800 Pure hypercholesterolemia, unspecified: Secondary | ICD-10-CM | POA: Diagnosis not present

## 2024-01-13 DIAGNOSIS — Z0000 Encounter for general adult medical examination without abnormal findings: Secondary | ICD-10-CM | POA: Diagnosis not present

## 2024-01-13 DIAGNOSIS — I10 Essential (primary) hypertension: Secondary | ICD-10-CM | POA: Diagnosis not present

## 2024-04-05 DIAGNOSIS — H16142 Punctate keratitis, left eye: Secondary | ICD-10-CM | POA: Diagnosis not present

## 2024-04-09 DIAGNOSIS — R110 Nausea: Secondary | ICD-10-CM | POA: Diagnosis not present

## 2024-04-09 DIAGNOSIS — I10 Essential (primary) hypertension: Secondary | ICD-10-CM | POA: Diagnosis not present

## 2024-04-09 DIAGNOSIS — F419 Anxiety disorder, unspecified: Secondary | ICD-10-CM | POA: Diagnosis not present

## 2024-04-09 DIAGNOSIS — M545 Low back pain, unspecified: Secondary | ICD-10-CM | POA: Diagnosis not present

## 2024-04-09 DIAGNOSIS — Z299 Encounter for prophylactic measures, unspecified: Secondary | ICD-10-CM | POA: Diagnosis not present

## 2024-06-03 ENCOUNTER — Encounter: Payer: Self-pay | Admitting: Obstetrics and Gynecology

## 2024-06-03 ENCOUNTER — Ambulatory Visit (INDEPENDENT_AMBULATORY_CARE_PROVIDER_SITE_OTHER): Admitting: Obstetrics and Gynecology

## 2024-06-03 ENCOUNTER — Ambulatory Visit: Payer: Self-pay | Admitting: Obstetrics and Gynecology

## 2024-06-03 VITALS — BP 152/92 | HR 95 | Ht 65.0 in | Wt 198.0 lb

## 2024-06-03 DIAGNOSIS — N8110 Cystocele, unspecified: Secondary | ICD-10-CM | POA: Diagnosis not present

## 2024-06-03 DIAGNOSIS — Z1231 Encounter for screening mammogram for malignant neoplasm of breast: Secondary | ICD-10-CM

## 2024-06-03 DIAGNOSIS — M8000XS Age-related osteoporosis with current pathological fracture, unspecified site, sequela: Secondary | ICD-10-CM | POA: Diagnosis not present

## 2024-06-03 DIAGNOSIS — N952 Postmenopausal atrophic vaginitis: Secondary | ICD-10-CM

## 2024-06-03 DIAGNOSIS — Z01419 Encounter for gynecological examination (general) (routine) without abnormal findings: Secondary | ICD-10-CM

## 2024-06-03 DIAGNOSIS — M816 Localized osteoporosis [Lequesne]: Secondary | ICD-10-CM

## 2024-06-03 LAB — COMPREHENSIVE METABOLIC PANEL WITH GFR
AG Ratio: 1.9 (calc) (ref 1.0–2.5)
ALT: 13 U/L (ref 6–29)
AST: 17 U/L (ref 10–35)
Albumin: 4.8 g/dL (ref 3.6–5.1)
Alkaline phosphatase (APISO): 128 U/L (ref 37–153)
BUN/Creatinine Ratio: 17 (calc) (ref 6–22)
BUN: 17 mg/dL (ref 7–25)
CO2: 27 mmol/L (ref 20–32)
Calcium: 9.7 mg/dL (ref 8.6–10.4)
Chloride: 104 mmol/L (ref 98–110)
Creat: 1.03 mg/dL — ABNORMAL HIGH (ref 0.60–0.95)
Globulin: 2.5 g/dL (ref 1.9–3.7)
Glucose, Bld: 88 mg/dL (ref 65–99)
Potassium: 4.3 mmol/L (ref 3.5–5.3)
Sodium: 140 mmol/L (ref 135–146)
Total Bilirubin: 0.4 mg/dL (ref 0.2–1.2)
Total Protein: 7.3 g/dL (ref 6.1–8.1)
eGFR: 55 mL/min/1.73m2 — ABNORMAL LOW (ref >=60)

## 2024-06-03 LAB — VITAMIN D 25 HYDROXY (VIT D DEFICIENCY, FRACTURES): Vit D, 25-Hydroxy: 48 ng/mL (ref 30–100)

## 2024-06-03 NOTE — Patient Instructions (Addendum)
 UROGYN REFERRAL: Dr. Bernardine Gillis and Dr. Marilynne at [facility] with [facility] office: [address], [phone]  The referral was placed but it may take a couple of weeks for the visit to be coordinated.   Locations you can schedule your bone scan are:  The [facility] bone scan location scheduling line is [phone]  [facility] is [phone]  [place] [phone]  Another option if they are behind is [facility] and their number is [phone].  I can send the referral if you want to go there.    Please let me know if you have any trouble scheduling. This is important for management of osteoporosis or osteopenia.   I can sit down with you after the scan to discuss results and treatment.  Dr. Glennon

## 2024-06-03 NOTE — Progress Notes (Signed)
 78 y.o. y.o. female here for annual exam. No LMP recorded. Patient has had a hysterectomy.   G2P2 for breast and pelvic exam.  History of osteoporosis with prescription for alendronate  provided.  She never started taking for fear of GI side effects, per last note.  Today, patient is not sure how long she took it but is not currently taking it.   Last bone scan in 2018 and with severe osteoporosis. Patient with remote left leg fracture from fall and fractures in toes.   She needs bone builder and evenity/prolia r/b/a/I was reviewed with her and her daughter and they would like to proceed with this.  Patient cannot take an oral medication.  Labs were drawn today.  To place PA.  Gait is also unstable and patient is at risk for falls and the mortality risk if a fracture occurs.  Also having bothersome symptoms with her rectocele and would like to discuss surgical options. She could not tolerate the pessary placed at Dr. Susen office at her last visit with urogyn.  She remembers bleeding after trying the pessary and this was not pleasant for the patient. She reports splinting and feeling a bulge.  Her husband of 62 years is at [facility] and is not doing well.  She is behind on her bone scan and MMG due to her care for him and loss of memory. Her daughter is with her today and she lives a house down from her daughter.     Body mass index is 32.95 kg/m.      No data to display          Height 5' 5 (1.651 m), weight 198 lb (89.8 kg).  No results found for: DIAGPAP, HPVHIGH, ADEQPAP  GYN HISTORY: No results found for: DIAGPAP, HPVHIGH, ADEQPAP  OB History  Gravida Para Term Preterm AB Living  2 2    2   SAB IAB Ectopic Multiple Live Births          # Outcome Date GA Lbr Len/2nd Weight Sex Type Anes PTL Lv  2 Para           1 Para             Past Medical History:  Diagnosis Date   Acoustic neuroma (HCC)    in situ   Adenoma of large intestine 03/25/11    tcs by Dr. Shaaron   C. difficile colitis    Diverticula of colon    Diverticulitis     Negative CTs in 2004, 2009, 2011, 2013. Positive for sigmoid diverticulitis in 03/2009   GERD (gastroesophageal reflux disease)    HTN (hypertension)    Migraines    uses demerol and phenergan  for migraines due to severity of pain, nausea   Osteoporosis 11/2016   T score - 2.8   Rectal fistula    Seasonal allergies     Past Surgical History:  Procedure Laterality Date   ABDOMINAL HYSTERECTOMY     endometrial hyperplasia, irreg bleeding   ANAL RECTAL MANOMETRY N/A 11/06/2016   Procedure: ANO RECTAL MANOMETRY;  Surgeon: Layla Lah, MD;  Location: [facility];  Service: Gastroenterology;  Laterality: N/A;   APPENDECTOMY     COLONOSCOPY  03/25/11   Dr. Smitty sided diverticula, normal rectum, tubular adenoma. Due for next TCS 03/2018.    COLONOSCOPY WITH PROPOFOL  N/A 07/13/2015   Dr.Rourk- marked left colon colonic diverticulosis. no evidence of colonic neoplasm. single colonic polyp at the splenic flexure. bx= tubular adenoma  ESOPHAGEAL DILATION N/A 07/13/2015   Procedure: ESOPHAGEAL DILATION 54 FRENCH, 56 FRENCH;  Surgeon: Lamar CHRISTELLA Hollingshead, MD;  Location: [facility];  Service: Endoscopy;  Laterality: N/A;   ESOPHAGOGASTRODUODENOSCOPY (EGD) WITH PROPOFOL  N/A 07/13/2015   Dr.Rourk- prominent schatzki's ring, o/w normal appearing esohagus, hiatal hernia, multiple benign-appearing gastric polyps bx= benign fundic gland polyp   LAPAROSCOPIC SIGMOID COLECTOMY N/A 05/10/2016   Procedure: LAPAROSCOPIC SIGMOID COLECTOMY;  Surgeon: Morene Olives, MD;  Location: [facility];  Service: General;  Laterality: N/A;   OOPHORECTOMY     LSO   POLYPECTOMY N/A 07/13/2015   Procedure: POLYPECTOMY;  Surgeon: Lamar CHRISTELLA Hollingshead, MD;  Location: [facility];  Service: Endoscopy;  Laterality: N/A;   TONSILLECTOMY      Current Outpatient Medications on File Prior to Visit  Medication Sig Dispense Refill   Acetaminophen  (TYLENOL  PO)  Take by mouth.     baclofen (LIORESAL) 10 MG tablet Take 10 mg by mouth 2 (two) times daily as needed.     Linaclotide  (LINZESS ) 145 MCG CAPS capsule Take 1 capsule (145 mcg total) by mouth daily. On empty stomach. 30 capsule 5   LORazepam  (ATIVAN ) 1 MG tablet Take 1 mg by mouth 3 (three) times daily as needed for anxiety.      ondansetron  (ZOFRAN ) 4 MG tablet Take 4 mg by mouth 3 (three) times daily.   2   promethazine  (PHENERGAN ) 25 MG tablet Take 25 mg by mouth every 6 (six) hours as needed for nausea or vomiting.      RABEprazole (ACIPHEX) 20 MG tablet Take 20 mg by mouth daily.     alendronate  (FOSAMAX ) 70 MG tablet Take 1 tablet (70 mg total) by mouth every 7 (seven) days. Take with a full glass of water  on an empty stomach. (Patient not taking: Reported on 06/03/2024) 4 tablet 11   butalbital -acetaminophen -caffeine  (FIORICET, ESGIC) 50-325-40 MG per tablet Take by mouth 2 (two) times daily as needed for headache. ONLY AS NEEDED FOR MIGRAINES (Patient not taking: Reported on 06/03/2024)     diltiazem 2 % GEL Apply 1 application topically 2 (two) times daily as needed (fisures). (Patient not taking: Reported on 06/03/2024)     estradiol  (ESTRACE ) 0.1 MG/GM vaginal cream Place 0.5 g vaginally 2 (two) times a week. Place 0.5g nightly for two weeks then twice a week after (Patient not taking: Reported on 06/03/2024) 30 g 11   hydrocortisone  (ANUSOL -HC) 2.5 % rectal cream Place 1 Application rectally 2 (two) times daily. Apply after bowel movement and at night (Patient not taking: Reported on 06/03/2024) 30 g 0   polyethylene glycol (MIRALAX  / GLYCOLAX ) packet Take 17 g by mouth daily as needed for mild constipation.  (Patient not taking: Reported on 06/03/2024)     No current facility-administered medications on file prior to visit.    Social History   Socioeconomic History   Marital status: Married    Spouse name: Not on file   Number of children: Not on file   Years of education: Not on file    Highest education level: Not on file  Occupational History   Not on file  Tobacco Use   Smoking status: Never   Smokeless tobacco: Never   Tobacco comments:    Never smoked  Vaping Use   Vaping status: Never Used  Substance and Sexual Activity   Alcohol  use: No    Alcohol /week: 0.0 standard drinks of alcohol    Drug use: No   Sexual activity: Not Currently  Partners: Male    Comment: 1st intercourse 53 yo-1 partner, hsyt 44  Other Topics Concern   Not on file  Social History Narrative   Not on file   Social Drivers of Health   Financial Resource Strain: Not on file  Food Insecurity: Not on file  Transportation Needs: Not on file  Physical Activity: Not on file  Stress: Not on file  Social Connections: Not on file  Intimate Partner Violence: Not At Risk (11/19/2022)   Received from [facility]   Humiliation, Afraid, Rape, and Kick questionnaire    Within the last year, have you been afraid of your partner or ex-partner?: No    Within the last year, have you been humiliated or emotionally abused in other ways by your partner or ex-partner?: No    Within the last year, have you been kicked, hit, slapped, or otherwise physically hurt by your partner or ex-partner?: No    Within the last year, have you been raped or forced to have any kind of sexual activity by your partner or ex-partner?: No    Family History  Problem Relation Age of Onset   Melanoma Father        deceased at 36   Diabetes Mother    Dementia Mother        deceased at 73   Diabetes Brother    Heart failure Brother    Colon cancer Neg Hx      Allergies  Allergen Reactions   Other Shortness Of Breath    Paint, smoke, dust, fumes   Dilaudid [Hydromorphone Hcl] Nausea And Vomiting    Anxiety   Prednisone Palpitations    Per pt. can take low dose.  High dose, palpitations      Patient's last menstrual period was No LMP recorded. Patient has had a hysterectomy..           Review of  Systems Alls systems reviewed and are negative.     Physical Exam Constitutional:      Appearance: Normal appearance.  Genitourinary:     Vulva and urethral meatus normal.     No lesions in the vagina.     Genitourinary Comments: Stage 2 -3 rectocele Exam tender for patient     Right Labia: No rash, lesions or skin changes.    Left Labia: No lesions, skin changes or rash.    No vaginal discharge or tenderness.     Posterior vaginal prolapse present.    Moderate vaginal atrophy present.     Right Adnexa: not tender, not palpable and no mass present.    Left Adnexa: not tender, not palpable and no mass present.    No cervical motion tenderness or discharge.     Uterus is not enlarged, tender or irregular.  Breasts:    Right: Normal.     Left: Normal.  HENT:     Head: Normocephalic.  Neck:     Thyroid : No thyroid  mass, thyromegaly or thyroid  tenderness.  Cardiovascular:     Rate and Rhythm: Normal rate and regular rhythm.     Heart sounds: Normal heart sounds, S1 normal and S2 normal.  Pulmonary:     Effort: Pulmonary effort is normal.     Breath sounds: Normal breath sounds and air entry.  Abdominal:     General: There is no distension.     Palpations: Abdomen is soft. There is no mass.     Tenderness: There is no abdominal tenderness. There is no  guarding or rebound.  Musculoskeletal:        General: Normal range of motion.     Cervical back: Full passive range of motion without pain, normal range of motion and neck supple. No tenderness.     Right lower leg: No edema.     Left lower leg: No edema.  Neurological:     Mental Status: She is alert.  Skin:    General: Skin is warm.  Psychiatric:        Mood and Affect: Mood normal.        Behavior: Behavior normal.        Thought Content: Thought content normal.  Vitals and nursing note reviewed. Exam conducted with a chaperone present.       A:         Well Woman GYN exam, memory loss, osteoporosis with history of  fracture, unstable gait, rectocele                             P:        Pap smear not indicated Encouraged annual mammogram screening Colon cancer screening not indicated DXA ordered today Labs and immunizations to do with PMD Discussed breast self exams Encouraged healthy lifestyle practices Encouraged Vit D and Calcium  Referral for evenity followed by prolia.  Counseled on the r/b/a/I with her and daughter and they both agree.  PA sent. Discussed can take 30-60 days to hear back from insurance. To repeat dxa in the meantime and try to get stat since treatment is indicated asap. Referral to urogyn for rectocele management No follow-ups on file.  Kaitlin May

## 2024-06-04 LAB — URINALYSIS
Bilirubin Urine: NEGATIVE
Glucose, UA: NEGATIVE
Ketones, ur: NEGATIVE
Nitrite: NEGATIVE
Protein, ur: NEGATIVE
Specific Gravity, Urine: 1.01 (ref 1.001–1.035)
pH: 6 (ref 5.0–8.0)

## 2024-06-04 LAB — URINE CULTURE
MICRO NUMBER:: 16712057
SPECIMEN QUALITY:: ADEQUATE

## 2024-06-21 ENCOUNTER — Other Ambulatory Visit: Payer: Self-pay | Admitting: *Deleted

## 2024-06-21 DIAGNOSIS — M810 Age-related osteoporosis without current pathological fracture: Secondary | ICD-10-CM

## 2024-06-21 MED ORDER — ROMOSOZUMAB-AQQG 105 MG/1.17ML ~~LOC~~ SOSY: 210.0000 mg | PREFILLED_SYRINGE | Freq: Once | SUBCUTANEOUS | Status: DC

## 2024-07-21 ENCOUNTER — Encounter: Payer: Self-pay | Admitting: *Deleted

## 2024-07-21 DIAGNOSIS — I10 Essential (primary) hypertension: Secondary | ICD-10-CM | POA: Diagnosis not present

## 2024-07-21 DIAGNOSIS — F419 Anxiety disorder, unspecified: Secondary | ICD-10-CM | POA: Diagnosis not present

## 2024-07-21 DIAGNOSIS — M545 Low back pain, unspecified: Secondary | ICD-10-CM | POA: Diagnosis not present

## 2024-07-21 DIAGNOSIS — R110 Nausea: Secondary | ICD-10-CM | POA: Diagnosis not present

## 2024-07-21 DIAGNOSIS — N1831 Chronic kidney disease, stage 3a: Secondary | ICD-10-CM | POA: Diagnosis not present

## 2024-07-21 DIAGNOSIS — Z299 Encounter for prophylactic measures, unspecified: Secondary | ICD-10-CM | POA: Diagnosis not present

## 2024-07-22 ENCOUNTER — Telehealth: Payer: Self-pay | Admitting: *Deleted

## 2024-07-30 ENCOUNTER — Encounter: Payer: Self-pay | Admitting: Obstetrics and Gynecology

## 2024-07-30 ENCOUNTER — Ambulatory Visit: Admitting: Obstetrics and Gynecology

## 2024-07-30 VITALS — BP 127/83 | HR 82

## 2024-07-30 DIAGNOSIS — K5904 Chronic idiopathic constipation: Secondary | ICD-10-CM

## 2024-07-30 DIAGNOSIS — N816 Rectocele: Secondary | ICD-10-CM | POA: Diagnosis not present

## 2024-07-30 NOTE — Progress Notes (Signed)
 [facility] Return Visit  SUBJECTIVE  History of Present Illness: Kaitlin May is a 79 y.o. female seen in follow-up for stage II POP. She previously tried a pessary but was not comfortable with the placement.   Has noticed the prolapse has been worsening recently. She is not interested in trying a pessary and wants to consider surgery.   Has not been taking linzess  every day because it causes some diarrhea. Tried lower doses but they didn't work for her. Maybe has bowel movements 3 times per week. Not taking anything else for her bowels. PCP is prescribing the medication, has not seen GI recently.   Does not have any issues with urinary incontinence or urgency/ frequency. She occasionally has some bowel leakage if she is constipated and trying to have a bowel movement.   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 difficile colitis, Diverticula of colon, Diverticulitis ( ), GERD (gastroesophageal reflux disease), HTN (hypertension), Migraines, Osteoporosis (11/2016), Rectal fistula, and Seasonal allergies.   Past Surgical History: She  has a past surgical history that includes Tonsillectomy; Appendectomy; Colonoscopy (03/25/2011); Oophorectomy; Abdominal hysterectomy; Colonoscopy with propofol  (N/A, 07/13/2015); Esophagogastroduodenoscopy (egd) with propofol  (N/A, 07/13/2015); Esophageal dilation (N/A, 07/13/2015); polypectomy (N/A, 07/13/2015); Laparoscopic sigmoid colectomy (N/A, 05/10/2016); Anal Rectal manometry (N/A, 11/06/2016); and Colon surgery (05/11/2023).   Medications: She has a current medication list which includes the following prescription(s): acetaminophen , alendronate , baclofen, ibuprofen, linaclotide , lorazepam , ondansetron , polyethylene glycol, promethazine , and rabeprazole, and the following Facility-Administered Medications: romosozumab -aqqg.   Allergies: Patient is  allergic to other, dilaudid [hydromorphone hcl], and prednisone.   Social History: Patient  reports that she has never smoked. She has never used smokeless tobacco. She reports that she does not drink alcohol  and does not use drugs.     OBJECTIVE     Physical Exam: Vitals:   07/30/24 0845  BP: 127/83  Pulse: 82   Gen: No apparent distress, A&O x 3.  Detailed Urogynecologic Evaluation:  Normal external genitalia. On speculum, normal vaginal mucosa. On bimanual, no masses present.   POP-Q  -2.5                                            Aa   -2.5                                           Ba  -5                                              C   4                                            Gh  5                                            Pb  7                                            tvl   1                                            Ap  1                                            Bp                                                 D  Rectal exam: good sphincter tone, no dovetail sign, moderate distal rectocele with enterocele noted, hard stool in rectal vault    ASSESSMENT AND PLAN    Kaitlin May is a 78 y.o. with:  1. Prolapse of posterior vaginal wall   2. Chronic idiopathic constipation    - For constipation, we reviewed the importance of a better bowel regimen.  We also discussed the importance of avoiding chronic straining, as it can exacerbate her pelvic floor symptoms; we discussed treating constipation and straining prior to surgery, as postoperative straining can lead to damage to the repair and recurrence of symptoms. We discussed initiating therapy with increasing fluid intake, fiber supplementation and miralax  daily. Then she can use linzess  as needed since it causes diarrhea.   Plan for surgery: Exam under anesthesia, posterior repair and perineorrhaphy, sacrospinous ligament fixation.   - We reviewed the patient's specific anatomic and functional  findings, with the assistance of diagrams, and together finalized the above procedure. The planned surgical procedures were discussed along with the surgical risks outlined below, which were also provided on a detailed handout. Additional treatment options including expectant management, conservative management, medical management were discussed where appropriate.  We reviewed the benefits and risks of each treatment option.   General Surgical Risks: For all procedures, there are risks of bleeding, infection, damage to surrounding organs including but not limited to bowel, bladder, blood vessels, ureters and nerves, and need for further surgery if an injury were to occur. These risks are all low with minimally invasive surgery.   There are risks of numbness and weakness at any body site or buttock/rectal pain.  It is possible that baseline pain can be worsened by surgery, either with or without mesh. If surgery is vaginal, there is also a low risk of possible conversion to laparoscopy or open abdominal incision where indicated. Very rare risks include blood transfusion, blood clot, heart attack, pneumonia, or death.   There is also a risk of short-term postoperative urinary retention with need to use a catheter. About half of patients need to go home from surgery with a catheter, which is then later removed in the office. The risk of long-term need for a catheter is very low. There is also a risk of worsening of overactive bladder.   Prolapse (with or without mesh): Risk factors for surgical failure  include things that put pressure on your pelvis and  the surgical repair, including obesity, chronic cough, and heavy lifting or straining (including lifting children or adults, straining on the toilet, or lifting heavy objects such as furniture or anything weighing >25 lbs. Risks of recurrence is 20-30% with vaginal native tissue repair and a less than 10% with sacrocolpopexy with mesh.    - For preop Visit:   She is required to have a visit within 30 days of her surgery.    - Medical clearance: not required  - Anticoagulant use: No - Medicaid Hysterectomy form: n/a - Accepts blood transfusion: Yes - Expected length of stay: outpatient  Request sent for surgery scheduling.   Rosaline LOISE Caper, MD

## 2024-07-30 NOTE — Patient Instructions (Addendum)
 Constipation: Our goal is to achieve formed bowel movements daily or every-other-day.  You may need to try different combinations of the following options to find what works best for you - everybody's body works differently so feel free to adjust the dosages as needed.  Some options to help maintain bowel health include:  Dietary changes (more leafy greens, vegetables and fruits; less processed foods) Fiber supplementation (Benefiber, FiberCon, Metamucil or Psyllium). Start slow and increase gradually to full dose. Recommend taking it daily Over-the-counter agents such as: stool softeners (Docusate or Colace) and/or laxatives (Miralax , milk of magnesia) . Recommend daily fiber supplement. Power Pudding is a natural mixture that may help your constipation.  To make blend 1 cup applesauce, 1 cup wheat bran, and 3/4 cup prune juice, refrigerate and then take 1 tablespoon daily with a large glass of water  as needed.   Plan for surgery: Exam under anesthesia, posterior repair and perineorrhaphy, sacrospinous ligament fixation.

## 2024-08-11 ENCOUNTER — Ambulatory Visit (HOSPITAL_COMMUNITY)
Admission: RE | Admit: 2024-08-11 | Discharge: 2024-08-11 | Disposition: A | Discharge status: Home / Self Care | Source: Ambulatory Visit | Attending: Obstetrics and Gynecology | Admitting: Obstetrics and Gynecology

## 2024-08-11 ENCOUNTER — Encounter: Payer: Self-pay | Admitting: Obstetrics and Gynecology

## 2024-08-11 ENCOUNTER — Encounter (HOSPITAL_COMMUNITY): Payer: Self-pay

## 2024-08-11 DIAGNOSIS — M816 Localized osteoporosis [Lequesne]: Secondary | ICD-10-CM

## 2024-08-11 DIAGNOSIS — Z1231 Encounter for screening mammogram for malignant neoplasm of breast: Secondary | ICD-10-CM | POA: Diagnosis not present

## 2024-08-11 DIAGNOSIS — Z01419 Encounter for gynecological examination (general) (routine) without abnormal findings: Secondary | ICD-10-CM | POA: Diagnosis not present

## 2024-08-11 DIAGNOSIS — Z780 Asymptomatic menopausal state: Secondary | ICD-10-CM | POA: Diagnosis not present

## 2024-08-11 DIAGNOSIS — M810 Age-related osteoporosis without current pathological fracture: Secondary | ICD-10-CM | POA: Diagnosis not present

## 2024-08-11 NOTE — Telephone Encounter (Signed)
 Patient called and discussed bone density results. Was at -2.8 and -2.7 and still in osteoporosis range.  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  She agrees. Damien will manage from here.  Offered to sit down with her and her daughter.  Dr. Glennon

## 2024-08-24 ENCOUNTER — Other Ambulatory Visit: Payer: Self-pay | Admitting: Obstetrics and Gynecology

## 2024-08-24 ENCOUNTER — Encounter: Payer: Self-pay | Admitting: Obstetrics and Gynecology

## 2024-08-24 MED ORDER — EVENITY 105 MG/1.17ML ~~LOC~~ SOSY: 210.0000 mg | PREFILLED_SYRINGE | Freq: Once | SUBCUTANEOUS | 0 refills | Status: AC

## 2024-08-24 MED ORDER — DENOSUMAB 60 MG/ML ~~LOC~~ SOSY: 60.0000 mg | PREFILLED_SYRINGE | Freq: Once | SUBCUTANEOUS | 0 refills | Status: AC

## 2024-08-31 ENCOUNTER — Encounter: Payer: Self-pay | Admitting: *Deleted

## 2024-09-03 NOTE — Telephone Encounter (Signed)
 TC to pt. DOB verified. Offered dates 1/5 or 1/27. KD Pt to call me back.

## 2024-09-09 ENCOUNTER — Encounter: Payer: Self-pay | Admitting: Obstetrics and Gynecology

## 2024-11-09 ENCOUNTER — Telehealth: Payer: Self-pay

## 2024-11-09 NOTE — Telephone Encounter (Signed)
 Patient called stating she hasn't gotten to the point of a daily or every other day bowel movement. Also, she will not have her daughter to help her in January. Due to both situations, patient is looking to cancel her surgery.

## 2024-11-22 ENCOUNTER — Encounter: Admitting: Obstetrics and Gynecology

## 2024-11-25 ENCOUNTER — Ambulatory Visit: Admitting: Obstetrics and Gynecology

## 2024-12-14 ENCOUNTER — Ambulatory Visit (HOSPITAL_COMMUNITY): Admit: 2024-12-14 | Admitting: Obstetrics and Gynecology

## 2024-12-14 SURGERY — COLPORRHAPHY, POSTERIOR, FOR RECTOCELE REPAIR
Anesthesia: General

## 2025-02-01 ENCOUNTER — Encounter: Admitting: Obstetrics and Gynecology
